# Patient Record
Sex: Male | Born: 1950 | Race: White | Hispanic: No | Smoking: Current every day smoker
Health system: Southern US, Community
[De-identification: ages and names within clinical notes are randomized; demographics above are authoritative.]

## PROBLEM LIST (undated history)

## (undated) DIAGNOSIS — M71122 Other infective bursitis, left elbow: Secondary | ICD-10-CM

## (undated) DIAGNOSIS — I4819 Other persistent atrial fibrillation: Secondary | ICD-10-CM

## (undated) DIAGNOSIS — R0600 Dyspnea, unspecified: Secondary | ICD-10-CM

## (undated) DIAGNOSIS — G4700 Insomnia, unspecified: Secondary | ICD-10-CM

## (undated) DIAGNOSIS — I2510 Atherosclerotic heart disease of native coronary artery without angina pectoris: Secondary | ICD-10-CM

## (undated) DIAGNOSIS — I4891 Unspecified atrial fibrillation: Secondary | ICD-10-CM

## (undated) DIAGNOSIS — I509 Heart failure, unspecified: Secondary | ICD-10-CM

## (undated) DIAGNOSIS — M51369 Other intervertebral disc degeneration, lumbar region without mention of lumbar back pain or lower extremity pain: Secondary | ICD-10-CM

## (undated) DIAGNOSIS — I428 Other cardiomyopathies: Secondary | ICD-10-CM

## (undated) DIAGNOSIS — M5136 Other intervertebral disc degeneration, lumbar region: Secondary | ICD-10-CM

## (undated) DIAGNOSIS — M7030 Other bursitis of elbow, unspecified elbow: Secondary | ICD-10-CM

## (undated) HISTORY — PX: DENTAL EXAMINATION UNDER ANESTHESIA: SHX1447

## (undated) HISTORY — DX: Other cardiomyopathies: I42.8

## (undated) HISTORY — PX: NO PAST SURGERIES: SHX2092

## (undated) HISTORY — DX: Other persistent atrial fibrillation: I48.19

## (undated) HISTORY — DX: Atherosclerotic heart disease of native coronary artery without angina pectoris: I25.10

---
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
## 2015-02-09 ENCOUNTER — Encounter (HOSPITAL_COMMUNITY): Payer: Self-pay | Admitting: *Deleted

## 2015-02-09 ENCOUNTER — Emergency Department (INDEPENDENT_AMBULATORY_CARE_PROVIDER_SITE_OTHER)
Admission: EM | Admit: 2015-02-09 | Discharge: 2015-02-09 | Disposition: A | Discharge status: Home / Self Care | Payer: Self-pay | Source: Home / Self Care | Attending: Family Medicine | Admitting: Family Medicine

## 2015-02-09 DIAGNOSIS — J40 Bronchitis, not specified as acute or chronic: Secondary | ICD-10-CM

## 2015-02-09 DIAGNOSIS — K5900 Constipation, unspecified: Secondary | ICD-10-CM

## 2015-02-09 LAB — OCCULT BLOOD, POC DEVICE: Fecal Occult Bld: NEGATIVE

## 2015-02-09 MED ORDER — PREDNISONE 50 MG PO TABS: 50.0000 mg | ORAL_TABLET | Freq: Every day | ORAL | Status: DC

## 2015-02-09 MED ORDER — POLYETHYLENE GLYCOL 3350 17 GM/SCOOP PO POWD: 17.0000 g | Freq: Every day | ORAL | Status: DC

## 2015-02-09 NOTE — ED Provider Notes (Signed)
Sean Barnett is a 64 y.o. male who presents to Urgent Care today for cough congestion shortness of breath present for 10 days. No vomiting or diarrhea. Patient does note some wheezing. He has not tried any medications. He is stop smoking during the duration of his illness. Recently he's developed nausea and constipation.  Patient has never had a colonoscopy. He has not had a bowel movement in the last week. He has not tried medications   History reviewed. No pertinent past medical history. History reviewed. No pertinent past surgical history. History  Substance Use Topics  . Smoking status: Current Every Day Smoker  . Smokeless tobacco: Not on file  . Alcohol Use: Yes   ROS as above Medications: No current facility-administered medications for this encounter.   Current Outpatient Prescriptions  Medication Sig Dispense Refill  . polyethylene glycol powder (GLYCOLAX/MIRALAX) powder Take 17 g by mouth daily. 850 g 1  . predniSONE (DELTASONE) 50 MG tablet Take 1 tablet (50 mg total) by mouth daily. 5 tablet 0   Allergies  Allergen Reactions  . Sulfa Antibiotics      Exam:  BP 142/87 mmHg  Pulse 58  Temp(Src) 98.2 F (36.8 C) (Oral)  Resp 16  SpO2 97% Gen: Well NAD HEENT: EOMI,  MMM Lungs: Normal work of breathing.Wheezing present bilaterally  Heart: RRR no MRG Abd: NABS, Soft. Nondistended, Nontender Exts: Brisk capillary refill, warm and well perfused.  Rectal exam: Normal appearing anus. No rectal masses or hard stool palpated. Prostate is nontender.  Results for orders placed or performed during the hospital encounter of 02/09/15 (from the past 24 hour(s))  Occult blood, poc device     Status: None   Collection Time: 02/09/15 12:08 PM  Result Value Ref Range   Fecal Occult Bld NEGATIVE NEGATIVE   No results found.  Assessment and Plan: 64 y.o. male with  1) bronchitis: Treat with prednisone. Discontinue smoking 2) constipation:  constipation versus obstruction.  Treat with MiraLAX. Follow-up with GI. Present to ED if worsening.   Discussed warning signs or symptoms. Please see discharge instructions. Patient expresses understanding.     Rodolph Bong, MD 02/09/15 [phone]

## 2015-02-09 NOTE — Discharge Instructions (Signed)
Thank you for coming in today. Call or go to the emergency room if you get worse, have trouble breathing, have chest pains, or palpitations.  If your belly pain worsens, or you have high fever, bad vomiting, blood in your stool or black tarry stool go to the Emergency Room.   Please quit smoking.   Follow up with the stomach doctors and a primary doctor.    Constipation Constipation is when a person has fewer than three bowel movements a week, has difficulty having a bowel movement, or has stools that are dry, hard, or larger than normal. As people grow older, constipation is more common. If you try to fix constipation with medicines that make you have a bowel movement (laxatives), the problem may get worse. Long-term laxative use may cause the muscles of the colon to become weak. A low-fiber diet, not taking in enough fluids, and taking certain medicines may make constipation worse.  CAUSES   Certain medicines, such as antidepressants, pain medicine, iron supplements, antacids, and water pills.   Certain diseases, such as diabetes, irritable bowel syndrome (IBS), thyroid disease, or depression.   Not drinking enough water.   Not eating enough fiber-rich foods.   Stress or travel.   Lack of physical activity or exercise.   Ignoring the urge to have a bowel movement.   Using laxatives too much.  SIGNS AND SYMPTOMS   Having fewer than three bowel movements a week.   Straining to have a bowel movement.   Having stools that are hard, dry, or larger than normal.   Feeling full or bloated.   Pain in the lower abdomen.   Not feeling relief after having a bowel movement.  DIAGNOSIS  Your health care provider will take a medical history and perform a physical exam. Further testing may be done for severe constipation. Some tests may include:  A barium enema X-ray to examine your rectum, colon, and, sometimes, your small intestine.   A sigmoidoscopy to examine your  lower colon.   A colonoscopy to examine your entire colon. TREATMENT  Treatment will depend on the severity of your constipation and what is causing it. Some dietary treatments include drinking more fluids and eating more fiber-rich foods. Lifestyle treatments may include regular exercise. If these diet and lifestyle recommendations do not help, your health care provider may recommend taking over-the-counter laxative medicines to help you have bowel movements. Prescription medicines may be prescribed if over-the-counter medicines do not work.  HOME CARE INSTRUCTIONS   Eat foods that have a lot of fiber, such as fruits, vegetables, whole grains, and beans.  Limit foods high in fat and processed sugars, such as french fries, hamburgers, cookies, candies, and soda.   A fiber supplement may be added to your diet if you cannot get enough fiber from foods.   Drink enough fluids to keep your urine clear or pale yellow.   Exercise regularly or as directed by your health care provider.   Go to the restroom when you have the urge to go. Do not hold it.   Only take over-the-counter or prescription medicines as directed by your health care provider. Do not take other medicines for constipation without talking to your health care provider first.  SEEK IMMEDIATE MEDICAL CARE IF:   You have bright red blood in your stool.   Your constipation lasts for more than 4 days or gets worse.   You have abdominal or rectal pain.   You have thin, pencil-like stools.  You have unexplained weight loss. MAKE SURE YOU:   Understand these instructions.  Will watch your condition.  Will get help right away if you are not doing well or get worse. Document Released: 08/14/2004 Document Revised: 11/21/2013 Document Reviewed: 08/28/2013 [facility] Patient Information 2015 [place], [place]. This information is not intended to replace advice given to you by your health care provider. Make sure you discuss  any questions you have with your health care provider.   Acute Bronchitis Bronchitis is inflammation of the airways that extend from the windpipe into the lungs (bronchi). The inflammation often causes mucus to develop. This leads to a cough, which is the most common symptom of bronchitis.  In acute bronchitis, the condition usually develops suddenly and goes away over time, usually in a couple weeks. Smoking, allergies, and asthma can make bronchitis worse. Repeated episodes of bronchitis may cause further lung problems.  CAUSES Acute bronchitis is most often caused by the same virus that causes a cold. The virus can spread from person to person (contagious) through coughing, sneezing, and touching contaminated objects. SIGNS AND SYMPTOMS   Cough.   Fever.   Coughing up mucus.   Body aches.   Chest congestion.   Chills.   Shortness of breath.   Sore throat.  DIAGNOSIS  Acute bronchitis is usually diagnosed through a physical exam. Your health care provider will also ask you questions about your medical history. Tests, such as chest X-rays, are sometimes done to rule out other conditions.  TREATMENT  Acute bronchitis usually goes away in a couple weeks. Oftentimes, no medical treatment is necessary. Medicines are sometimes given for relief of fever or cough. Antibiotic medicines are usually not needed but may be prescribed in certain situations. In some cases, an inhaler may be recommended to help reduce shortness of breath and control the cough. A cool mist vaporizer may also be used to help thin bronchial secretions and make it easier to clear the chest.  HOME CARE INSTRUCTIONS  Get plenty of rest.   Drink enough fluids to keep your urine clear or pale yellow (unless you have a medical condition that requires fluid restriction). Increasing fluids may help thin your respiratory secretions (sputum) and reduce chest congestion, and it will prevent dehydration.   Take  medicines only as directed by your health care provider.  If you were prescribed an antibiotic medicine, finish it all even if you start to feel better.  Avoid smoking and secondhand smoke. Exposure to cigarette smoke or irritating chemicals will make bronchitis worse. If you are a smoker, consider using nicotine gum or skin patches to help control withdrawal symptoms. Quitting smoking will help your lungs heal faster.   Reduce the chances of another bout of acute bronchitis by washing your hands frequently, avoiding people with cold symptoms, and trying not to touch your hands to your mouth, nose, or eyes.   Keep all follow-up visits as directed by your health care provider.  SEEK MEDICAL CARE IF: Your symptoms do not improve after 1 week of treatment.  SEEK IMMEDIATE MEDICAL CARE IF:  You develop an increased fever or chills.   You have chest pain.   You have severe shortness of breath.  You have bloody sputum.   You develop dehydration.  You faint or repeatedly feel like you are going to pass out.  You develop repeated vomiting.  You develop a severe headache. MAKE SURE YOU:   Understand these instructions.  Will watch your condition.  Will get  help right away if you are not doing well or get worse. Document Released: 12/24/2004 Document Revised: 04/02/2014 Document Reviewed: 05/09/2013 [facility] Patient Information 2015 [place], [place]. This information is not intended to replace advice given to you by your health care provider. Make sure you discuss any questions you have with your health care provider.   PRIMARY CARE Merchant navy officer at [employer] [address]  [place], [place] [phone]  Fax [phone]  Nature conservation officer at [facility] [address]  [place], [place] [phone]  Fax [phone]  Nature conservation officer at [facility] [phone],  [place] [phone]  Fax [phone]  [facility] at [facility] [address]  [place], [place] [phone]  Fax [phone]  [employer] [facility] [address] [place] [address] [place], [place] [phone]  Fax [phone]  [facility] at [facility] [address] [place], [place] [phone]  Fax [phone]   [facility] Medicine @ [place] [address] [place] [address] Phone: [phone]   [facility] Medicine @ [facility] [address] [place] [address] Phone: [phone]   [facility] Medicine @ [place] [address] [place] [address] Phone: [phone]   [facility] Medicine @ [place] [address] [place] [address] Phone: [phone]   [facility] Medicine @ [place] [address] [place] [address] Phone: [phone] Fax: [phone]   [facility] @ [place] [address] [place] [address] Phone: [phone]   Dr. Maryelizabeth Rowan [address] [place] [address] [phone]

## 2015-02-09 NOTE — ED Notes (Signed)
Pt   Reports  About  10 days  Ago  of  Developed   Chills  And diarrhea    -   For  approx  1  wek  He  Has  Nausea   decreased  Appetite            As   Well   As  A  Cough         Pt   States  He  Has  Not  Had  A  bm  In  About  1  Week  As  Well    He  denys  Any  Pain  Except  When he  Coughs     -   He  Is  Sitting  Upright   On  Exam table speaking in  Complete  sentances

## 2016-04-14 ENCOUNTER — Ambulatory Visit (INDEPENDENT_AMBULATORY_CARE_PROVIDER_SITE_OTHER): Payer: Self-pay

## 2016-04-14 ENCOUNTER — Encounter (HOSPITAL_COMMUNITY): Payer: Self-pay | Admitting: *Deleted

## 2016-04-14 ENCOUNTER — Ambulatory Visit (HOSPITAL_COMMUNITY)
Admission: EM | Admit: 2016-04-14 | Discharge: 2016-04-14 | Disposition: A | Discharge status: Home / Self Care | Payer: Self-pay | Attending: Family Medicine | Admitting: Family Medicine

## 2016-04-14 DIAGNOSIS — M5137 Other intervertebral disc degeneration, lumbosacral region: Secondary | ICD-10-CM

## 2016-04-14 MED ORDER — MELOXICAM 7.5 MG PO TABS: 7.5000 mg | ORAL_TABLET | Freq: Two times a day (BID) | ORAL | Status: DC

## 2016-04-14 NOTE — Discharge Instructions (Signed)
Use medicine as prescribed and see dr Ophelia Charter for further back care and eval.

## 2016-04-14 NOTE — ED Notes (Signed)
Pt  Reports  Pain  r  Hip  radaiting  Down  r     Leg     Pt   Reports        He  Slept  On  An  Ice  Pack  sev  Days  Ago  And  That  May he  aggrevated  The  symptms     pt  denys  Falling    Or  Any  specefic  Traumatic  Injury      Pt  Is  Able  To  Ambulate         Pt  Has  No   Primary  Care  Provider

## 2016-04-14 NOTE — ED Provider Notes (Signed)
CSN: [id number]     Arrival date & time 04/14/16  1310 History   First MD Initiated Contact with Patient 04/14/16 1326     Chief Complaint  Patient presents with  . Hip Pain   (Consider location/radiation/quality/duration/timing/severity/associated sxs/prior Treatment) Patient is a 65 y.o. male presenting with back pain. The history is provided by the patient.  Back Pain Location:  Lumbar spine Quality:  Shooting Radiates to:  R thigh Pain severity:  Mild Onset quality:  Gradual Duration:  4 days Progression:  Unchanged Chronicity:  New Context: twisting   Relieved by:  None tried Worsened by:  Nothing tried Ineffective treatments:  None tried Associated symptoms: leg pain and weakness   Associated symptoms: no abdominal pain, no bladder incontinence, no bowel incontinence, no dysuria, no fever, no numbness, no paresthesias, no tingling and no weight loss     History reviewed. No pertinent past medical history. History reviewed. No pertinent past surgical history. History reviewed. No pertinent family history. Social History  Substance Use Topics  . Smoking status: Current Every Day Smoker  . Smokeless tobacco: None  . Alcohol Use: Yes    Review of Systems  Constitutional: Negative for fever and weight loss.  Gastrointestinal: Negative.  Negative for abdominal pain and bowel incontinence.  Genitourinary: Negative.  Negative for bladder incontinence and dysuria.  Musculoskeletal: Positive for back pain. Negative for joint swelling and gait problem.  Neurological: Positive for weakness. Negative for tingling, numbness and paresthesias.  All other systems reviewed and are negative.   Allergies  Sulfa antibiotics  Home Medications   Prior to Admission medications   Medication Sig Start Date End Date Taking? Authorizing Provider  meloxicam (MOBIC) 7.5 MG tablet Take 1 tablet (7.5 mg total) by mouth 2 (two) times daily after a meal. 04/14/16   Linna Hoff, MD   polyethylene glycol powder (GLYCOLAX/MIRALAX) powder Take 17 g by mouth daily. 02/09/15   Rodolph Bong, MD  predniSONE (DELTASONE) 50 MG tablet Take 1 tablet (50 mg total) by mouth daily. 02/09/15   Rodolph Bong, MD   Meds Ordered and Administered this Visit  Medications - No data to display  BP 152/99 mmHg  Pulse 53  Temp(Src) 97.8 F (36.6 C) (Oral)  Resp 20  SpO2 97% No data found.   Physical Exam  Constitutional: He is oriented to person, place, and time. He appears well-developed and well-nourished. No distress.  Neck: Normal range of motion. Neck supple.  Abdominal: Soft. Bowel sounds are normal. There is no tenderness.  Musculoskeletal: He exhibits tenderness.  Pain and weakness right ant thigh v. Left.  Lymphadenopathy:    He has no cervical adenopathy.  Neurological: He is alert and oriented to person, place, and time. No cranial nerve deficit. Coordination normal.  Skin: Skin is warm and dry.  Nursing note and vitals reviewed.   ED Course  Procedures (including critical care time)  Labs Review Labs Reviewed - No data to display  Imaging Review Dg Lumbar Spine Complete  04/14/2016  CLINICAL DATA:  Lumbago with radicular symptoms to the right lower extremity EXAM: LUMBAR SPINE - COMPLETE 4+ VIEW COMPARISON:  None. FINDINGS: Frontal, lateral, spot lumbosacral lateral, and bilateral oblique views were obtained. There are 5 non-rib-bearing lumbar type vertebral bodies. There is no fracture or spondylolisthesis. There is moderately severe disc space narrowing at L5-S1. There is moderate narrowing at L1-2 and L2-3. There is mild disc space narrowing at L3-4 and L4-5. There is facet osteoarthritic change  to varying degrees at all levels, most notably at L4-5 and L5-S1 bilaterally. There is atherosclerotic calcification in the aorta. There appears to be mild widening of the mid aorta, possibly representing mild aneurysmal dilatation in this area. IMPRESSION: Multilevel  arthropathy. Disc space narrowing is most marked at L5-S1. No fracture or spondylolisthesis. Atherosclerotic calcification noted. Question mild widening in the mid aorta. This finding may warrant ultrasound of the aorta to further evaluate for possible degree of aneurysmal dilatation in the aorta. Electronically Signed   By: Bretta Bang III M.D.   On: 04/14/2016 14:19   X-rays reviewed and report per radiologist.   Visual Acuity Review  Right Eye Distance:   Left Eye Distance:   Bilateral Distance:    Right Eye Near:   Left Eye Near:    Bilateral Near:         MDM   1. DDD (degenerative disc disease), lumbosacral        Linna Hoff, MD 04/14/16 1426

## 2019-03-01 DIAGNOSIS — M7030 Other bursitis of elbow, unspecified elbow: Secondary | ICD-10-CM

## 2019-03-01 HISTORY — DX: Other bursitis of elbow, unspecified elbow: M70.30

## 2019-03-24 ENCOUNTER — Inpatient Hospital Stay (HOSPITAL_COMMUNITY): Payer: Medicaid Other

## 2019-03-24 ENCOUNTER — Inpatient Hospital Stay (HOSPITAL_COMMUNITY)
Admission: EM | Admit: 2019-03-24 | Discharge: 2019-03-26 | DRG: 309 | Disposition: A | Discharge status: Home / Self Care | Payer: Medicaid Other | Attending: Internal Medicine | Admitting: Internal Medicine

## 2019-03-24 ENCOUNTER — Encounter (HOSPITAL_COMMUNITY): Payer: Self-pay | Admitting: Emergency Medicine

## 2019-03-24 ENCOUNTER — Ambulatory Visit (HOSPITAL_COMMUNITY)
Admission: EM | Admit: 2019-03-24 | Discharge: 2019-03-24 | Disposition: A | Discharge status: Home / Self Care | Payer: Self-pay

## 2019-03-24 ENCOUNTER — Emergency Department (HOSPITAL_COMMUNITY): Payer: Medicaid Other

## 2019-03-24 ENCOUNTER — Other Ambulatory Visit: Payer: Self-pay

## 2019-03-24 DIAGNOSIS — R000 Tachycardia, unspecified: Secondary | ICD-10-CM

## 2019-03-24 DIAGNOSIS — B9561 Methicillin susceptible Staphylococcus aureus infection as the cause of diseases classified elsewhere: Secondary | ICD-10-CM | POA: Diagnosis not present

## 2019-03-24 DIAGNOSIS — Z7952 Long term (current) use of systemic steroids: Secondary | ICD-10-CM

## 2019-03-24 DIAGNOSIS — M7989 Other specified soft tissue disorders: Secondary | ICD-10-CM

## 2019-03-24 DIAGNOSIS — F17210 Nicotine dependence, cigarettes, uncomplicated: Secondary | ICD-10-CM | POA: Diagnosis present

## 2019-03-24 DIAGNOSIS — I110 Hypertensive heart disease with heart failure: Secondary | ICD-10-CM | POA: Diagnosis present

## 2019-03-24 DIAGNOSIS — Z598 Other problems related to housing and economic circumstances: Secondary | ICD-10-CM

## 2019-03-24 DIAGNOSIS — Z881 Allergy status to other antibiotic agents status: Secondary | ICD-10-CM | POA: Diagnosis not present

## 2019-03-24 DIAGNOSIS — I4891 Unspecified atrial fibrillation: Principal | ICD-10-CM | POA: Diagnosis present

## 2019-03-24 DIAGNOSIS — I5020 Unspecified systolic (congestive) heart failure: Secondary | ICD-10-CM | POA: Diagnosis present

## 2019-03-24 DIAGNOSIS — R0602 Shortness of breath: Secondary | ICD-10-CM | POA: Diagnosis not present

## 2019-03-24 DIAGNOSIS — Z800 Family history of malignant neoplasm of digestive organs: Secondary | ICD-10-CM | POA: Diagnosis not present

## 2019-03-24 DIAGNOSIS — R079 Chest pain, unspecified: Secondary | ICD-10-CM | POA: Diagnosis not present

## 2019-03-24 DIAGNOSIS — M7032 Other bursitis of elbow, left elbow: Secondary | ICD-10-CM

## 2019-03-24 DIAGNOSIS — Z79899 Other long term (current) drug therapy: Secondary | ICD-10-CM

## 2019-03-24 DIAGNOSIS — M71122 Other infective bursitis, left elbow: Secondary | ICD-10-CM

## 2019-03-24 DIAGNOSIS — I340 Nonrheumatic mitral (valve) insufficiency: Secondary | ICD-10-CM | POA: Diagnosis not present

## 2019-03-24 DIAGNOSIS — Z720 Tobacco use: Secondary | ICD-10-CM | POA: Diagnosis not present

## 2019-03-24 DIAGNOSIS — I10 Essential (primary) hypertension: Secondary | ICD-10-CM

## 2019-03-24 DIAGNOSIS — Z7901 Long term (current) use of anticoagulants: Secondary | ICD-10-CM | POA: Diagnosis not present

## 2019-03-24 HISTORY — DX: Other bursitis of elbow, unspecified elbow: M70.30

## 2019-03-24 HISTORY — DX: Unspecified atrial fibrillation: I48.91

## 2019-03-24 HISTORY — DX: Other intervertebral disc degeneration, lumbar region without mention of lumbar back pain or lower extremity pain: M51.369

## 2019-03-24 HISTORY — DX: Other intervertebral disc degeneration, lumbar region: M51.36

## 2019-03-24 LAB — CBC WITH DIFFERENTIAL/PLATELET
Abs Immature Granulocytes: 0.04 10*3/uL (ref 0.00–0.07)
Basophils Absolute: 0.1 10*3/uL (ref 0.0–0.1)
Basophils Relative: 1 %
Eosinophils Absolute: 0.4 10*3/uL (ref 0.0–0.5)
Eosinophils Relative: 4 %
HCT: 41.2 % (ref 39.0–52.0)
Hemoglobin: 13.1 g/dL (ref 13.0–17.0)
Immature Granulocytes: 0 %
Lymphocytes Relative: 20 %
Lymphs Abs: 2.1 10*3/uL (ref 0.7–4.0)
MCH: 29.4 pg (ref 26.0–34.0)
MCHC: 31.8 g/dL (ref 30.0–36.0)
MCV: 92.4 fL (ref 80.0–100.0)
Monocytes Absolute: 0.5 10*3/uL (ref 0.1–1.0)
Monocytes Relative: 5 %
Neutro Abs: 7.2 10*3/uL (ref 1.7–7.7)
Neutrophils Relative %: 70 %
Platelets: 332 10*3/uL (ref 150–400)
RBC: 4.46 MIL/uL (ref 4.22–5.81)
RDW: 15 % (ref 11.5–15.5)
WBC: 10.3 10*3/uL (ref 4.0–10.5)
nRBC: 0 % (ref 0.0–0.2)

## 2019-03-24 LAB — COMPREHENSIVE METABOLIC PANEL
ALT: 30 U/L (ref 0–44)
AST: 28 U/L (ref 15–41)
Albumin: 3.6 g/dL (ref 3.5–5.0)
Alkaline Phosphatase: 134 U/L — ABNORMAL HIGH (ref 38–126)
Anion gap: 14 (ref 5–15)
BUN: 14 mg/dL (ref 8–23)
CO2: 21 mmol/L — ABNORMAL LOW (ref 22–32)
Calcium: 9.2 mg/dL (ref 8.9–10.3)
Chloride: 104 mmol/L (ref 98–111)
Creatinine, Ser: 1.14 mg/dL (ref 0.61–1.24)
GFR calc Af Amer: >60 mL/min (ref >60)
GFR calc non Af Amer: >60 mL/min (ref >60)
Glucose, Bld: 117 mg/dL — ABNORMAL HIGH (ref 70–99)
Potassium: 4.4 mmol/L (ref 3.5–5.1)
Sodium: 139 mmol/L (ref 135–145)
Total Bilirubin: 1.1 mg/dL (ref 0.3–1.2)
Total Protein: 8.1 g/dL (ref 6.5–8.1)

## 2019-03-24 LAB — MRSA PCR SCREENING: MRSA by PCR: NEGATIVE

## 2019-03-24 LAB — BRAIN NATRIURETIC PEPTIDE: B Natriuretic Peptide: 756.6 pg/mL — ABNORMAL HIGH (ref 0.0–100.0)

## 2019-03-24 LAB — TROPONIN I: Troponin I: <0.03 ng/mL (ref <0.03)

## 2019-03-24 LAB — TSH: TSH: 2.45 u[IU]/mL (ref 0.350–4.500)

## 2019-03-24 MED ORDER — ACETAMINOPHEN 650 MG RE SUPP: 650.0000 mg | Freq: Four times a day (QID) | RECTAL | Status: DC | PRN

## 2019-03-24 MED ORDER — HEPARIN BOLUS VIA INFUSION
5500.0000 [IU] | Freq: Once | INTRAVENOUS | Status: DC
  Filled 2019-03-24: qty 5500

## 2019-03-24 MED ORDER — APIXABAN 5 MG PO TABS
5.0000 mg | ORAL_TABLET | Freq: Two times a day (BID) | ORAL | Status: DC
  Administered 2019-03-25 – 2019-03-26 (×3): 5 mg via ORAL
  Filled 2019-03-24 (×4): qty 1

## 2019-03-24 MED ORDER — CEFAZOLIN SODIUM-DEXTROSE 2-4 GM/100ML-% IV SOLN
2.0000 g | Freq: Three times a day (TID) | INTRAVENOUS | Status: DC
  Administered 2019-03-24 – 2019-03-26 (×5): 2 g via INTRAVENOUS
  Filled 2019-03-24 (×9): qty 100

## 2019-03-24 MED ORDER — DILTIAZEM LOAD VIA INFUSION
10.0000 mg | Freq: Once | INTRAVENOUS | Status: AC
  Administered 2019-03-24: 10 mg via INTRAVENOUS
  Filled 2019-03-24: qty 10

## 2019-03-24 MED ORDER — FUROSEMIDE 10 MG/ML IJ SOLN: 20.0000 mg | Freq: Once | INTRAMUSCULAR | Status: DC

## 2019-03-24 MED ORDER — SODIUM CHLORIDE 0.9% FLUSH
3.0000 mL | Freq: Two times a day (BID) | INTRAVENOUS | Status: DC
  Administered 2019-03-25 – 2019-03-26 (×3): 3 mL via INTRAVENOUS

## 2019-03-24 MED ORDER — POLYETHYLENE GLYCOL 3350 17 G PO PACK: 17.0000 g | PACK | Freq: Every day | ORAL | Status: DC | PRN

## 2019-03-24 MED ORDER — DILTIAZEM HCL-DEXTROSE 100-5 MG/100ML-% IV SOLN (PREMIX)
5.0000 mg/h | INTRAVENOUS | Status: DC
  Administered 2019-03-24: 15 mg/h via INTRAVENOUS
  Administered 2019-03-24: 10 mg/h via INTRAVENOUS
  Administered 2019-03-24: 5 mg/h via INTRAVENOUS
  Administered 2019-03-24 – 2019-03-25 (×2): 15 mg/h via INTRAVENOUS
  Filled 2019-03-24 (×3): qty 100

## 2019-03-24 MED ORDER — ACETAMINOPHEN 325 MG PO TABS: 650.0000 mg | ORAL_TABLET | Freq: Four times a day (QID) | ORAL | Status: DC | PRN

## 2019-03-24 MED ORDER — HEPARIN (PORCINE) 25000 UT/250ML-% IV SOLN: 1500.0000 [IU]/h | INTRAVENOUS | Status: DC

## 2019-03-24 NOTE — ED Triage Notes (Addendum)
Pt presents to [facility] for assessment of large area of redness, swelling and pain to the left elbow x 1 week

## 2019-03-24 NOTE — ED Provider Notes (Signed)
[facility] EMERGENCY DEPARTMENT Provider Note   CSN: [id number] Arrival date & time: 03/24/19  2197    History   Chief Complaint Chief Complaint  Patient presents with  . Shortness of Breath  . Wound Infection    left elbow    HPI Sean Barnett is a 68 y.o. male.     HPI Patient sent from urgent care.  Has had left elbow pain and swelling over the last around 11 days.  States he thinks he hit it on something initially.  However he had had swelling of his left forearm 2.  Now the left elbow is been draining for the last 3 or 4 days.  Has had purulent drainage.  States the swelling of the forearm is better however. Has had more shortness of breath.  Normally sleeps on his belly and states he is been having more problems with that.  States her last couple days has felt his heart going fast although he has had issues with his heart going fast in the past who is felt episodes over this over the last year.  He is a smoker.  No chest pain.  Does have swelling in his legs which he has had for years also.  No weight loss.  Does not see a doctor.  High blood pressure at the urgent care along with an irregular tachycardia.  No difficulty moving the left elbow. History reviewed. No pertinent past medical history.  There are no active problems to display for this patient.   No past surgical history on file.      Home Medications    Prior to Admission medications   Medication Sig Start Date End Date Taking? Authorizing Provider  meloxicam (MOBIC) 7.5 MG tablet Take 1 tablet (7.5 mg total) by mouth 2 (two) times daily after a meal. 04/14/16   Kindl, Quita Skye, MD  polyethylene glycol powder (GLYCOLAX/MIRALAX) powder Take 17 g by mouth daily. 02/09/15   Rodolph Bong, MD  predniSONE (DELTASONE) 50 MG tablet Take 1 tablet (50 mg total) by mouth daily. 02/09/15   Rodolph Bong, MD    Family History History reviewed. No pertinent family history.  Social History Social  History   Tobacco Use  . Smoking status: Current Every Day Smoker    Packs/day: 1.00  . Smokeless tobacco: Never Used  Substance Use Topics  . Alcohol use: Yes  . Drug use: Not on file     Allergies   Sulfa antibiotics   Review of Systems Review of Systems  Constitutional: Negative for appetite change.  HENT: Negative for congestion.   Respiratory: Positive for shortness of breath. Negative for cough.   Cardiovascular: Positive for leg swelling.  Gastrointestinal: Negative for abdominal pain.  Genitourinary: Negative for flank pain.  Musculoskeletal: Negative for gait problem.       Left elbow swelling.  Had had left forearm swelling.  Neurological: Negative for weakness.     Physical Exam Updated Vital Signs BP (!) 162/126 (BP Location: Right Arm)   Pulse (!) 137   Temp 97.9 F (36.6 C) (Oral)   Resp 14   Ht 6\' 4"  (1.93 m)   Wt 113.4 kg   SpO2 100%   BMI 30.43 kg/m   Physical Exam Vitals signs and nursing note reviewed.  HENT:     Head: Normocephalic.  Cardiovascular:     Comments: Irregular tachycardia Pulmonary:     Comments: Harsh breath sounds at the bases. Chest:  Chest wall: No tenderness.  Abdominal:     Tenderness: There is no abdominal tenderness.  Musculoskeletal:     Right lower leg: Edema present.     Left lower leg: Edema present.     Comments: Left elbow erythema induration and swelling.  There is an area with purulent drainage.  Some fluctuance.  Pitting edema bilateral lower extremities.  Good range of motion and movement in left elbow.  No edema distally.  Skin:    Capillary Refill: Capillary refill takes less than 2 seconds.  Neurological:     General: No focal deficit present.     Mental Status: He is alert.        ED Treatments / Results  Labs (all labs ordered are listed, but only abnormal results are displayed) Labs Reviewed  COMPREHENSIVE METABOLIC PANEL - Abnormal; Notable for the following components:      Result  Value   CO2 21 (*)    Glucose, Bld 117 (*)    Alkaline Phosphatase 134 (*)    All other components within normal limits  AEROBIC CULTURE (SUPERFICIAL SPECIMEN)  TROPONIN I  CBC WITH DIFFERENTIAL/PLATELET  TSH  BRAIN NATRIURETIC PEPTIDE    EKG EKG Interpretation  Date/Time:  Friday March 24 2019 09:47:01 EDT Ventricular Rate:  142 PR Interval:    QRS Duration: 92 QT Interval:  317 QTC Calculation: 488 R Axis:   -88 Text Interpretation:  Atrial fibrillation Left anterior fascicular block Consider right ventricular hypertrophy Nonspecific T abnormalities, lateral leads Borderline prolonged QT interval Confirmed by Benjiman Core [phone]) on 03/24/2019 10:03:34 AM   Radiology Dg Chest Portable 1 View  Result Date: 03/24/2019 CLINICAL DATA:  Shortness of breath and chest pain EXAM: PORTABLE CHEST 1 VIEW COMPARISON:  None. FINDINGS: Cardiac shadow is at the upper limits of normal in size. Lungs are well aerated bilaterally. Patchy increased density is noted in the medial aspect of the right upper lobe consistent with early infiltrate. Mild vascular congestion is noted without interstitial edema. IMPRESSION: Mild vascular congestion. Increased density in the right upper lobe medially consistent with early infiltrate. Electronically Signed   By: Alcide Clever M.D.   On: 03/24/2019 11:09   Ue Venous Duplex ([facility] Only)  Result Date: 03/24/2019 UPPER VENOUS STUDY  Indications: Swelling Performing Technologist: Blanch Media RVS  Examination Guidelines: A complete evaluation includes B-mode imaging, spectral Doppler, color Doppler, and power Doppler as needed of all accessible portions of each vessel. Bilateral testing is considered an integral part of a complete examination. Limited examinations for reoccurring indications may be performed as noted.  Right Findings: +----------+------------+---------+-----------+----------+-------+ RIGHT      CompressiblePhasicitySpontaneousPropertiesSummary +----------+------------+---------+-----------+----------+-------+ Subclavian    Full       Yes       Yes                      +----------+------------+---------+-----------+----------+-------+  Left Findings: +----------+------------+---------+-----------+----------+-------+ LEFT      CompressiblePhasicitySpontaneousPropertiesSummary +----------+------------+---------+-----------+----------+-------+ IJV           Full       Yes       Yes                      +----------+------------+---------+-----------+----------+-------+ Subclavian    Full       Yes       Yes                      +----------+------------+---------+-----------+----------+-------+ Axillary  Full       Yes       Yes                      +----------+------------+---------+-----------+----------+-------+ Brachial      Full       Yes       Yes                      +----------+------------+---------+-----------+----------+-------+ Radial        Full                                          +----------+------------+---------+-----------+----------+-------+ Ulnar         Full                                          +----------+------------+---------+-----------+----------+-------+ Cephalic      Full                                          +----------+------------+---------+-----------+----------+-------+ Basilic       Full                                          +----------+------------+---------+-----------+----------+-------+  Summary:  Right: No evidence of thrombosis in the subclavian.  Left: No evidence of deep vein thrombosis in the upper extremity. No evidence of superficial vein thrombosis in the upper extremity.  *See table(s) above for measurements and observations.    Preliminary     Procedures Procedures (including critical care time)  Medications Ordered in ED Medications  ceFAZolin (ANCEF) IVPB 2g/100  mL premix (has no administration in time range)     Initial Impression / Assessment and Plan / ED Course  I have reviewed the triage vital signs and the nursing notes.  Pertinent labs & imaging results that were available during my care of the patient were reviewed by me and considered in my medical decision making (see chart for details).       Chadsvasc of 2 for age and hypertension, although patient does not see a doctor and could easily have higher risk.   Patient presented with left elbow infection.  Appears to be septic bursitis.  Seen by Ortho and consultation given.  Antibiotics and a culture has been done.  However patient also was found to be in A. fib with RVR.  New onset.  X-ray showed potential pneumonia but does not have current cough although he did have a cough couple weeks ago.  Also has some mild vascular congestion on the x-ray.  His edema on the legs.  Will start Cardizem drip for rate control.  Admit to unassigned medicine.   CRITICAL CARE Performed by: Benjiman CoreNathan Miquel Stacks Total critical care time: 30 minutes Critical care time was exclusive of separately billable procedures and treating other patients. Critical care was necessary to treat or prevent imminent or life-threatening deterioration. Critical care was time spent personally by me on the following activities: development of treatment plan with patient and/or surrogate as well as nursing, discussions with consultants, evaluation of  patient's response to treatment, examination of patient, obtaining history from patient or surrogate, ordering and performing treatments and interventions, ordering and review of laboratory studies, ordering and review of radiographic studies, pulse oximetry and re-evaluation of patient's condition.   Final Clinical Impressions(s) / ED Diagnoses   Final diagnoses:  Atrial fibrillation with rapid ventricular response (HCC)  Septic bursitis of elbow, left    ED Discharge Orders     None       Benjiman Core, MD 03/24/19 1204

## 2019-03-24 NOTE — Progress Notes (Signed)
Paged provider about patient BP consistently elevated since admission. Awaiting orders. Provider return page and advise to continue to monitor patient BP. Possible would add additional BP medications.  RN will continue to monitor

## 2019-03-24 NOTE — ED Triage Notes (Signed)
Pt arrvies from home complaining of SOB  And elbow infection. Pt states a week ago he felt like he had bruised his elbow but it had blistered up and the skin broke. Pt sent from urgent care.

## 2019-03-24 NOTE — Progress Notes (Signed)
ANTICOAGULATION CONSULT NOTE - Initial Consult  Pharmacy Consult for heparin Indication: atrial fibrillation  Allergies  Allergen Reactions  . Sulfa Antibiotics     Patient Measurements: Height: 6\' 4"  (193 cm) Weight: 250 lb (113.4 kg) IBW/kg (Calculated) : 86.8 Heparin Dosing Weight: 110kg  Vital Signs: Temp: 97.9 F (36.6 C) (04/24 0946) Temp Source: Oral (04/24 0946) BP: 149/127 (04/24 1245) Pulse Rate: 120 (04/24 1245)  Labs: Recent Labs    03/24/19 1012  HGB 13.1  HCT 41.2  PLT 332  CREATININE 1.14  TROPONINI <0.03    Estimated Creatinine Clearance: 85.4 mL/min (by C-G formula based on SCr of 1.14 mg/dL).   Medical History: History reviewed. No pertinent past medical history.  Medications:  Infusions:  .  ceFAZolin (ANCEF) IV Stopped (03/24/19 1242)  . diltiazem (CARDIZEM) infusion 5 mg/hr (03/24/19 1248)  . heparin      Assessment: 81 yom presented to the ED with SOB and elbow infection. Found to be in afib. To start IV heparin for anticoagulation. Baseline CBC is WNL and he is not on anticoagulation PTA.   Goal of Therapy:  Heparin level 0.3-0.7 units/ml Monitor platelets by anticoagulation protocol: Yes   Plan:  Heparin bolus 5500 units IV x 1 Heparin gtt 1500 units/hr Check a 6 hr heparin level Daily heparin level and CBC  Kinslee Dalpe, Drake Leach 03/24/2019,1:04 PM

## 2019-03-24 NOTE — Progress Notes (Signed)
Heart rate elevated.  Will attempt echo later once HR stabilizes.

## 2019-03-24 NOTE — ED Notes (Signed)
After assessment by NP, patient to be sent to ER

## 2019-03-24 NOTE — ED Provider Notes (Signed)
[facility]    CSN: [id number] Arrival date & time: 03/24/19  0848     History   Chief Complaint Chief Complaint  Patient presents with  . Arm Pain    HPI Kaylup Knable is a 68 y.o. male.   Patient is a 68 year old male with no known past medical history.  He presents today with approximately 1 week or more of worsening left elbow pain, swelling and draining.  Symptoms have been persistent.  Patient reports that with his occupation he is on his elbows a lot.  He noticed some pain earlier in the week but did not think anything about it.  He has been taking ibuprofen for the pain.  Unsure if any fevers at home.  Also reporting that he has had some shortness of breath and intermittent chest pains over the last 3 days.  He is a current every day smoker.  Denies any recent long distance traveling.  No history of PE or DVT.  Denies any calf pain.  He also mentions that he has had some increase in heart rate that has been coming and going over the last year.  ROS per HPI      History reviewed. No pertinent past medical history.  There are no active problems to display for this patient.   History reviewed. No pertinent surgical history.     Home Medications    Prior to Admission medications   Medication Sig Start Date End Date Taking? Authorizing Provider  meloxicam (MOBIC) 7.5 MG tablet Take 1 tablet (7.5 mg total) by mouth 2 (two) times daily after a meal. 04/14/16   Kindl, Quita Skye, MD  polyethylene glycol powder (GLYCOLAX/MIRALAX) powder Take 17 g by mouth daily. 02/09/15   Rodolph Bong, MD  predniSONE (DELTASONE) 50 MG tablet Take 1 tablet (50 mg total) by mouth daily. 02/09/15   Rodolph Bong, MD    Family History History reviewed. No pertinent family history.  Social History Social History   Tobacco Use  . Smoking status: Current Every Day Smoker    Packs/day: 1.00  . Smokeless tobacco: Never Used  Substance Use Topics  . Alcohol use: Yes  . Drug use:  Not on file     Allergies   Sulfa antibiotics   Review of Systems Review of Systems   Physical Exam Triage Vital Signs ED Triage Vitals  Enc Vitals Group     BP 03/24/19 0901 (!) 170/137     Pulse Rate 03/24/19 0901 (!) 127     Resp 03/24/19 0901 20     Temp 03/24/19 0901 97.7 F (36.5 C)     Temp Source 03/24/19 0901 Oral     SpO2 03/24/19 0901 95 %     Weight --      Height --      Head Circumference --      Peak Flow --      Pain Score 03/24/19 0907 1     Pain Loc --      Pain Edu? --      Excl. in GC? --    No data found.  Updated Vital Signs BP (!) 170/137 (BP Location: Right Wrist)   Pulse (!) 127   Temp 97.7 F (36.5 C) (Oral)   Resp 20   SpO2 95%   Visual Acuity Right Eye Distance:   Left Eye Distance:   Bilateral Distance:    Right Eye Near:   Left Eye Near:    Bilateral  Near:     Physical Exam Vitals signs and nursing note reviewed.  Constitutional:      General: He is not in acute distress.    Appearance: Normal appearance. He is not ill-appearing, toxic-appearing or diaphoretic.  HENT:     Head: Normocephalic and atraumatic.     Nose: Nose normal.  Eyes:     Conjunctiva/sclera: Conjunctivae normal.  Neck:     Musculoskeletal: Normal range of motion.  Cardiovascular:     Rate and Rhythm: Tachycardia present. Rhythm irregular.  Pulmonary:     Effort: Pulmonary effort is normal. No respiratory distress.     Breath sounds: Normal breath sounds.  Musculoskeletal: Normal range of motion.        General: Swelling and tenderness present.  Skin:    General: Skin is warm and dry.     Comments: See picture for detail.   Neurological:     Mental Status: He is alert.  Psychiatric:        Mood and Affect: Mood normal.        UC Treatments / Results  Labs (all labs ordered are listed, but only abnormal results are displayed) Labs Reviewed - No data to display  EKG None  Radiology No results found.  Procedures Procedures  (including critical care time)  Medications Ordered in UC Medications - No data to display  Initial Impression / Assessment and Plan / UC Course  I have reviewed the triage vital signs and the nursing notes.  Pertinent labs & imaging results that were available during my care of the patient were reviewed by me and considered in my medical decision making (see chart for details).    Elbow bursitis, SOB, tachy  Patient is a 68 year old male with no known medical history.  He presents today with infection to the left elbow. My concern today is for possibility of septic bursitis  Other differentials include septic joint Patient is tachycardic with an irregular heart rate and multiple elevated BP readings.  He is a heavy smoker and has been having shortness of breath and intermittent chest pains over last 3 days. Worried that patient is possibly septic He has been taking ibuprofen which could be covering up a fever.  Pt is not well and has a multitude of things going on that needs to be evaluated in the ED.  Sending down for further management.  Pt understanding and agreed.  CMA wheeling him down.  Final Clinical Impressions(s) / UC Diagnoses   Final diagnoses:  Bursitis of left elbow, unspecified bursa  Tachycardia  SOB (shortness of breath)     Discharge Instructions     Please go to the ER for further evaluation of the infection in your arm, increased heart rate and elevated blood pressure    ED Prescriptions    None     Controlled Substance Prescriptions [place] Controlled Substance Registry consulted? Not Applicable   Janace Aris, NP 03/24/19 1011

## 2019-03-24 NOTE — H&P (Signed)
Date: 03/24/2019               Patient Name:  Sean Barnett MRN: [id number]  DOB: [birth year] Age / Sex: 68 y.o., male   PCP: Patient, No Pcp Per         Medical Service: Internal Medicine Teaching Service         Attending Physician: Dr. Rogelia Boga, Austin Miles, MD    First Contact: Dr. Petra Kuba Pager: [phone]  Second Contact: Dr. Alinda Money Pager: [phone]       After Hours (After 5p/  First Contact Pager: [phone]  weekends / holidays): Second Contact Pager: [phone]   Chief Complaint: left elbow pain, sob  History of Present Illness: Mr. Estridge is a 68yo man with no known PMHx who presents from urgent care for evaluation of infected left elbow joint and a.fib in RVR. 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 10 days ago he noted pain in his left elbow but did not think much of it. Over the next few days, he became swollen, red, hot, and started draining purulent fluid. Ibuprofen as helped with the pain and swelling. He denies preceding trauma or bug bites but states that he was bitten by a brown recluse 45 years ago and was worried this may have happened again. He denies fevers, chills, n/v/d.   He also endorses palpitations and chest pressure for the past couple days. He reports several episodes of similar symptoms over the past several months. He usually sleeps on his stomach but when he tries that position, he describes a feeling of being trapped in a very tight space. He has started sleeping in a recliner. He denies chest pain or chronic sob. He does note slight swelling of lower extremities appreciated by a sock line whenever he wears socks.    Meds:  ibuprofen prn    Allergies: Allergies as of 03/24/2019 - Review Complete 03/24/2019  Allergen Reaction Noted  . Sulfa antibiotics  02/09/2015   History reviewed. No pertinent past medical history.  Family History: Brother with colon cancer.   Social History: Widowed 11 years ago. Works as a Music therapist  doing remodeling projects. Lives in 2 bedroom apartment with his brother. Smokes 1 or more packs per day. Rare alcohol use. No other substance use.   Review of Systems: A complete ROS was negative except as per HPI.   Physical Exam: Blood pressure (!) 152/140, pulse (!) 59, temperature 97.9 F (36.6 C), temperature source Oral, resp. rate 20, height 6\' 4"  (1.93 m), weight 113.4 kg, SpO2 97 %. Vitals:   03/24/19 1230 03/24/19 1245 03/24/19 1300 03/24/19 1315  BP: (!) 162/133 (!) 149/127 (!) 154/122 (!) 152/140  Pulse: (!) 117 (!) 120 (!) 59   Resp: (!) 23 20 (!) 25 20  Temp:      TempSrc:      SpO2: 99% 97% 97%   Weight:      Height:       General: Vital signs reviewed.  Patient is well-developed and well-nourished, in no acute distress and cooperative with exam.  Head: Normocephalic and atraumatic. Eyes: EOMI, conjunctivae normal, no scleral icterus.  Neck: Supple, trachea midline, normal ROM, no JVD, masses, thyromegaly, or carotid bruit present.  Cardiovascular: irregularly irregular, tachycardic, S1 normal, S2 normal, no murmurs, gallops, or rubs. Pulmonary/Chest: diminished breath sounds but Clear to auscultation bilaterally, no wheezes, rales, or rhonchi. Normal wob on RA Abdominal: Soft, non-tender, non-distended, BS +, no  masses, organomegaly, or guarding present.  Musculoskeletal: Left elbow pictured below - TTP, swollen, red, hot with purulent drainage Extremities: Trace lower extremity edema bilaterally around the ankles, feet are cool, strong DP pulses bilaterally Neurological: A&O x3, Strength is normal and symmetric bilaterally, cranial nerve II-XII are grossly intact, no focal motor deficit, sensory intact to light touch bilaterally.  Psychiatric: Normal mood and affect. speech and behavior is normal. Cognition and memory are normal.    EKG: personally reviewed my interpretation is a. Fib RVR 142bpm    CXR: personally reviewed my interpretation is mild vascular  congestion, increased density in RUL concerning for early infiltrate   Assessment & Plan by Problem: Active Problems:   Atrial fibrillation with RVR (HCC)  68yoM with no known PMHx presenting with an infected left elbow joint and palpitations found to be in a. Fib with RVR.   A. Fib with RVR: - reports intermittent palpitations over the last year - Possible etiologies include obesity, HTN, CHF, OSA, diabetes.  - TSH normal  - No excessive caffeine or alcohol use  - CHADsVASC 2, will need further risk factor work up with echo and a1c - concern for CHF: BNP elevated, trace LEE, mild vascular congestion - cardizem drip - eliquis  - tele   Septic bursitis, left elbow: - ortho consulted - already draining, f/u wound culture - ultrasound without evidence of DVT - continue ancef   Tobacco abuse:  - declined nicotine patch - counseled on cessation - will need low dose chest CT as an outpatient   FEN/GI: HH diet DVT ppx: eliquis Code: Full   Dispo: Admit patient to Inpatient with expected length of stay greater than 2 midnights.  Signed: [name], MD 03/24/2019, 2:00 PM  Pager: [phone]

## 2019-03-24 NOTE — Progress Notes (Addendum)
Upper extremity venous has been completed.   Preliminary results in CV Proc.   Blanch Media 03/24/2019 11:23 AM

## 2019-03-24 NOTE — Consult Note (Signed)
Reason for Consult:Left elbow septic bursitis Referring Physician: Ruel Barnett  Sean Barnett is an 68 y.o. male.  HPI: Sean Barnett noticed some swelling in his forearm earlier in the week. On Tuesday he woke up to find that his elbow was draining. He denies prior hx/o similar. He denies fevers, chills, sweats, N/V. He tells me that it doesn't hurt though I see he told the EDNP it did. He's on his elbows quite a bit for work. He was found to be in afib with RVR in the ED and so is getting admitted for that.  History reviewed. No pertinent past medical history.  No past surgical history on file.  History reviewed. No pertinent family history.  Social History:  reports that he has been smoking. He has been smoking about 1.00 pack per day. He has never used smokeless tobacco. He reports current alcohol use. No history on file for drug.  Allergies:  Allergies  Allergen Reactions  . Sulfa Antibiotics     Medications: I have reviewed the patient's current medications.  Results for orders placed or performed during the hospital encounter of 03/24/19 (from the past 48 hour(s))  CBC with Differential     Status: None   Collection Time: 03/24/19 10:12 AM  Result Value Ref Range   WBC 10.3 4.0 - 10.5 K/uL   RBC 4.46 4.22 - 5.81 MIL/uL   Hemoglobin 13.1 13.0 - 17.0 g/dL   HCT 16.1 09.6 - 04.5 %   MCV 92.4 80.0 - 100.0 fL   MCH 29.4 26.0 - 34.0 pg   MCHC 31.8 30.0 - 36.0 g/dL   RDW 40.9 81.1 - 91.4 %   Platelets 332 150 - 400 K/uL   nRBC 0.0 0.0 - 0.2 %   Neutrophils Relative % 70 %   Neutro Abs 7.2 1.7 - 7.7 K/uL   Lymphocytes Relative 20 %   Lymphs Abs 2.1 0.7 - 4.0 K/uL   Monocytes Relative 5 %   Monocytes Absolute 0.5 0.1 - 1.0 K/uL   Eosinophils Relative 4 %   Eosinophils Absolute 0.4 0.0 - 0.5 K/uL   Basophils Relative 1 %   Basophils Absolute 0.1 0.0 - 0.1 K/uL   Immature Granulocytes 0 %   Abs Immature Granulocytes 0.04 0.00 - 0.07 K/uL    Comment: Performed at [facility], [address], [place], [place] [address]    Dg Chest Portable 1 View  Result Date: 03/24/2019 CLINICAL DATA:  Shortness of breath and chest pain EXAM: PORTABLE CHEST 1 VIEW COMPARISON:  None. FINDINGS: Cardiac shadow is at the upper limits of normal in size. Lungs are well aerated bilaterally. Patchy increased density is noted in the medial aspect of the right upper lobe consistent with early infiltrate. Mild vascular congestion is noted without interstitial edema. IMPRESSION: Mild vascular congestion. Increased density in the right upper lobe medially consistent with early infiltrate. Electronically Signed   By: Alcide Clever M.D.   On: 03/24/2019 11:09   Ue Venous Duplex (mc And Wl Only)  Result Date: 03/24/2019 UPPER VENOUS STUDY  Indications: Swelling Performing Technologist: Blanch Media RVS  Examination Guidelines: A complete evaluation includes B-mode imaging, spectral Doppler, color Doppler, and power Doppler as needed of all accessible portions of each vessel. Bilateral testing is considered an integral part of a complete examination. Limited examinations for reoccurring indications may be performed as noted.  Right Findings: +----------+------------+---------+-----------+----------+-------+ RIGHT     CompressiblePhasicitySpontaneousPropertiesSummary +----------+------------+---------+-----------+----------+-------+ Subclavian    Full  Yes       Yes                      +----------+------------+---------+-----------+----------+-------+  Left Findings: +----------+------------+---------+-----------+----------+-------+ LEFT      CompressiblePhasicitySpontaneousPropertiesSummary +----------+------------+---------+-----------+----------+-------+ IJV           Full       Yes       Yes                      +----------+------------+---------+-----------+----------+-------+ Subclavian    Full       Yes       Yes                       +----------+------------+---------+-----------+----------+-------+ Axillary      Full       Yes       Yes                      +----------+------------+---------+-----------+----------+-------+ Brachial      Full       Yes       Yes                      +----------+------------+---------+-----------+----------+-------+ Radial        Full                                          +----------+------------+---------+-----------+----------+-------+ Ulnar         Full                                          +----------+------------+---------+-----------+----------+-------+ Cephalic      Full                                          +----------+------------+---------+-----------+----------+-------+ Basilic       Full                                          +----------+------------+---------+-----------+----------+-------+  Summary:  Right: No evidence of thrombosis in the subclavian.  Left: No evidence of deep vein thrombosis in the upper extremity. No evidence of superficial vein thrombosis in the upper extremity.  *See table(s) above for measurements and observations.    Preliminary     Review of Systems  Constitutional: Negative for chills, fever and weight loss.  HENT: Negative for ear discharge, ear pain, hearing loss and tinnitus.   Eyes: Negative for blurred vision, double vision, photophobia and pain.  Respiratory: Negative for cough, sputum production and shortness of breath.   Cardiovascular: Negative for chest pain.  Gastrointestinal: Negative for abdominal pain, nausea and vomiting.  Genitourinary: Negative for dysuria, flank pain, frequency and urgency.  Musculoskeletal: Negative for back pain, falls, joint pain, myalgias and neck pain.  Neurological: Negative for dizziness, tingling, sensory change, focal weakness, loss of consciousness and headaches.  Endo/Heme/Allergies: Does not bruise/bleed easily.  Psychiatric/Behavioral: Negative for depression,  memory loss and substance abuse. The patient is not nervous/anxious.    Blood pressure (!) 162/126, pulse Marland Kitchen)  137, temperature 97.9 F (36.6 C), temperature source Oral, resp. rate 14, height 6\' 4"  (1.93 m), weight 113.4 kg, SpO2 100 %. Physical Exam  Constitutional: He appears well-developed and well-nourished. No distress.  HENT:  Head: Normocephalic and atraumatic.  Eyes: Conjunctivae are normal. Right eye exhibits no discharge. Left eye exhibits no discharge. No scleral icterus.  Neck: Normal range of motion.  Cardiovascular: Normal rate and regular rhythm.  Respiratory: Effort normal. No respiratory distress.  Musculoskeletal:     Comments: Left shoulder, elbow, wrist, digits- Punctate wound elbow with purulent discharge, mild surrounding erythema, nontender, no instability, no blocks to motion, normal AROM  Sens  Ax/R/M/U intact  Mot   Ax/ R/ PIN/ M/ AIN/ U intact  Rad 2+  Neurological: He is alert.  Skin: Skin is warm and dry. He is not diaphoretic.  Psychiatric: He has a normal mood and affect. His behavior is normal.    Assessment/Plan: Left elbow septic bursitis -- Will obtain culture and treat medically as this is draining already. Will start on Ancef. No activity restrictions.    Freeman Caldron, PA-C Orthopedic Surgery [phone] 03/24/2019, 11:31 AM

## 2019-03-24 NOTE — ED Notes (Signed)
Nurse Navigator: The patient declines the request for the writer to contact family.

## 2019-03-24 NOTE — Discharge Instructions (Signed)
Please go to the ER for further evaluation of the infection in your arm, increased heart rate and elevated blood pressure

## 2019-03-24 NOTE — ED Notes (Signed)
Patient able to ambulate independently  

## 2019-03-24 NOTE — ED Notes (Signed)
ED TO INPATIENT HANDOFF REPORT  ED Nurse Name and Phone #: Parilee Hally, [phone]  S Name/Age/Gender Sean Barnett 68 y.o. male Room/Bed: 031C/031C  Code Status   Code Status: Full Code  Home/SNF/Other Home Patient oriented to: self, place, time and situation Is this baseline? Yes   Triage Complete: Triage complete  Chief Complaint SOB; Elbow Infection  Triage Note Pt arrvies from home complaining of SOB  And elbow infection. Pt states a week ago he felt like he had bruised his elbow but it had blistered up and the skin broke. Pt sent from urgent care.   Allergies Allergies  Allergen Reactions  . Sulfa Antibiotics     Level of Care/Admitting Diagnosis ED Disposition    ED Disposition Condition Comment   Admit  [facility] [[id number]]  Level of Care: Telemetry Cardiac [103]  Covid Evaluation: N/A  Diagnosis: Atrial fibrillation with RVR [facility](HCC) [[id number]]) [[id number]]  Admitting Physician: Erenest BlankBUTCHER, ELIZABETH A [[id number]]  Attending Physician: Burns SpainBUTCHER, ELIZABETH A [phone][[id number]]  Estimated length of stay: past midnight tomorrow  Certification:: I certify this patient will need inpatient services for at least 2 midnights  PT Class (Do Not Modify): Inpatient [101]  PT Acc Code (Do Not Modify): Private [1]       B Medical/Surgery History History reviewed. No pertinent past medical history. No past surgical history on file.   A IV Location/Drains/Wounds Patient Lines/Drains/Airways Status   Active Line/Drains/Airways    Name:   Placement date:   Placement time:   Site:   Days:   Peripheral IV 03/24/19 Left Antecubital   03/24/19    0950    Antecubital   less than 1          Intake/Output Last 24 hours  Intake/Output Summary (Last 24 hours) at 03/24/2019 1306 Last data filed at 03/24/2019 1242 Gross per 24 hour  Intake 100.51 ml  Output -  Net 100.51 ml    Labs/Imaging Results for orders placed or performed during the hospital encounter of 03/24/19 (from the  past 48 hour(s))  Comprehensive metabolic panel     Status: Abnormal   Collection Time: 03/24/19 10:12 AM  Result Value Ref Range   Sodium 139 135 - 145 mmol/L   Potassium 4.4 3.5 - 5.1 mmol/L   Chloride 104 98 - 111 mmol/L   CO2 21 (L) 22 - 32 mmol/L   Glucose, Bld 117 (H) 70 - 99 mg/dL   BUN 14 8 - 23 mg/dL   Creatinine, Ser 8.291.14 0.61 - 1.24 mg/dL   Calcium 9.2 8.9 - 56.210.3 mg/dL   Total Protein 8.1 6.5 - 8.1 g/dL   Albumin 3.6 3.5 - 5.0 g/dL   AST 28 15 - 41 U/L   ALT 30 0 - 44 U/L   Alkaline Phosphatase 134 (H) 38 - 126 U/L   Total Bilirubin 1.1 0.3 - 1.2 mg/dL   GFR calc non Af Amer >60 >60 mL/min   GFR calc Af Amer >60 >60 mL/min   Anion gap 14 5 - 15    Comment: Performed at [facility], [address] [place], [place] [address]  Troponin I - Once     Status: None   Collection Time: 03/24/19 10:12 AM  Result Value Ref Range   Troponin I <0.03 <0.03 ng/mL    Comment: Performed at [facility], [address] [place], [place] [address]  Brain natriuretic peptide     Status: Abnormal   Collection Time:  03/24/19 10:12 AM  Result Value Ref Range   B Natriuretic Peptide 756.6 (H) 0.0 - 100.0 pg/mL    Comment: Performed at [facility], [address] [place], [place] [address]  CBC with Differential     Status: None   Collection Time: 03/24/19 10:12 AM  Result Value Ref Range   WBC 10.3 4.0 - 10.5 K/uL   RBC 4.46 4.22 - 5.81 MIL/uL   Hemoglobin 13.1 13.0 - 17.0 g/dL   HCT 58.3 09.4 - 07.6 %   MCV 92.4 80.0 - 100.0 fL   MCH 29.4 26.0 - 34.0 pg   MCHC 31.8 30.0 - 36.0 g/dL   RDW 80.8 81.1 - 03.1 %   Platelets 332 150 - 400 K/uL   nRBC 0.0 0.0 - 0.2 %   Neutrophils Relative % 70 %   Neutro Abs 7.2 1.7 - 7.7 K/uL   Lymphocytes Relative 20 %   Lymphs Abs 2.1 0.7 - 4.0 K/uL   Monocytes Relative 5 %   Monocytes Absolute 0.5 0.1 - 1.0 K/uL   Eosinophils Relative 4 %   Eosinophils Absolute 0.4 0.0 - 0.5 K/uL   Basophils Relative 1 %   Basophils  Absolute 0.1 0.0 - 0.1 K/uL   Immature Granulocytes 0 %   Abs Immature Granulocytes 0.04 0.00 - 0.07 K/uL    Comment: Performed at [facility], [address] [place], [place] [address]  TSH     Status: None   Collection Time: 03/24/19 10:12 AM  Result Value Ref Range   TSH 2.450 0.350 - 4.500 uIU/mL    Comment: Performed by a 3rd Generation assay with a functional sensitivity of <=0.01 uIU/mL. Performed at [facility], [address] [place], [place] [address]    Dg Chest Portable 1 View  Result Date: 03/24/2019 CLINICAL DATA:  Shortness of breath and chest pain EXAM: PORTABLE CHEST 1 VIEW COMPARISON:  None. FINDINGS: Cardiac shadow is at the upper limits of normal in size. Lungs are well aerated bilaterally. Patchy increased density is noted in the medial aspect of the right upper lobe consistent with early infiltrate. Mild vascular congestion is noted without interstitial edema. IMPRESSION: Mild vascular congestion. Increased density in the right upper lobe medially consistent with early infiltrate. Electronically Signed   By: Alcide Clever M.D.   On: 03/24/2019 11:09   Ue Venous Duplex (mc And Wl Only)  Result Date: 03/24/2019 UPPER VENOUS STUDY  Indications: Swelling Performing Technologist: Blanch Media RVS  Examination Guidelines: A complete evaluation includes B-mode imaging, spectral Doppler, color Doppler, and power Doppler as needed of all accessible portions of each vessel. Bilateral testing is considered an integral part of a complete examination. Limited examinations for reoccurring indications may be performed as noted.  Right Findings: +----------+------------+---------+-----------+----------+-------+ RIGHT     CompressiblePhasicitySpontaneousPropertiesSummary +----------+------------+---------+-----------+----------+-------+ Subclavian    Full       Yes       Yes                      +----------+------------+---------+-----------+----------+-------+   Left Findings: +----------+------------+---------+-----------+----------+-------+ LEFT      CompressiblePhasicitySpontaneousPropertiesSummary +----------+------------+---------+-----------+----------+-------+ IJV           Full       Yes       Yes                      +----------+------------+---------+-----------+----------+-------+ Subclavian    Full  Yes       Yes                      +----------+------------+---------+-----------+----------+-------+ Axillary      Full       Yes       Yes                      +----------+------------+---------+-----------+----------+-------+ Brachial      Full       Yes       Yes                      +----------+------------+---------+-----------+----------+-------+ Radial        Full                                          +----------+------------+---------+-----------+----------+-------+ Ulnar         Full                                          +----------+------------+---------+-----------+----------+-------+ Cephalic      Full                                          +----------+------------+---------+-----------+----------+-------+ Basilic       Full                                          +----------+------------+---------+-----------+----------+-------+  Summary:  Right: No evidence of thrombosis in the subclavian.  Left: No evidence of deep vein thrombosis in the upper extremity. No evidence of superficial vein thrombosis in the upper extremity.  *See table(s) above for measurements and observations.    Preliminary     Pending Labs Unresulted Labs (From admission, onward)    Start     Ordered   03/25/19 0500  Basic metabolic panel  Tomorrow morning,   R     03/24/19 1300   03/25/19 0500  Heparin level (unfractionated)  Daily,   R     03/24/19 1304   03/25/19 0500  CBC  Daily,   R     03/24/19 1304   03/24/19 2000  Heparin level (unfractionated)  Once-Timed,   R     03/24/19 1304    03/24/19 1301  HIV antibody (Routine Testing)  Once,   R     03/24/19 1300   03/24/19 1140  Aerobic Culture (superficial specimen)  Once,   R     03/24/19 1139          Vitals/Pain Today's Vitals   03/24/19 1215 03/24/19 1230 03/24/19 1245 03/24/19 1300  BP: (!) 163/124 (!) 162/133 (!) 149/127 (!) 154/122  Pulse:  (!) 117 (!) 120 (!) 59  Resp: 17 (!) 23 20 (!) 25  Temp:      TempSrc:      SpO2:  99% 97% 97%  Weight:      Height:      PainSc:        Isolation Precautions No active isolations  Medications Medications  ceFAZolin (ANCEF) IVPB 2g/100  mL premix (0 g Intravenous Stopped 03/24/19 1242)  diltiazem (CARDIZEM) 1 mg/mL load via infusion 10 mg (10 mg Intravenous Bolus from Bag 03/24/19 1248)    And  diltiazem (CARDIZEM) 100 mg in dextrose 5% (1 mg/mL) infusion (5 mg/hr Intravenous New Bag/Given 03/24/19 1248)  sodium chloride flush (NS) 0.9 % injection 3 mL (has no administration in time range)  acetaminophen (TYLENOL) tablet 650 mg (has no administration in time range)    Or  acetaminophen (TYLENOL) suppository 650 mg (has no administration in time range)  polyethylene glycol (MIRALAX / GLYCOLAX) packet 17 g (has no administration in time range)  heparin bolus via infusion 5,500 Units (has no administration in time range)  heparin ADULT infusion 100 units/mL (25000 units/220mL sodium chloride 0.45%) (has no administration in time range)    Mobility walks Low fall risk   Focused Assessments Cardiac Assessment Handoff:    Lab Results  Component Value Date   TROPONINI <0.03 03/24/2019   No results found for: DDIMER Does the Patient currently have chest pain? No     R Recommendations: See Admitting Provider Note  Report given to:   Additional Notes:

## 2019-03-25 ENCOUNTER — Inpatient Hospital Stay (HOSPITAL_COMMUNITY): Payer: Medicaid Other

## 2019-03-25 DIAGNOSIS — Z720 Tobacco use: Secondary | ICD-10-CM

## 2019-03-25 DIAGNOSIS — I340 Nonrheumatic mitral (valve) insufficiency: Secondary | ICD-10-CM

## 2019-03-25 LAB — CBC
HCT: 36.5 % — ABNORMAL LOW (ref 39.0–52.0)
Hemoglobin: 12.2 g/dL — ABNORMAL LOW (ref 13.0–17.0)
MCH: 29.8 pg (ref 26.0–34.0)
MCHC: 33.4 g/dL (ref 30.0–36.0)
MCV: 89 fL (ref 80.0–100.0)
Platelets: 303 10*3/uL (ref 150–400)
RBC: 4.1 MIL/uL — ABNORMAL LOW (ref 4.22–5.81)
RDW: 14.8 % (ref 11.5–15.5)
WBC: 12 10*3/uL — ABNORMAL HIGH (ref 4.0–10.5)
nRBC: 0 % (ref 0.0–0.2)

## 2019-03-25 LAB — BASIC METABOLIC PANEL
Anion gap: 11 (ref 5–15)
BUN: 18 mg/dL (ref 8–23)
CO2: 22 mmol/L (ref 22–32)
Calcium: 8.9 mg/dL (ref 8.9–10.3)
Chloride: 105 mmol/L (ref 98–111)
Creatinine, Ser: 1.16 mg/dL (ref 0.61–1.24)
GFR calc Af Amer: >60 mL/min (ref >60)
GFR calc non Af Amer: >60 mL/min (ref >60)
Glucose, Bld: 115 mg/dL — ABNORMAL HIGH (ref 70–99)
Potassium: 4 mmol/L (ref 3.5–5.1)
Sodium: 138 mmol/L (ref 135–145)

## 2019-03-25 LAB — ECHOCARDIOGRAM LIMITED
Height: 76 in
Weight: 4000 oz

## 2019-03-25 LAB — HEMOGLOBIN A1C
Hgb A1c MFr Bld: 5.9 % — ABNORMAL HIGH (ref 4.8–5.6)
Mean Plasma Glucose: 122.63 mg/dL

## 2019-03-25 LAB — HIV ANTIBODY (ROUTINE TESTING W REFLEX): HIV Screen 4th Generation wRfx: NONREACTIVE

## 2019-03-25 MED ORDER — NICOTINE POLACRILEX 2 MG MT GUM
2.0000 mg | CHEWING_GUM | OROMUCOSAL | Status: DC | PRN
  Filled 2019-03-25 (×2): qty 1

## 2019-03-25 MED ORDER — RAMELTEON 8 MG PO TABS
8.0000 mg | ORAL_TABLET | Freq: Every day | ORAL | Status: DC
  Administered 2019-03-25: 22:00:00 8 mg via ORAL
  Filled 2019-03-25 (×2): qty 1

## 2019-03-25 MED ORDER — DILTIAZEM HCL 60 MG PO TABS
120.0000 mg | ORAL_TABLET | Freq: Four times a day (QID) | ORAL | Status: AC
  Administered 2019-03-25 (×2): 120 mg via ORAL
  Filled 2019-03-25 (×2): qty 2

## 2019-03-25 MED ORDER — CARVEDILOL 6.25 MG PO TABS
6.2500 mg | ORAL_TABLET | Freq: Two times a day (BID) | ORAL | Status: DC
  Administered 2019-03-25 – 2019-03-26 (×2): 6.25 mg via ORAL
  Filled 2019-03-25 (×2): qty 1

## 2019-03-25 MED ORDER — LISINOPRIL 2.5 MG PO TABS
2.5000 mg | ORAL_TABLET | Freq: Every day | ORAL | Status: DC
  Administered 2019-03-26: 2.5 mg via ORAL
  Filled 2019-03-25: qty 1

## 2019-03-25 MED ORDER — FUROSEMIDE 10 MG/ML IJ SOLN
20.0000 mg | Freq: Once | INTRAMUSCULAR | Status: AC
  Administered 2019-03-25: 20 mg via INTRAVENOUS
  Filled 2019-03-25: qty 2

## 2019-03-25 NOTE — Progress Notes (Signed)
  Echocardiogram 2D Echocardiogram has been performed.  Celene Skeen 03/25/2019, 9:38 AM

## 2019-03-25 NOTE — Progress Notes (Signed)
   Subjective:    Mr. Soldano reports that he is feeling well this morning and reports resolution of  Palpitation. He said prior to admission it felts as if "he was wearing a band on his chest" and also experienced shortness of breath but those symptoms have resolved. He hasn't tried to lay flat to sleep yet. He was able to sleep. He states that he has not signed up for medicare yet. He is also willing to follow up with the [facility] clinic for follow up and establish care    Denies chest pain, palpitations, sob   Objective:  Vital signs in last 24 hours: Vitals:   03/24/19 1630 03/24/19 2000 03/24/19 2319 03/25/19 0356  BP: (!) 143/110 (!) 156/98 (!) 156/113 140/77  Pulse: 92 (!) 109 88 82  Resp: 19 15 20 16   Temp:  98.1 F (36.7 C) (!) 97.4 F (36.3 C) (!) 97.5 F (36.4 C)  TempSrc:  Axillary Oral Oral  SpO2: 96% 94% 92% 90%  Weight:      Height:       Gen: sitting on edge of bed, NAD Card: irregularly irregular, HR 89-100, no JVD Pulm: CTAB, normal wob on RA Ext: no LEE MSK: left elbow slightly improved, not draining much, remains TTP/red/warm/swollen  Assessment/Plan:  Active Problems:   Atrial fibrillation with RVR (HCC)  68yoM with no known PMHx presenting with an infected left elbow joint and palpitations found to be in a. Fib with RVR.   A. Fib with RVR:  - rate controlled overnight on 15mg /hr of IV diltiazem, rhythm is still a.fib  - convert to po diltiazem 120mg  QID, can stop IV dilt 1 hr after first po dose is given. Will transition to po dilt q12h on discharge - a1c 5.9 - f/u echo  - continue eliquis, consult case management for Medicare assistance and 30 day eliquis card  Septic bursitis, left elbow: - appreciate ortho's assistance - afebrile, slight leukocytosis, wound culture NGTD - continue ancef  - f/u ortho recs  Tobacco abuse:  - will need low dose chest CT as an outpatient    Dispo: Anticipated discharge in approximately 1 day(s).   [name], MD 03/25/2019, 7:00 AM Pager: [phone]

## 2019-03-25 NOTE — Progress Notes (Signed)
Date: 03/25/2019  Patient name: Sean Barnett  Medical record number: [id number]  Date of birth: Oct 19, 1951   I have seen and evaluated Sean Barnett and discussed their care with the Residency Team. Sean Barnett is a 68 year old man with no known medical illness but no PCP.  He presented from urgent care for a left septic olecranon bursitis.  He works as a Music therapist and if he is going to be on hard surfaces, he does wear elbow pads.  But he frequently leans on his elbows although this is mostly in bed.  In the ED, he was found to be in A. fib with RVR.  Upon questioning, he has noted difficulty lying to sleep on his stomach.  He also describes feeling like a tight band around his chest, palpitations, and inability to lie flat.  This morning, he reports resolution of all of his cardiac complaints.  His elbow continues to have slight drainage.  He declines a tetanus vaccine because he states he is very careful at work.  PMHx, Fam Hx, and/or Soc Hx : He never signed up for Medicare so he does not have any payer source.  Vitals:   03/25/19 0907 03/25/19 0933  BP: (!) 158/112 (!) 141/102  Pulse:    Resp:    Temp: 97.6 F (36.4 C)   SpO2:    Heart rate 82, respiratory rate 16, O2 sat in the 90s on room air General no acute distress sitting in bed, able to speak in full sentences Left elbow olecranon swelling, no active drainage, tiny opening visible Lungs clear to auscultation bilaterally with good airflow  BNP 757 Troponin less than 0.03 Hemoglobin 13.1 A1c 5.9 TSH 2.5 Cultures pending  I personally viewed the CXR images and confirmed my reading with the official read.  AP one view, good quality, with changes consistent with vascular congestion  I personally viewed the EKG and confirmed my reading with the official read.  A. fib with RVR, no vascular changes  Assessment and Plan: I have seen and evaluated the patient as outlined above. I agree with the formulated Assessment and Plan  as detailed in the residents' note, with the following changes: Sean Barnett is a 68 year old man with no known medical illness but no PCP.   His chief complaint were symptoms due to a left septic olecranon bursa that was found to be in new onset A. fib with RVR.  His RF for A. fib include age, sex, and possibly hypertension although this is not clear as he has not had outpatient ongoing care.  He denies alcohol use or family history of cardiac disease.  His TSH was normal and he has no EKG or troponin changes consistent with ACS.  His echo is currently pending.  He was able to the rate controlled on a Cardizem drip and that is being converted to oral Cardizem.  He is also on a DOAC for anticoagulation.  Orthopedics has evaluated the elbow and recommended medical treatment with Ancef without surgical intervention since it was spontaneously draining.  1.  New onset A. fib with RVR - convert IV Cardizem drip to oral Cardizem with goal to get him on daily dosing.  Continue DOAC.  Follow-up echo results.  2.  Left septic olecranon bursitis - cont Ancef and follow-up Ortho recommendations  3.  Elevated blood pressure - due to the severity of elevation, elevation of blood pressure to 152/99 during a 2017 ER visit, and elevation to 142/87 during a 2016 ER visit he  likely has hypertension.  He will need to be on a nodal blocking agent for his A. fib for rate control.  This will be Cardizem and unless he is found to have a low ejection fraction at which point we will need to be changed to a beta-blocker which might not be adequate for his blood pressure control but can be monitored as an outpatient.   4.  Lack of payer source - he would qualify for Medicare based on age.  Unfortunately, I believe he will incur a penalty for Medicare part D that we will get a Energy manager to help with this process.  We will also need assistance with medications at discharge.  He will be followed up at [facility] once he is  stable for discharge.  Burns Spain, MD 4/25/202010:05 AM

## 2019-03-25 NOTE — Discharge Instructions (Addendum)
Information on my medicine - ELIQUIS (apixaban)  Why was Eliquis prescribed for you? Eliquis was prescribed for you to reduce the risk of a blood clot forming that can cause a stroke if you have a medical condition called atrial fibrillation (a type of irregular heartbeat).  What do You need to know about Eliquis ? Take your Eliquis TWICE DAILY - one tablet in the morning and one tablet in the evening with or without food. If you have difficulty swallowing the tablet whole please discuss with your pharmacist how to take the medication safely.  Take Eliquis exactly as prescribed by your doctor and DO NOT stop taking Eliquis without talking to the doctor who prescribed the medication.  Stopping may increase your risk of developing a stroke.  Refill your prescription before you run out.  After discharge, you should have regular check-up appointments with your healthcare provider that is prescribing your Eliquis.  In the future your dose may need to be changed if your kidney function or weight changes by a significant amount or as you get older.  What do you do if you miss a dose? If you miss a dose, take it as soon as you remember on the same day and resume taking twice daily.  Do not take more than one dose of ELIQUIS at the same time to make up a missed dose.  Important Safety Information A possible side effect of Eliquis is bleeding. You should call your healthcare provider right away if you experience any of the following: ? Bleeding from an injury or your nose that does not stop. ? Unusual colored urine (red or dark brown) or unusual colored stools (red or black). ? Unusual bruising for unknown reasons. ? A serious fall or if you hit your head (even if there is no bleeding).  Some medicines may interact with Eliquis and might increase your risk of bleeding or clotting while on Eliquis. To help avoid this, consult your healthcare provider or pharmacist prior to using any new  prescription or non-prescription medications, including herbals, vitamins, non-steroidal anti-inflammatory drugs (NSAIDs) and supplements.  This website has more information on Eliquis (apixaban): http://www.eliquis.com/eliquis/home   Atrial Fibrillation  Atrial fibrillation is a type of heartbeat that is irregular or fast (rapid). If you have this condition, your heart beats without any order. This makes it hard for your heart to pump blood in a normal way. Having this condition gives you more risk for stroke, heart failure, and other heart problems. Atrial fibrillation may start all of a sudden and then stop on its own, or it may become a long-lasting problem. What are the causes? This condition may be caused by heart conditions, such as:  High blood pressure.  Heart failure.  Heart valve disease.  Heart surgery. Other causes include:  Pneumonia.  Obstructive sleep apnea.  Lung cancer.  Thyroid disease.  Drinking too much alcohol. Sometimes the cause is not known. What increases the risk? You are more likely to develop this condition if:  You smoke.  You are older.  You have diabetes.  You are overweight.  You have a family history of this condition.  You exercise often and hard. What are the signs or symptoms? Common symptoms of this condition include:  A feeling like your heart is beating very fast.  Chest pain.  Feeling short of breath.  Feeling light-headed or weak.  Getting tired easily. Follow these instructions at home: Medicines  Take over-the-counter and prescription medicines only as told by  your doctor.  If your doctor gives you a blood-thinning medicine, take it exactly as told. Taking too much of it can cause bleeding. Taking too little of it does not protect you against clots. Clots can cause a stroke. Lifestyle      Do not use any tobacco products. These include cigarettes, chewing tobacco, and e-cigarettes. If you need help  quitting, ask your doctor.  Do not drink alcohol.  Do not drink beverages that have caffeine. These include coffee, soda, and tea.  Follow diet instructions as told by your doctor.  Exercise regularly as told by your doctor. General instructions  If you have a condition that causes breathing to stop for a short period of time (apnea), treat it as told by your doctor.  Keep a healthy weight. Do not use diet pills unless your doctor says they are safe for you. Diet pills may make heart problems worse.  Keep all follow-up visits as told by your doctor. This is important. Contact a doctor if:  You notice a change in the speed, rhythm, or strength of your heartbeat.  You are taking a blood-thinning medicine and you see more bruising.  You get tired more easily when you move or exercise.  You have a sudden change in weight. Get help right away if:   You have pain in your chest or your belly (abdomen).  You have trouble breathing.  You have blood in your vomit, poop, or pee (urine).  You have any signs of a stroke. "BE FAST" is an easy way to remember the main warning signs: ? B - Balance. Signs are dizziness, sudden trouble walking, or loss of balance. ? E - Eyes. Signs are trouble seeing or a change in how you see. ? F - Face. Signs are sudden weakness or loss of feeling in the face, or the face or eyelid drooping on one side. ? A - Arms. Signs are weakness or loss of feeling in an arm. This happens suddenly and usually on one side of the body. ? S - Speech. Signs are sudden trouble speaking, slurred speech, or trouble understanding what people say. ? T - Time. Time to call emergency services. Write down what time symptoms started.  You have other signs of a stroke, such as: ? A sudden, very bad headache with no known cause. ? Feeling sick to your stomach (nausea). ? Throwing up (vomiting). ? Jerky movements you cannot control (seizure). These symptoms may be an emergency.  Do not wait to see if the symptoms will go away. Get medical help right away. Call your local emergency services (911 in the [place] Do not drive yourself to the hospital. Summary  Atrial fibrillation is a type of heartbeat that is irregular or fast (rapid).  You are at higher risk of this condition if you smoke, are older, have diabetes, or are overweight.  Follow your doctor's instructions about medicines, diet, exercise, and follow-up visits.  Get help right away if you think that you have signs of a stroke. This information is not intended to replace advice given to you by your health care provider. Make sure you discuss any questions you have with your health care provider. Document Released: 08/25/2008 Document Revised: 01/07/2018 Document Reviewed: 01/07/2018 Elsevier Interactive Patient Education  2019 Elsevier Inc.]   Heart Failure  Heart failure means your heart has trouble pumping blood. This makes it hard for your body to work well. Heart failure is usually a long-term (chronic) condition. You must  take good care of yourself and follow your treatment plan from your doctor. Follow these instructions at home: Medicines  Take over-the-counter and prescription medicines only as told by your doctor. ? Do not stop taking your medicine unless your doctor told you to do that. ? Do not skip any doses. ? Refill your prescriptions before you run out of medicine. You need your medicines every day. Eating and drinking   Eat heart-healthy foods. Talk with a diet and nutrition specialist (dietitian) to make an eating plan.  Choose foods that: ? Have no trans fat. ? Are low in saturated fat and cholesterol.  Choose healthy foods, like: ? Fresh or frozen fruits and vegetables. ? Fish. ? Low-fat (lean) meats. ? Legumes (like beans, peas, and lentils). ? Fat-free or low-fat dairy products. ? Whole-grain foods. ? High-fiber foods.  Limit salt (sodium) if told by your doctor. Ask  your nutrition specialist to recommend heart-healthy seasonings.  Cook in healthy ways instead of frying. Healthy ways of cooking include: ? Roasting. ? Grilling. ? Broiling. ? Baking. ? Poaching. ? Steaming. ? Stir-frying.  Limit how much fluid you drink, if told by your doctor. Lifestyle  Do not smoke or use chewing tobacco. Do not use nicotine gum or patches before talking to your doctor.  Limit alcohol intake to no more than 1 drink a day for non-pregnant women and 2 drinks a day for men. One drink equals 12 oz of beer, 5 oz of wine, or 1 oz of hard liquor. ? Tell your doctor if you drink alcohol many times a week. ? Talk with your doctor about whether any alcohol is safe for you. ? You should stop drinking alcohol: ? If your heart has been damaged by alcohol. ? You have very bad heart failure.  Do not use illegal drugs.  Lose weight if told by your doctor.  Do moderate physical activity if told by your doctor. Ask your doctor what activities are safe for you if: ? You are of older age (elderly). ? You have very bad heart failure. Keep track of important information  Weigh yourself every day. ? Weigh yourself every morning after you pee (urinate) and before breakfast. ? Wear the same amount of clothing each time. ? Write down your daily weight. Give your record to your doctor.  Check and write down your blood pressure as told by your doctor.  Check your pulse as told by your doctor. Dealing with heat and cold  If the weather is very hot: ? Avoid activity that takes a lot of energy. ? Use air conditioning or fans, or find a cooler place. ? Avoid caffeine. ? Avoid alcohol. ? Wear clothing that is loose-fitting, lightweight, and light-colored.  If the weather is very cold: ? Avoid activity that takes a lot of energy. ? Layer your clothes. ? Wear mittens or gloves, a hat, and a scarf when you go outside. ? Avoid alcohol. General instructions  Manage other  conditions that you have as told by your doctor.  Learn to manage stress. If you need help, ask your doctor.  Plan rest periods for when you get tired.  Get education and support as needed.  Get rehab (rehabilitation) to help you stay independent and to help with everyday tasks.  Stay up to date with shots (immunizations), especially pneumococcal and flu (influenza) shots.  Keep all follow-up visits as told by your doctor. This is important. Contact a doctor if:  You gain weight quickly.  You  are more short of breath than normal.  You cannot do your normal activities.  You tire easily.  You cough more than normal, especially with activity.  You have any or more puffiness (swelling) in areas such as your hands, feet, ankles, or belly (abdomen).  You cannot sleep because it is hard to breathe.  You feel like your heart is beating fast (palpitations).  You get dizzy or light-headed when you stand up. Get help right away if:  You have trouble breathing.  You or someone else notices a change in your awareness. This could be trouble staying awake or trouble concentrating.  You have chest pain or discomfort.  You pass out (faint). Summary  Heart failure means your heart has trouble pumping blood.  Make sure you refill your prescriptions before you run out of medicine. You need your medicines every day.  Keep records of your weight and blood pressure to give to your doctor.  Contact a doctor if you gain weight quickly. This information is not intended to replace advice given to you by your health care provider. Make sure you discuss any questions you have with your health care provider. Document Released: 08/25/2008 Document Revised: 08/10/2018 Document Reviewed: 12/08/2016 Elsevier Interactive Patient Education  2019 [employer].

## 2019-03-25 NOTE — TOC Initial Note (Signed)
Transition of Care [facility]) - Initial/Assessment Note    Patient Details  Name: Sean Barnett MRN: [id number] Date of Birth: [birth year]  Transition of Care [facility]) CM/SW Contact:    Deveron Furlong, RN Phone Number: 03/25/2019, 3:38 PM  Clinical Narrative:      Pt sought care for bursitis and found to be in afib.  Pt works as an Chief Executive Officer and states he has not filed for [employer] "because I wasn't ready to stop working".  Medicare vs. social security briefly explained to patient.   Pt given information about how to apply for Medicare.  Explained to patient that he would also need a Medicare Part D plan to cover prescriptions. Pt advised that he could go to social security office or apply online.  Patient will likely have to wait until open enrollment in October to apply, with insurance starting in January.  Pt states "I haven't decided what I'm going to do yet."  [facility] and will need an orange card for the rest of this year.  Pt advised to f/u with them or cardiologist about patient assistance program for Eliquis during the first 30 days.  Pt could f/u with [facility] if needed and receive prescription support.  Pt states he lives alone and drives.  His brother stays with him a few days/month as he is a Naval architect.  His brother will be home tonight and tomorrow.   Expected Discharge Plan: Home/Self Care Barriers to Discharge: Inadequate or no insurance, Barriers Resolved   Patient Goals and CMS Choice Patient states their goals for this hospitalization and ongoing recovery are:: to get back to work      Expected Discharge Plan and Services Expected Discharge Plan: Home/Self Care   Discharge Planning Services: CM Consult, Medication Assistance, Indigent [facility]   Prior Living Arrangements/Services    Pt was independent with all ADLs with limited support.  Pt received limited medical care.   Activities of Daily Living Home  Assistive Devices/Equipment: None ADL Screening (condition at time of admission) Patient's cognitive ability adequate to safely complete daily activities?: Yes Is the patient deaf or have difficulty hearing?: No Does the patient have difficulty seeing, even when wearing glasses/contacts?: No Does the patient have difficulty concentrating, remembering, or making decisions?: No Patient able to express need for assistance with ADLs?: Yes Does the patient have difficulty dressing or bathing?: No Independently performs ADLs?: Yes (appropriate for developmental age) Does the patient have difficulty walking or climbing stairs?: No Weakness of Legs: None Weakness of Arms/Hands: None    Admission diagnosis:  Atrial fibrillation with rapid ventricular response (HCC) [I48.91] Septic bursitis of elbow, left [M71.122] Atrial fibrillation with RVR (HCC) [I48.91] Patient Active Problem List   Diagnosis Date Noted  . Atrial fibrillation with RVR (HCC) 03/24/2019   PCP:  Patient, No Pcp Per Pharmacy:   [id number] - [place] [place] - [address] AT [facility] [address]  [place] [address] Phone: [phone] Fax: [phone]     Social Determinants of Health (SDOH) Interventions    Readmission Risk Interventions Readmission Risk Prevention Plan 03/25/2019  Post Dischage Appt Complete  Medication Screening Complete  Transportation Screening Complete  Some recent data might be hidden

## 2019-03-26 DIAGNOSIS — I5020 Unspecified systolic (congestive) heart failure: Secondary | ICD-10-CM

## 2019-03-26 DIAGNOSIS — I110 Hypertensive heart disease with heart failure: Secondary | ICD-10-CM

## 2019-03-26 DIAGNOSIS — Z79899 Other long term (current) drug therapy: Secondary | ICD-10-CM

## 2019-03-26 DIAGNOSIS — Z7901 Long term (current) use of anticoagulants: Secondary | ICD-10-CM

## 2019-03-26 DIAGNOSIS — B9561 Methicillin susceptible Staphylococcus aureus infection as the cause of diseases classified elsewhere: Secondary | ICD-10-CM

## 2019-03-26 LAB — AEROBIC CULTURE? (SUPERFICIAL SPECIMEN)

## 2019-03-26 LAB — AEROBIC CULTURE W GRAM STAIN (SUPERFICIAL SPECIMEN): Gram Stain: NONE SEEN

## 2019-03-26 MED ORDER — LISINOPRIL 2.5 MG PO TABS: 2.5000 mg | ORAL_TABLET | Freq: Every day | ORAL | 0 refills | Status: DC

## 2019-03-26 MED ORDER — CEPHALEXIN 500 MG PO CAPS: 500.0000 mg | ORAL_CAPSULE | Freq: Four times a day (QID) | ORAL | 0 refills | Status: DC

## 2019-03-26 MED ORDER — CARVEDILOL 12.5 MG PO TABS: 12.5000 mg | ORAL_TABLET | Freq: Two times a day (BID) | ORAL | 0 refills | Status: DC

## 2019-03-26 MED ORDER — CARVEDILOL 12.5 MG PO TABS: 12.5000 mg | ORAL_TABLET | Freq: Two times a day (BID) | ORAL | Status: DC

## 2019-03-26 MED ORDER — CARVEDILOL 6.25 MG PO TABS
6.2500 mg | ORAL_TABLET | Freq: Once | ORAL | Status: AC
  Administered 2019-03-26: 08:00:00 6.25 mg via ORAL
  Filled 2019-03-26: qty 1

## 2019-03-26 MED ORDER — APIXABAN 5 MG PO TABS: 5.0000 mg | ORAL_TABLET | Freq: Two times a day (BID) | ORAL | 0 refills | Status: DC

## 2019-03-26 NOTE — Progress Notes (Signed)
Pt discharge home. Discharge instructions given to pt, answered all questions. Wheeled pt out to his truck, which he parked near urgent care.

## 2019-03-26 NOTE — Progress Notes (Signed)
   Subjective: HD#2   Overnight: His heart rate was better controled with coreg.  Today, Mr. Sean Barnett reports that he is feeling better this morning. He slept much better, may be in part to taking melatonin. He denies any further episodes of chest pressure, chest pain, congestion. He still endorses cough. We explained the discharge plan with him and he expressed understanding. He understands he will be discharged with Coreg, Eliquis, Lisinopril, Keflex   Objective:  Vital signs in last 24 hours: Vitals:   03/26/19 0259 03/26/19 0300 03/26/19 0738 03/26/19 0826  BP: (!) 137/100  (!) 115/112 125/84  Pulse: 99  (!) 113 95  Resp: 18  20   Temp: 98 F (36.7 C)  97.7 F (36.5 C)   TempSrc: Oral  Oral   SpO2: 94%   94%  Weight:  110.9 kg    Height:       Gen: sitting up in chair, NAD Cardiac: irregularly irregular, rate controlled Pulm: scattered expiratory wheezing, normal wob on RA Ext: no LEE MSK: left elbow swelling and drainage has improved. Minimally TTP, no drainage   Assessment/Plan:  Active Problems:   Atrial fibrillation with RVR (HCC)  68yoM with no known PMHx presenting withan infected left elbow joint and palpitations found to be in a. Fib with RVR.  A. Fib with RVR:  - denies chest pain, sob, palpitations - HR 70s-90s - increase coreg to 12.5mg  bid - continue eliquis   Septic bursitis, left elbow: - improving on IV ancef - afebrile, wound culture with few staph aureus, susceptibilities pending.  - d/c on po keflex to complete a 10 day course. If culture grows MRSA, will contact patient to switch antibiotics   Tobacco abuse:  - will need low dose chest CT as an outpatient  - concern for COPD given wheezing on exam and oxygen sats in the low 90s. Will f/u outpt    Dispo: Anticipated discharge in approximately today   [name], MD 03/26/2019, 10:52 AM Pager: [phone] [facility] PGY-1

## 2019-03-26 NOTE — Care Management (Signed)
Discussed prescriptions with patient to assess need for [facility] letter.  Cost [facility] will be approximately $50.  All medicines can be bought at other pharmacies for cheaper.  Patient states he is aware of [employer] and will have scripts transferred after today.  Pt advised on [facility] prescription program, which would make one of his medications free.  Pt will follow up.  He states he can pay the $50 today and does not need financial assistance.    Reinforced to patient that he will use the 30 day free card for Eliquis today and will f/u with the [facility] about patient assistance program.  He will inquire about signing up for Medicare, but understands he will likely not be able to do this until open enrollment in October, with benefit starting in January.

## 2019-03-26 NOTE — Discharge Summary (Signed)
Name: Sean Barnett MRN: [id number] DOB: Jun 16, 1951 68 y.o. PCP: Patient, No Pcp Per  Date of Admission: 03/24/2019  9:38 AM Date of Discharge: 03/26/19 Attending Physician: Inez Catalina, MD  Discharge Diagnosis: 1. A. Fib with RVR 2. HFrEF 3. Septic bursitis, left elbow 4. Tobacco abuse 5. HTN  Discharge Medications: Allergies as of 03/26/2019      Reactions   Sulfa Antibiotics Swelling   "like a balloon"      Medication List    TAKE these medications   apixaban 5 MG Tabs tablet Commonly known as:  ELIQUIS Take 1 tablet (5 mg total) by mouth 2 (two) times daily.   carvedilol 12.5 MG tablet Commonly known as:  COREG Take 1 tablet (12.5 mg total) by mouth 2 (two) times daily with a meal.   cephALEXin 500 MG capsule Commonly known as:  KEFLEX Take 1 capsule (500 mg total) by mouth 4 (four) times daily for 9 days.   hydrocortisone cream 1 % Apply 1 application topically 2 (two) times daily as needed for itching.   ibuprofen 200 MG tablet Commonly known as:  ADVIL Take 400 mg by mouth every 6 (six) hours as needed for headache (pain).   lisinopril 2.5 MG tablet Commonly known as:  ZESTRIL Take 1 tablet (2.5 mg total) by mouth daily. Start taking on:  March 27, 2019       Disposition and follow-up:   [name] was discharged from [facility] in Good condition.  At the hospital follow up visit please address:   Uninsured:   - needs to apply for orange card   - trying to get Medicare but inpatient case manager says it will be Jan 2021 before that happens  - please ensure he has adequate supply of all his medications, esp Eliquis  A. Fib with RVR  - remained in a.fib at discharge but he was rate controlled ~70bpm  - coreg 12.5mg  bid  - eliquis 5mg  bid   HFrEF, uncontrolled HTN  - EF 30-35%  - evaluate volume status, euvolemic at discharge  - increase lisinopril if tolerated   - consider starting statin therapy   Septic  bursitis, left elbow  - d/c'd with po keflex to end may 3rd  - extend antibiotic course if needed, can also consider I&D   - make sure pending susceptibilities were followed up on (wound culture with few staph species)  Tobacco abuse  - smokes 1+ ppd, counsel on cessation  - needs low dose CT scan for lung cancer screening but will need insurance first   - could also benefit from PFTs down the road  2.  Labs / imaging needed at time of follow-up: BMP (renal function and K after starting lisinopril), CBC (residula leukocytosis)  3.  Pending labs/ test needing follow-up: wound culture susceptibilities   Follow-up Appointments: Follow-up Information    [facility] Follow up.   Why:  Someone will call you to schedule an appointment Contact information: [address] [place] [address] [phone]          Hospital Course by problem list: [phone] without routine medical care who presented with left elbow septic arthritis, shortness of breath and found to be in a.fib with RVR.   A. Fib RVR: Likely due to uncontrolled HTN. TSH normal. A1c 5.9. No significant alcohol use. He does smoke 1+ ppd and is overweight. Suspicious for COPD and/or OSA. He was hemodynamically stable on admission.  Rate controlled with IV diltiazem. Anticoagulated with eliquis. Echo showed reduced EF 30-35% and dilated IVC. Given one dose IV lasix. Transitioned to po coreg 12.5mg  bid and lisinopril 2.5mg  qd. He remained in a. Fib at discharge.   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 Afebrile. Slight leukocytosis. Self draining on admission. Treated with IV ancef, transitioned to po keflex at discharge to complete a 10 day course. Wound culture grew few staph species, susceptibilities were pending at time of discharge.   Discharge Vitals:   BP (!) 155/120 (BP Location: Left Arm) Comment: RN notified  Pulse 95   Temp 97.7 F  (36.5 C) (Oral)   Resp 15   Ht 6\' 4"  (1.93 m)   Wt 110.9 kg Comment: scale b  SpO2 94%   BMI 29.76 kg/m   Pertinent Labs, Studies, and Procedures:   1. The left ventricle has moderate-severely reduced systolic function, with an ejection fraction of 30-35%. The cavity size was normal. Left ventricular diastolic function could not be evaluated secondary to atrial fibrillation. Indeterminate filling  pressures Left ventricular diffuse hypokinesis. Beat to beat variability in LVEF due to atrial fibrillation.  2. The right ventricle has mildly reduced systolic function. The cavity was normal. There is no increase in right ventricular wall thickness. Right ventricular systolic pressure could not be assessed.  3. The inferior vena cava was dilated in size with <50% respiratory variability.  TSH 2.45 A1c 5.9  Results for orders placed or performed during the hospital encounter of 03/24/19  Aerobic Culture (superficial specimen)     Status: None (Preliminary result)   Collection Time: 03/24/19 11:40 AM  Result Value Ref Range Status   Specimen Description CYSTS  Final   Special Requests BURSA  Final   Gram Stain NO WBC SEEN NO ORGANISMS SEEN   Final   Culture   Final    FEW STAPHYLOCOCCUS AUREUS SUSCEPTIBILITIES TO FOLLOW Performed at [facility], [address] [place], [place] [address]    Report Status PENDING  Incomplete  MRSA PCR Screening     Status: None   Collection Time: 03/24/19  2:14 PM  Result Value Ref Range Status   MRSA by PCR NEGATIVE NEGATIVE Final    Comment:        The GeneXpert MRSA Assay (FDA approved for NASAL specimens only), is one component of a comprehensive MRSA colonization surveillance program. It is not intended to diagnose MRSA infection nor to guide or monitor treatment for MRSA infections. Performed at [facility], [address] [place], [place] [address]      Discharge Instructions: Discharge Instructions    Diet - low  sodium heart healthy   Complete by:  As directed    Discharge instructions   Complete by:  As directed    Mr. Sean, Sean Barnett were admitted to the hospital because your heart was beating very fast and in an abnormal rhythm called atrial fibrillation (a. Fib). We were able to slow down your heart rate with some medicines which you will continue taking at home. We also did an ultrasound of your heart, which showed that your heart is not pumping as strongly as it should. We call this congestive heart failure (CHF). It is common for a. Fib and CHF to present together and I am hopeful that we can effectively treat both of these conditions, keeping you active and healthy and out of the hospital!  For you a. Fib: - take coreg (carvedilol) 12.5mg  twice a day, this helps to slow your heart rate down, allowing it to fill with blood and pump more volume out - Take eliquis (apixaban)  twice a day, this is a blood thinner which helps to decrease the chance of your body forming blood clots which could cause a stroke  For your CHF (heart failure): - take lisinopril 2.5mg  once a day, this dose will likely be increased at your follow up appointment - lisinopril helps your heart rebuild muscle and also helps control your blood pressure so that your heart isn't working against such high pressures to pump blood out - after you are on your medications for a couple months, we will get another heart ultrasound to see how much better your heart is pumping - If you notice worsening shortness of breath, swelling in your legs, or productive cough please call your doctor   For your elbow: - pick up the antibiotic from the pharmacy (Keflex or cephalexin). Take as prescribed until you complete the entire bottle.  - we will look at your elbow when you follow up with [place] in clinic and determine whether or not you need more antibiotics - if you notice fevers, chills, nausea, vomiting, diarrhea, worsening pain/swelling/discharge  from the elbow call your doctor   Insurance: - while you are awaiting Medicare, we will set you up with the financial counselor in our clinic and help you apply for our clinic financial assistance (we call it the "orange card")  Someone from our clinic will call you in the next few days to schedule an appointment. If anything comes up before then, please call our clinic! [facility]. During business hours, call [phone]. If after hours, call [phone] and ask for the internal medicine resident on call.   It's been a pleasure taking care of you!  Dr. Micael [facility]   Increase activity slowly   Complete by:  As directed       Signed: [name], MD 03/26/2019, 11:12 AM   Pager: [phone]

## 2019-03-30 ENCOUNTER — Other Ambulatory Visit: Payer: Self-pay

## 2019-03-30 ENCOUNTER — Telehealth: Payer: Self-pay | Admitting: General Practice

## 2019-03-30 ENCOUNTER — Telehealth: Payer: Self-pay | Admitting: Internal Medicine

## 2019-03-30 ENCOUNTER — Ambulatory Visit (INDEPENDENT_AMBULATORY_CARE_PROVIDER_SITE_OTHER): Payer: Medicaid Other | Admitting: Internal Medicine

## 2019-03-30 DIAGNOSIS — Z79899 Other long term (current) drug therapy: Secondary | ICD-10-CM | POA: Diagnosis not present

## 2019-03-30 DIAGNOSIS — R0600 Dyspnea, unspecified: Secondary | ICD-10-CM | POA: Diagnosis not present

## 2019-03-30 DIAGNOSIS — G4700 Insomnia, unspecified: Secondary | ICD-10-CM

## 2019-03-30 DIAGNOSIS — I5020 Unspecified systolic (congestive) heart failure: Secondary | ICD-10-CM

## 2019-03-30 DIAGNOSIS — I4891 Unspecified atrial fibrillation: Secondary | ICD-10-CM | POA: Diagnosis not present

## 2019-03-30 HISTORY — DX: Insomnia, unspecified: G47.00

## 2019-03-30 HISTORY — DX: Dyspnea, unspecified: R06.00

## 2019-03-30 NOTE — Telephone Encounter (Signed)
Pt was seen by Dr Petra Kuba in hospital, he is having problems with the medicine pls [phone]

## 2019-03-30 NOTE — Telephone Encounter (Signed)
appt made for 5/1

## 2019-03-30 NOTE — Telephone Encounter (Signed)
-----   Message from Levora Dredge, MD sent at 03/30/2019 11:30 AM EDT ----- Please call this patient to schedule an in person visit tomorrow for shortness of breath. Thank you.

## 2019-03-30 NOTE — Telephone Encounter (Signed)
Pt calls and states he is having some problems, states since discharge he only sleeps appr 2 hrs in 24 hrs, he feels tired lays down and he is wide awake. Has never experienced this before. Also he is having decreased taste sensation so he is eating less. He states he hates to complain but he needs some rest.  Please advise. Sending to dr's mullen, vogel and attending

## 2019-03-30 NOTE — Assessment & Plan Note (Signed)
HPI:  Patient describes significant dyspnea he states that he cannot walk across this house without becoming extremely short of breath and needing to rest. He states that this is worse since he left the hospital. He does not weigh himself daily. He has not noticed any lower extremity edema but has noticed abdominal distention. He was recently diagnosed with atrial fibrillation and heart failure reduced ejection fraction. He has been taking all his new medications as prescribed.  A/P: - He will come to clinic for an in person evaluation

## 2019-03-30 NOTE — Progress Notes (Signed)
   CC: Insomnia and dyspnea   This is a telephone encounter between Sean Barnett and Sean Barnett on 03/30/2019 for insomnia and dyspnea. The visit was conducted with the patient located at home and [facility] at [facility]. The patient's identity was confirmed using their DOB and current address. The patient has consented to being evaluated through a telephone encounter and understands the associated risks (an examination cannot be done and the patient may need to come in for an appointment) / benefits (allows the patient to remain at home, decreasing exposure to coronavirus). I personally spent 14 minutes on medical discussion.   HPI:  [name] is a 68 y.o. with PMH as below.   Please see A&P for assessment of the patient's acute and chronic medical conditions.   Past Medical History:  Diagnosis Date  . Atrial fibrillation (HCC)   . Bursitis of elbow 03/2019   left elbow  . DDD (degenerative disc disease), lumbar    Review of Systems:  Performed and all others negative.  Assessment & Plan:   See Encounters Tab for problem based charting.  Patient discussed with Dr. Heide Spark

## 2019-03-30 NOTE — Telephone Encounter (Signed)
Attempted to call patient this afternoon to schedule an appt for tomorrow, but no answer.  Left detailed message asking to please call us back.

## 2019-03-30 NOTE — Progress Notes (Signed)
Internal Medicine Clinic Attending  Case discussed with Dr. Helberg at the time of the visit.  We reviewed the resident's history and exam and pertinent patient test results.  I agree with the assessment, diagnosis, and plan of care documented in the resident's note.    

## 2019-03-30 NOTE — Telephone Encounter (Signed)
APPT 1115 TODAY

## 2019-03-30 NOTE — Assessment & Plan Note (Signed)
HPI:  Patient states that for the past several years he has had difficulty with insomnia. He describes difficulty with initiation and maintenance. States that over the past 24 hours he is gone approximately two hours of sleep. He does not nap during the day. He previously drank anywhere from 2 to 4 L of Pepsi per day but since leaving the hospital has significantly decreased his caffeine intake. He no longer drinks caffeine afternoon. He does not watch TV two hours before bedtime. He states that while he was in the hospital he received a sleeping pill that helped him to get additional sleep. He is wondering if he can take over-the-counter sleep aids.  A/P: - Difficulty with initiation and maintenance. May benefit from trazodone. He was like to try over-the-counter sleep aids start. Discussed using these with caution as many of them have Benadryl in them. - He will continue to work on sleep hygiene.

## 2019-03-30 NOTE — Telephone Encounter (Signed)
Agree 

## 2019-03-30 NOTE — Telephone Encounter (Signed)
Would set him up for [facility] tele visit. Thank you

## 2019-03-30 NOTE — Telephone Encounter (Signed)
Called pt - no answer; left message to call the office . 

## 2019-03-31 ENCOUNTER — Ambulatory Visit (INDEPENDENT_AMBULATORY_CARE_PROVIDER_SITE_OTHER): Payer: Medicaid Other | Admitting: Internal Medicine

## 2019-03-31 ENCOUNTER — Telehealth: Payer: Self-pay | Admitting: *Deleted

## 2019-03-31 ENCOUNTER — Other Ambulatory Visit: Payer: Self-pay

## 2019-03-31 VITALS — BP 127/108 | HR 85 | Temp 97.7°F | Wt 247.3 lb

## 2019-03-31 DIAGNOSIS — I5020 Unspecified systolic (congestive) heart failure: Secondary | ICD-10-CM | POA: Insufficient documentation

## 2019-03-31 DIAGNOSIS — Z79899 Other long term (current) drug therapy: Secondary | ICD-10-CM | POA: Diagnosis not present

## 2019-03-31 DIAGNOSIS — M71122 Other infective bursitis, left elbow: Secondary | ICD-10-CM

## 2019-03-31 DIAGNOSIS — B9561 Methicillin susceptible Staphylococcus aureus infection as the cause of diseases classified elsewhere: Secondary | ICD-10-CM

## 2019-03-31 DIAGNOSIS — R0600 Dyspnea, unspecified: Secondary | ICD-10-CM | POA: Diagnosis not present

## 2019-03-31 DIAGNOSIS — Z7901 Long term (current) use of anticoagulants: Secondary | ICD-10-CM | POA: Diagnosis not present

## 2019-03-31 DIAGNOSIS — I5021 Acute systolic (congestive) heart failure: Secondary | ICD-10-CM | POA: Diagnosis not present

## 2019-03-31 DIAGNOSIS — G4700 Insomnia, unspecified: Secondary | ICD-10-CM

## 2019-03-31 DIAGNOSIS — I4891 Unspecified atrial fibrillation: Secondary | ICD-10-CM

## 2019-03-31 DIAGNOSIS — I428 Other cardiomyopathies: Secondary | ICD-10-CM | POA: Insufficient documentation

## 2019-03-31 MED ORDER — CARVEDILOL 12.5 MG PO TABS: 12.5000 mg | ORAL_TABLET | Freq: Two times a day (BID) | ORAL | 3 refills | Status: DC

## 2019-03-31 MED ORDER — LISINOPRIL 5 MG PO TABS: 2.5000 mg | ORAL_TABLET | Freq: Every day | ORAL | 2 refills | Status: DC

## 2019-03-31 MED ORDER — CEPHALEXIN 500 MG PO CAPS: 500.0000 mg | ORAL_CAPSULE | Freq: Four times a day (QID) | ORAL | 0 refills | Status: AC

## 2019-03-31 MED ORDER — FUROSEMIDE 20 MG PO TABS: 20.0000 mg | ORAL_TABLET | Freq: Every day | ORAL | 11 refills | Status: DC

## 2019-03-31 MED ORDER — LISINOPRIL 5 MG PO TABS: 5.0000 mg | ORAL_TABLET | Freq: Every day | ORAL | 2 refills | Status: DC

## 2019-03-31 NOTE — Assessment & Plan Note (Signed)
Melatonin is helping with his insomnia.  Advised to continue taking melatonin as needed.

## 2019-03-31 NOTE — Telephone Encounter (Signed)
Pt called to confirm increase of lisinopril to 1 whole tablet- tot 5mg - of lisinopril daily. Script today stated 1/2 tablet daily, please change script, pt understands to take a whole tablet, medlist needs update

## 2019-03-31 NOTE — Progress Notes (Signed)
Internal Medicine Clinic Attending  Case discussed with Dr. Amin at the time of the visit.  We reviewed the resident's history and exam and pertinent patient test results.  I agree with the assessment, diagnosis, and plan of care documented in the resident's note.    

## 2019-03-31 NOTE — Patient Instructions (Signed)
Thank you for visiting clinic today. As we discussed I am increasing the dose of lisinopril to 5 mg daily, you can finish your existing prescription by taking 2 pills at a time and then start taking the new prescription. I am also adding a water pill called Lasix 20 mg daily to control your volume. Please weigh yourself daily and make a diary.  If you see an increase in your weight of 3 pound in 1 day or 5 pound in 1 week take an extra pill of Lasix and call our clinic. We are checking some labs today and will call you for any abnormal results. We will ask over pharmacist to call you regarding getting a patient assistance for Eliquis-we are giving you samples worth of 2 weeks today. I am also giving you 4 more days of antibiotics for your elbow, continue taking them until you will finish all the pills. Please follow-up in 2 weeks.

## 2019-03-31 NOTE — Addendum Note (Signed)
Addended by: Arnetha Courser on: 03/31/2019 04:40 PM   Modules accepted: Orders

## 2019-03-31 NOTE — Progress Notes (Signed)
   CC: Hospital follow-up-admitted due to A. fib with RVR.  HPI:  [name] is a 68 y.o. with past medical history as listed below came to the clinic to establish care and hospital follow-up.  Patient was recently admitted at [facility] from April 24 till 26, when he came there with shortness of breath and found to have A. fib with RVR.  Echo done during that visit shows reduced ejection fraction at 30 to 35%, suspicion for COPD and/or OSA.  Patient remained in A. fib but rate was controlled initially with IV diltiazem and he was discharged on Coreg 12.5 mg twice daily along with Eliquis for anticoagulation.  Since discharge he is having mild to moderate exertional dyspnea.  Denies any orthopnea or PND. Denies any palpitations or chest pain.  During current hospitalization he was also found to have septic bursitis of left elbow, incision and drainage was performed and cultures grew pansensitive staph aureus. He was sent home on Keflex total of 10-day course.  Since discharge his elbow swelling is improving, continue to get mild drainage.  See assessment and plan for his conditions.  Past Medical History:  Diagnosis Date  . Atrial fibrillation (HCC)   . Bursitis of elbow 03/2019   left elbow  . DDD (degenerative disc disease), lumbar    Review of Systems: Negative except mentioned in HPI.  Physical Exam:  Vitals:   03/31/19 1044  BP: (!) 127/108  Pulse: 85  Temp: 97.7 F (36.5 C)  TempSrc: Oral  SpO2: 96%  Weight: 247 lb 4.8 oz (112.2 kg)   Vitals:   03/31/19 1044  BP: (!) 127/108  Pulse: 85  Temp: 97.7 F (36.5 C)  TempSrc: Oral  SpO2: 96%  Weight: 247 lb 4.8 oz (112.2 kg)   General: Vital signs reviewed.  Patient is well-developed and well-nourished, in no acute distress and cooperative with exam.  Head: Normocephalic and atraumatic. Eyes: EOMI, conjunctivae normal, no scleral icterus.  Cardiovascular: Irregularly irregular rhythm, no murmurs, gallops,  or rubs. Pulmonary/Chest: Clear to auscultation bilaterally, no wheezes, rales, or rhonchi. Abdominal: Soft, non-tender, non-distended, BS +,  Musculoskeletal: Localized edema and erythema of left elbow with scant drainage. Extremities: 2+ lower extremity edema bilaterally,  pulses symmetric and intact bilaterally. No cyanosis or clubbing. Skin: Warm, dry and intact. No rashes or erythema. Psychiatric: Normal mood and affect. speech and behavior is normal. Cognition and memory are normal.  Assessment & Plan:   See Encounters Tab for problem based charting.  Patient discussed with Dr. Heide Spark.

## 2019-03-31 NOTE — Assessment & Plan Note (Signed)
New diagnosis of A. fib with RVR.  TSH within normal limit. Echo with reduced ejection fraction. A1c normal. There was some concern for obstructive sleep apnea.  He remained in A. fib with irregularly irregular heart rate but rate around 80s. Patient does not has insurance and unable to afford Eliquis unless enrolled in a patient assistance program.  -Continue carvedilol 12.5 mg twice daily. -Continue Eliquis 5 mg twice daily-sample worth of 2-week was provided from clinic. -We will contact Dr. Selena Batten to help him enroll in a patient assistant program for Eliquis.

## 2019-03-31 NOTE — Assessment & Plan Note (Addendum)
Patient with new diagnosis of heart failure with reduced ejection fraction.  BNP elevated. Patient continued to have mild to moderate exertional dyspnea but states that it is improving. Bilateral lower extremity edema but lungs were clear. Was discharged home on a low-dose of lisinopril 2.5 mg daily and carvedilol 12.5 mg twice daily. Patient does not has insurance and would like to get an orange card.  -Increase lisinopril to 5 mg daily. -Add Lasix 20 mg daily-can be uptitrated if needed. Instructed patient to check his weight daily and keep a diary, he should take an extra pill of Lasix if he sees a weight gain of 3 pounds in 1 day or 5 pound in 1 week. -Checking lipid panel today-most likely will need statin. -Continue carvedilol. -He did not had any ischemic work-up-we will need a cardiology referral once get orange card. -Also need sleep study to rule out sleep apnea as a cause of his heart failure. -We will need a BMP on follow-up in 2 weeks for electrolytes.

## 2019-03-31 NOTE — Telephone Encounter (Signed)
Did correct his prescription.

## 2019-04-01 LAB — BMP8+ANION GAP
Anion Gap: 15 mmol/L (ref 10.0–18.0)
BUN/Creatinine Ratio: 14 (ref 10–24)
BUN: 15 mg/dL (ref 8–27)
CO2: 25 mmol/L (ref 20–29)
Calcium: 9.4 mg/dL (ref 8.6–10.2)
Chloride: 98 mmol/L (ref 96–106)
Creatinine, Ser: 1.07 mg/dL (ref 0.76–1.27)
GFR calc Af Amer: 82 mL/min/{1.73_m2} (ref >59)
GFR calc non Af Amer: 71 mL/min/{1.73_m2} (ref >59)
Glucose: 96 mg/dL (ref 65–99)
Potassium: 5.1 mmol/L (ref 3.5–5.2)
Sodium: 138 mmol/L (ref 134–144)

## 2019-04-01 LAB — LIPID PANEL
Chol/HDL Ratio: 4.2 ratio (ref 0.0–5.0)
Cholesterol, Total: 101 mg/dL (ref 100–199)
HDL: 24 mg/dL — ABNORMAL LOW (ref >39)
LDL Calculated: 65 mg/dL (ref 0–99)
Triglycerides: 61 mg/dL (ref 0–149)
VLDL Cholesterol Cal: 12 mg/dL (ref 5–40)

## 2019-04-01 LAB — CBC
Hematocrit: 35.7 % — ABNORMAL LOW (ref 37.5–51.0)
Hemoglobin: 12 g/dL — ABNORMAL LOW (ref 13.0–17.7)
MCH: 29.6 pg (ref 26.6–33.0)
MCHC: 33.6 g/dL (ref 31.5–35.7)
MCV: 88 fL (ref 79–97)
Platelets: 312 10*3/uL (ref 150–450)
RBC: 4.05 x10E6/uL — ABNORMAL LOW (ref 4.14–5.80)
RDW: 14.4 % (ref 11.6–15.4)
WBC: 8.8 10*3/uL (ref 3.4–10.8)

## 2019-04-05 ENCOUNTER — Ambulatory Visit: Payer: Self-pay

## 2019-04-11 ENCOUNTER — Other Ambulatory Visit: Payer: Self-pay

## 2019-04-11 ENCOUNTER — Ambulatory Visit (INDEPENDENT_AMBULATORY_CARE_PROVIDER_SITE_OTHER): Payer: Medicaid Other | Admitting: Internal Medicine

## 2019-04-11 VITALS — BP 135/93 | HR 97 | Wt 245.9 lb

## 2019-04-11 DIAGNOSIS — I4891 Unspecified atrial fibrillation: Secondary | ICD-10-CM | POA: Diagnosis not present

## 2019-04-11 DIAGNOSIS — I5020 Unspecified systolic (congestive) heart failure: Secondary | ICD-10-CM | POA: Diagnosis not present

## 2019-04-11 DIAGNOSIS — M009 Pyogenic arthritis, unspecified: Secondary | ICD-10-CM

## 2019-04-11 DIAGNOSIS — I110 Hypertensive heart disease with heart failure: Secondary | ICD-10-CM

## 2019-04-11 DIAGNOSIS — Z7901 Long term (current) use of anticoagulants: Secondary | ICD-10-CM | POA: Diagnosis not present

## 2019-04-11 DIAGNOSIS — M71122 Other infective bursitis, left elbow: Secondary | ICD-10-CM

## 2019-04-11 DIAGNOSIS — Z792 Long term (current) use of antibiotics: Secondary | ICD-10-CM

## 2019-04-11 DIAGNOSIS — Z79899 Other long term (current) drug therapy: Secondary | ICD-10-CM | POA: Diagnosis not present

## 2019-04-11 DIAGNOSIS — I10 Essential (primary) hypertension: Secondary | ICD-10-CM

## 2019-04-11 NOTE — Patient Instructions (Addendum)
Thank you for coming to the clinic today. It was a pleasure to see you.    Please schedule a follow up visit with your primary care doctor within the next 3-6 months.  Continue to check your weights daily, call us if you have any changes in symptoms before your follow up appointment.  When you get the orange card call and let our office know so that we can place the referral to cardiology.  Please call and let us let us know if you need anything!   Please call the internal medicine center clinic if you have any questions or concerns, we may be able to help and keep you from a long and expensive emergency room wait. Our clinic and after hours phone number is [phone], the best time to call is Monday through Friday 9 am to 4 pm but there is always someone available 24/7 if you have an emergency. If you need medication refills please notify your pharmacy one week in advance and they will send [place] a request.

## 2019-04-11 NOTE — Progress Notes (Signed)
   CC: follow up of afib   HPI:  Sean Barnett is a 68 y.o. with PMH as listed below who presents for follow up of afib. Please see the assessment and plans for the status of the patient chronic medical problems.    Past Medical History:  Diagnosis Date  . Atrial fibrillation (HCC)   . Bursitis of elbow 03/2019   left elbow  . DDD (degenerative disc disease), lumbar    Review of Systems:  Refer to history of present illness and assessment and plans for pertinent review of systems, all others reviewed and negative  Physical Exam:  Vitals:   04/11/19 0857  BP: (!) 135/93  Pulse: 97  SpO2: 97%  Weight: 245 lb 14.4 oz (111.5 kg)   General: well appearing, not in acute distress  Cardiac: irregular rate and rhythm, no murmurs, rubs or gallops, trace right lower extremity edema, no left lower extremity edema Pulmonary: lungs clear to auscultation, normal work of breathing  MSK: left elbow with small   Assessment & Plan:   Uninsured status  - patient submit orange card application paperwork over the weekend   HFrEF  Hypertension  Patient who was hospitalized for atrial fibrillation with RVR and HFrEF in April presented for hospital follow up about two weeks ago and was describing persistent dyspnea since the time of hospitalization. He was found to be in afib and had 2+ lower extremity edema. He was started on Lasix 20 mg daily. Since the time of the prior visit dyspnea has resolved. Has been checking daily weight consistently, at first he noticed he was loosing about 1/2 pound per day then this weekend it began to stabilize. Exam today shows improvement in lower extremity edema. Crt and K checked today and remain within the normal range  A: controlled CHF, blood pressure not yet at goal  - continue lasix, carvedilol - increase lisinopril to 10 mg qd ( previously this was 5 mg daily)  - plan for cardiology referral for ischemic evaluation when he has the orange card  Atrial  fibrillation  Rate controlled atrial fibrillation today. Denies symptoms of chest pain, sob, palpitations. Reports he has plenty of eliquis   - continue eliquis, carvedilol    Septic Bursitis  Elbow is feeling a great deal better, now only tenderness when he hits it against something. Completed full course of antibiotics. Still some mild swelling on exam but  A: healing septic bursitis  - continue to monitor   See Encounters Tab for problem based charting.  Patient discussed with Dr. Cyndie Chime

## 2019-04-12 LAB — BMP8+ANION GAP
Anion Gap: 15 mmol/L (ref 10.0–18.0)
BUN/Creatinine Ratio: 16 (ref 10–24)
BUN: 16 mg/dL (ref 8–27)
CO2: 22 mmol/L (ref 20–29)
Calcium: 8.7 mg/dL (ref 8.6–10.2)
Chloride: 101 mmol/L (ref 96–106)
Creatinine, Ser: 1.02 mg/dL (ref 0.76–1.27)
GFR calc Af Amer: 87 mL/min/{1.73_m2} (ref >59)
GFR calc non Af Amer: 75 mL/min/{1.73_m2} (ref >59)
Glucose: 105 mg/dL — ABNORMAL HIGH (ref 65–99)
Potassium: 4.4 mmol/L (ref 3.5–5.2)
Sodium: 138 mmol/L (ref 134–144)

## 2019-04-13 ENCOUNTER — Encounter: Payer: Self-pay | Admitting: Internal Medicine

## 2019-04-13 DIAGNOSIS — M71122 Other infective bursitis, left elbow: Secondary | ICD-10-CM

## 2019-04-13 DIAGNOSIS — I10 Essential (primary) hypertension: Secondary | ICD-10-CM | POA: Insufficient documentation

## 2019-04-13 HISTORY — DX: Other infective bursitis, left elbow: M71.122

## 2019-04-13 MED ORDER — LISINOPRIL 10 MG PO TABS: 5.0000 mg | ORAL_TABLET | Freq: Every day | ORAL | 1 refills | Status: DC

## 2019-04-13 NOTE — Assessment & Plan Note (Signed)
BP Readings from Last 3 Encounters:  04/11/19 (!) 135/93  03/31/19 (!) 127/108  03/26/19 (!) 155/120   A: Blood pressure not yet at goal   P: Increase lisinopril to 10 mg daily ( previously this was 5 mg daily)

## 2019-04-13 NOTE — Assessment & Plan Note (Signed)
Sean Barnett is feeling a great deal better, now only tenderness when he hits it against something. Completed full course of antibiotics. Still some mild swelling on exam but  A: healing septic bursitis  - continue to monitor

## 2019-04-13 NOTE — Assessment & Plan Note (Signed)
Rate controlled atrial fibrillation today. Denies symptoms of chest pain, sob, palpitations. Reports he has plenty of eliquis   - continue eliquis, carvedilol

## 2019-04-13 NOTE — Assessment & Plan Note (Addendum)
Patient who was hospitalized for atrial fibrillation with RVR and HFrEF in April presented for hospital follow up about two weeks ago and was describing persistent dyspnea since the time of hospitalization. He was found to be in afib and had 2+ lower extremity edema. He was started on Lasix 20 mg daily. Since the time of the prior visit dyspnea has resolved. Has been checking daily weight consistently, at first he noticed he was loosing about 1/2 pound per day then this weekend it began to stabilize. Exam today shows improvement in lower extremity edema. Crt and K checked today and remain within the normal range  A: controlled CHF, blood pressure not yet at goal  - continue lasix, carvedilol - increase lisinopril to 10 mg qd ( previously this was 5 mg daily)  - plan for cardiology referral for ischemic evaluation when he has the orange card

## 2019-04-13 NOTE — Progress Notes (Signed)
Medicine attending: Medical history, presenting problems, physical findings, and medications, reviewed with resident physician Dr Eulah Pont on the day of the patient visit and I concur with her evaluation and management plan. Short interim F/U 68 y/o man w recent 03/24/19 admit for septic L olecranon bursitis; found to be in new A fib, RVR, systolic CHF, EF 47-09%. Infections Rxd & resolved. Rate conrol of A fib; diuretic being titrated for CHF w resolution of sxs. Will need formal Cardiology eval when insurance issues resolved.

## 2019-04-16 ENCOUNTER — Other Ambulatory Visit: Payer: Self-pay | Admitting: Internal Medicine

## 2019-04-18 ENCOUNTER — Telehealth: Payer: Self-pay | Admitting: Pharmacist

## 2019-04-18 DIAGNOSIS — I4891 Unspecified atrial fibrillation: Secondary | ICD-10-CM

## 2019-04-18 NOTE — Progress Notes (Addendum)
04/18/2019: spoke to patient to try to help with Eliquis affordability. Patient reports no insurance, household size 1, income $20,000. Would qualify for manufacturer patient assistance. Advised patient to fax me income statement in order to apply for program. Patient verbalized understanding. Will follow up with patient.   04/20/2019: patient called back and notified me he received a Medicaid approval letter. Sent Eliquis prescription to [facility] with Medicaid ID information and advised patient to contact me if further assistance is needed. Patient verbalized understanding.

## 2019-04-20 MED ORDER — APIXABAN 5 MG PO TABS: 5.0000 mg | ORAL_TABLET | Freq: Two times a day (BID) | ORAL | 3 refills | Status: DC

## 2019-04-20 NOTE — Addendum Note (Signed)
Addended by: Mliss Fritz on: 04/20/2019 03:34 PM   Modules accepted: Orders

## 2019-04-21 NOTE — Telephone Encounter (Signed)
Thank you for your help.

## 2019-04-26 ENCOUNTER — Other Ambulatory Visit: Payer: Self-pay | Admitting: *Deleted

## 2019-04-26 NOTE — Telephone Encounter (Signed)
Received return call-pt states he is using [facility] at this time.  No further action needed as pt already has refills there.  Will [facility] from [facility]'s [facility].  Phone call complete.Sean Spittle Cassady5/27/202011:56 AM

## 2019-04-26 NOTE — Telephone Encounter (Signed)
Received faxed refill request from pt's pharmacy for carvedilol 12.5mg  tabs from [facility] [address]  Per pt's EMR, last rx was sent to [facility] on 03/31/19 with 3 additional refills.  Call made to patient to confirm pharmacy-no answer, message left on recorder.  Of note, pt can contact pharmacy to have rx transferred.Sean Spittle Cassady5/27/202011:39 AM

## 2019-05-11 ENCOUNTER — Ambulatory Visit: Payer: Medicaid Other | Admitting: Pharmacist

## 2019-05-11 ENCOUNTER — Other Ambulatory Visit: Payer: Self-pay

## 2019-05-11 DIAGNOSIS — I4891 Unspecified atrial fibrillation: Secondary | ICD-10-CM

## 2019-05-11 NOTE — Progress Notes (Addendum)
Medication Samples have been provided to the patient.       Apixaban 5mg  June 2022 [id number]     The patient has been instructed regarding the correct time, dose, and frequency of taking this medication, including desired effects and most common side effects. He states he has Medicaid but is awaiting the card, advised patient to contact clinic if further concerns and he verbalized understanding.   Sean Barnett 9:19 AM 05/11/2019

## 2019-05-24 ENCOUNTER — Other Ambulatory Visit: Payer: Self-pay

## 2019-05-24 NOTE — Telephone Encounter (Signed)
Requesting to speak with a nurse about lisinopril. Please call pt back.

## 2019-05-24 NOTE — Telephone Encounter (Signed)
RTC, no answer, LM to call back. SChaplin, RN,BSN

## 2019-05-31 NOTE — Telephone Encounter (Signed)
Attempted callback lm for rtc

## 2019-07-11 ENCOUNTER — Ambulatory Visit: Payer: Medicaid Other | Admitting: Internal Medicine

## 2019-07-11 ENCOUNTER — Encounter: Payer: Self-pay | Admitting: Internal Medicine

## 2019-07-11 ENCOUNTER — Other Ambulatory Visit: Payer: Self-pay

## 2019-07-11 VITALS — BP 132/84 | HR 91 | Temp 98.2°F | Wt 236.4 lb

## 2019-07-11 DIAGNOSIS — Z79899 Other long term (current) drug therapy: Secondary | ICD-10-CM

## 2019-07-11 DIAGNOSIS — R350 Frequency of micturition: Secondary | ICD-10-CM

## 2019-07-11 DIAGNOSIS — I110 Hypertensive heart disease with heart failure: Secondary | ICD-10-CM

## 2019-07-11 DIAGNOSIS — I5020 Unspecified systolic (congestive) heart failure: Secondary | ICD-10-CM

## 2019-07-11 DIAGNOSIS — I10 Essential (primary) hypertension: Secondary | ICD-10-CM

## 2019-07-11 DIAGNOSIS — F17210 Nicotine dependence, cigarettes, uncomplicated: Secondary | ICD-10-CM | POA: Diagnosis not present

## 2019-07-11 DIAGNOSIS — Z0000 Encounter for general adult medical examination without abnormal findings: Secondary | ICD-10-CM | POA: Diagnosis not present

## 2019-07-11 DIAGNOSIS — Z7901 Long term (current) use of anticoagulants: Secondary | ICD-10-CM | POA: Diagnosis not present

## 2019-07-11 DIAGNOSIS — I4891 Unspecified atrial fibrillation: Secondary | ICD-10-CM | POA: Diagnosis not present

## 2019-07-11 DIAGNOSIS — Z23 Encounter for immunization: Secondary | ICD-10-CM

## 2019-07-11 DIAGNOSIS — R002 Palpitations: Secondary | ICD-10-CM | POA: Diagnosis not present

## 2019-07-11 DIAGNOSIS — Z720 Tobacco use: Secondary | ICD-10-CM | POA: Insufficient documentation

## 2019-07-11 MED ORDER — FUROSEMIDE 20 MG PO TABS: 20.0000 mg | ORAL_TABLET | Freq: Every day | ORAL | 11 refills | Status: DC

## 2019-07-11 MED ORDER — LISINOPRIL 10 MG PO TABS: 20.0000 mg | ORAL_TABLET | Freq: Every day | ORAL | 2 refills | Status: DC

## 2019-07-11 MED ORDER — APIXABAN 5 MG PO TABS: 5.0000 mg | ORAL_TABLET | Freq: Two times a day (BID) | ORAL | 3 refills | Status: DC

## 2019-07-11 MED ORDER — CARVEDILOL 25 MG PO TABS: 25.0000 mg | ORAL_TABLET | Freq: Two times a day (BID) | ORAL | 3 refills | Status: DC

## 2019-07-11 NOTE — Assessment & Plan Note (Signed)
History of tobacco use. Currently pack daily. >30 pack year smoking history. Discussed risks and benefits of low-dose CT for lung cancer screening. States would like to defer at this time. Continued to encourage smoking cessation. Sean Barnett states he is trying and denies additional smoking cessation aid at this time.  - C/w smoking cessation counseling - Consider low-dose CT for lung cancer screening in the future

## 2019-07-11 NOTE — Assessment & Plan Note (Signed)
Agrees to Hep C screening and Tdap vaccine today but unfortunately clinic out of Tdap. Declined colon cancer screening and pneumovar.

## 2019-07-11 NOTE — Progress Notes (Signed)
CC: Heart Failure  HPI: Sean Barnett is a 68 y.o. F w/ new diagnosis of half rough, A. fib with RVR, recent septic bursitis now resolved, hypertension presenting to [facility] for management of his chronic conditions.  He states that since his last visit 3 months ago he has had no acute complaints.  He mentions continued occasional nighttime awakenings.  He mentions he believes this may be due to his Pepsi comes consumption.  He also mentions that he takes his Lasix in the morning instead of nighttime but still needs to go to the bathroom.  He mentions that when he wakes he please alternate his computer and has difficulty try to go back to bed afterwards.  He mentions that he has been approved for Medicaid and understand that he needs to establish care with heart failure clinic.  He denies any chest pain, dyspnea, orthopnea.  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  He does mention some occasional episodes of palpitations without chest pain.  She states that he has been taking his Eliquis without difficulty.  He denies any melena, hematuria, hemoptysis.  He mentions that he has been losing weight steadily since he has been on furosemide.  Past Medical History:  Diagnosis Date  . Atrial fibrillation ([place])   . Bursitis of elbow 03/2019   left elbow  . DDD (degenerative disc disease), lumbar   . Dyspnea 03/30/2019  . Insomnia 03/30/2019  . Septic bursitis of elbow, left 04/13/2019   Review of Systems: Review of Systems  Constitutional: Positive for weight loss. Negative for chills, fever and malaise/fatigue.  Respiratory: Negative for cough and shortness of breath.   Cardiovascular: Positive for palpitations. Negative for chest pain, orthopnea and leg swelling.  Gastrointestinal: Negative for constipation, diarrhea, nausea and vomiting.  Genitourinary: Positive for frequency. Negative for hematuria and urgency.  Neurological: Negative for dizziness and  weakness.     Physical Exam: Vitals:   07/11/19 1314  BP: 132/84  Pulse: 91  Temp: 98.2 F (36.8 C)  TempSrc: Oral  SpO2: 97%  Weight: 236 lb 6.4 oz (107.2 kg)   Physical Exam  Constitutional: He is oriented to person, place, and time. He appears well-developed and well-nourished. No distress.  Eyes: Conjunctivae are normal. No scleral icterus.  Neck: Normal range of motion. Neck supple. No JVD present.  Cardiovascular: Normal heart sounds and intact distal pulses.  No murmur heard. Irregularly irregular  Respiratory: Effort normal and breath sounds normal. He has no wheezes. He has no rales.  GI: Soft. Bowel sounds are normal. There is no abdominal tenderness.  Musculoskeletal: Normal range of motion.        General: Edema (trace pitting edema) present.  Neurological: He is alert and oriented to person, place, and time.  Skin: Skin is warm and dry.    Assessment & Plan:   Atrial fibrillation with RVR (HCC) Currently rate controlled at HR of 98. Has been in upper 90s since first diagnosis in April. Denies any chest pain, dyspnea. Does experience occasional palpitations. Taking Eliquis without difficulty. Denies any obvious bleeding event. Unclear etiology. Does have Stop-Bang score of 3 w/ high risk for OSA but denies any snoring or fatigue.  - C/w eliquis - increase carvedilol to 25mg  BID - Declined sleep study at this time  Systolic heart failure (HCC) Presents w/ recent diagnosis of HFrEF. Was awaiting insurance coverage prior to ischemic work-up. Currently euvolemic on exam. Minimal dyspnea on exertion. NYHA  Class 2. Current weight 236lbs from 245lbs at last visit.  - BMP today - C/w furosemide 20mg  daily, lisinopril 20mg  daily - CHF clinic referral for ischemic work-up  Healthcare maintenance Agrees to Hep C screening and Tdap vaccine today but unfortunately clinic out of Tdap. Declined colon cancer screening and pneumovar.  Hypertension BP Readings from Last 3  Encounters:  07/11/19 132/84  04/11/19 (!) 135/93  03/31/19 (!) 127/108    - Above goal - Increase lisinopril to 20mg  daily  Tobacco use History of tobacco use. Currently pack daily. >30 pack year smoking history. Discussed risks and benefits of low-dose CT for lung cancer screening. States would like to defer at this time. Continued to encourage smoking cessation. Mr.Hemphill states he is trying and denies additional smoking cessation aid at this time.  - C/w smoking cessation counseling - Consider low-dose CT for lung cancer screening in the future    Patient discussed with Dr. Oswaldo Done   -Judeth Cornfield, PGY2 [facility] Internal Medicine Pager: [phone]

## 2019-07-11 NOTE — Assessment & Plan Note (Addendum)
Currently rate controlled at HR of 98. Has been in upper 90s since first diagnosis in April. Denies any chest pain, dyspnea. Does experience occasional palpitations. Taking Eliquis without difficulty. Denies any obvious bleeding event. Unclear etiology. Does have Stop-Bang score of 3 w/ high risk for OSA but denies any snoring or fatigue.  - C/w eliquis - increase carvedilol to 25mg  BID - Declined sleep study at this time

## 2019-07-11 NOTE — Assessment & Plan Note (Signed)
BP Readings from Last 3 Encounters:  07/11/19 132/84  04/11/19 (!) 135/93  03/31/19 (!) 127/108    - Above goal - Increase lisinopril to 20mg  daily

## 2019-07-11 NOTE — Assessment & Plan Note (Signed)
Presents w/ recent diagnosis of HFrEF. Was awaiting insurance coverage prior to ischemic work-up. Currently euvolemic on exam. Minimal dyspnea on exertion. NYHA Class 2. Current weight 236lbs from 245lbs at last visit.  - BMP today - C/w furosemide 20mg  daily, lisinopril 20mg  daily - CHF clinic referral for ischemic work-up

## 2019-07-11 NOTE — Addendum Note (Signed)
Addended by: Marcelino Duster on: 07/11/2019 03:05 PM   Modules accepted: Orders

## 2019-07-11 NOTE — Patient Instructions (Addendum)
Thank you for allowing [place] to provide your care today. Today we discussed your heart and sleep    I have ordered bmp, cbc, hep C labs for you. I will call if any are abnormal.    Today we made the following changes to your medications:  - Please take carvedilol 25mg  twice daily - Please take lisinopril at 20mg  as previously prescribed - Take all of your other medications as prescribed    Please follow-up in 3 months.    Should you have any questions or concerns please call the internal medicine clinic at [phone].      Smoking Tobacco Information, Adult Smoking tobacco can be harmful to your health. Tobacco contains a poisonous (toxic), colorless chemical called nicotine. Nicotine is addictive. It changes the brain and can make it hard to stop smoking. Tobacco also has other toxic chemicals that can hurt your body and raise your risk of many cancers. How can smoking tobacco affect me? Smoking tobacco puts you at risk for:  Cancer. Smoking is most commonly associated with lung cancer, but can also lead to cancer in other parts of the body.  Chronic obstructive pulmonary disease (COPD). This is a long-term lung condition that makes it hard to breathe. It also gets worse over time.  High blood pressure (hypertension), heart disease, stroke, or heart attack.  Lung infections, such as pneumonia.  Cataracts. This is when the lenses in the eyes become clouded.  Digestive problems. This may include peptic ulcers, heartburn, and gastroesophageal reflux disease (GERD).  Oral health problems, such as gum disease and tooth loss.  Loss of taste and smell. Smoking can affect your appearance by causing:  Wrinkles.  Yellow or stained teeth, fingers, and fingernails. Smoking tobacco can also affect your social life, because:  It may be challenging to find places to smoke when away from home. Many workplaces, [employer], hotels, and public places are tobacco-free.  Smoking is  expensive. This is due to the cost of tobacco and the long-term costs of treating health problems from smoking.  Secondhand smoke may affect those around you. Secondhand smoke can cause lung cancer, breathing problems, and heart disease. Children of smokers have a higher risk for: ? Sudden infant death syndrome (SIDS). ? Ear infections. ? Lung infections. If you currently smoke tobacco, quitting now can help you:  Lead a longer and healthier life.  Look, smell, breathe, and feel better over time.  Save money.  Protect others from the harms of secondhand smoke. What actions can I take to prevent health problems? Quit smoking   Do not start smoking. Quit if you already do.  Make a plan to quit smoking and commit to it. Look for programs to help you and ask your health care provider for recommendations and ideas.  Set a date and write down all the reasons you want to quit.  Let your friends and family know you are quitting so they can help and support you. Consider finding friends who also want to quit. It can be easier to quit with someone else, so that you can support each other.  Talk with your health care provider about using nicotine replacement medicines to help you quit, such as gum, lozenges, patches, sprays, or pills.  Do not replace cigarette smoking with electronic cigarettes, which are commonly called e-cigarettes. The safety of e-cigarettes is not known, and some may contain harmful chemicals.  If you try to quit but return to smoking, stay positive. It is common to slip  up when you first quit, so take it one day at a time.  Be prepared for cravings. When you feel the urge to smoke, chew gum or suck on hard candy. Lifestyle  Stay busy and take care of your body.  Drink enough fluid to keep your urine pale yellow.  Get plenty of exercise and eat a healthy diet. This can help prevent weight gain after quitting.  Monitor your eating habits. Quitting smoking can cause  you to have a larger appetite than when you smoke.  Find ways to relax. Go out with friends or family to a movie or a restaurant where people do not smoke.  Ask your health care provider about having regular tests (screenings) to check for cancer. This may include blood tests, imaging tests, and other tests.  Find ways to manage your stress, such as meditation, yoga, or exercise. Where to find support To get support to quit smoking, consider:  Asking your health care provider for more information and resources.  Taking classes to learn more about quitting smoking.  Looking for local organizations that offer resources about quitting smoking.  Joining a support group for people who want to quit smoking in your local community.  Calling the smokefree.gov counselor helpline: 1-800-Quit-Now (352)798-2935) Where to find more information You may find more information about quitting smoking from:  HelpGuide.org: www.helpguide.org  BankRights.uy: smokefree.gov  American Lung Association: www.lung.org Contact a health care provider if you:  Have problems breathing.  Notice that your lips, nose, or fingers turn blue.  Have chest pain.  Are coughing up blood.  Feel faint or you pass out.  Have other health changes that cause you to worry. Summary  Smoking tobacco can negatively affect your health, the health of those around you, your finances, and your social life.  Do not start smoking. Quit if you already do. If you need help quitting, ask your health care provider.  Think about joining a support group for people who want to quit smoking in your local community. There are many effective programs that will help you to quit this behavior. This information is not intended to replace advice given to you by your health care provider. Make sure you discuss any questions you have with your health care provider. Document Released: 12/01/2016 Document Revised: 01/05/2018 Document  Reviewed: 12/01/2016 Elsevier Patient Education  2020 [employer].

## 2019-07-12 LAB — BMP8+ANION GAP
Anion Gap: 18 mmol/L (ref 10.0–18.0)
BUN/Creatinine Ratio: 15 (ref 10–24)
BUN: 16 mg/dL (ref 8–27)
CO2: 21 mmol/L (ref 20–29)
Calcium: 9.7 mg/dL (ref 8.6–10.2)
Chloride: 101 mmol/L (ref 96–106)
Creatinine, Ser: 1.1 mg/dL (ref 0.76–1.27)
GFR calc Af Amer: 79 mL/min/{1.73_m2} (ref >59)
GFR calc non Af Amer: 69 mL/min/{1.73_m2} (ref >59)
Glucose: 87 mg/dL (ref 65–99)
Potassium: 4.5 mmol/L (ref 3.5–5.2)
Sodium: 140 mmol/L (ref 134–144)

## 2019-07-12 LAB — CBC
Hematocrit: 40 % (ref 37.5–51.0)
Hemoglobin: 13.1 g/dL (ref 13.0–17.7)
MCH: 28.5 pg (ref 26.6–33.0)
MCHC: 32.8 g/dL (ref 31.5–35.7)
MCV: 87 fL (ref 79–97)
Platelets: 274 10*3/uL (ref 150–450)
RBC: 4.59 x10E6/uL (ref 4.14–5.80)
RDW: 16.9 % — ABNORMAL HIGH (ref 11.6–15.4)
WBC: 8.5 10*3/uL (ref 3.4–10.8)

## 2019-07-12 LAB — HEPATITIS C ANTIBODY: Hep C Virus Ab: 0.1 s/co ratio (ref 0.0–0.9)

## 2019-07-12 NOTE — Progress Notes (Signed)
Internal Medicine Clinic Attending ° °Case discussed with Dr. Lee at the time of the visit.  We reviewed the resident’s history and exam and pertinent patient test results.  I agree with the assessment, diagnosis, and plan of care documented in the resident’s note.  °

## 2019-07-12 NOTE — Addendum Note (Signed)
Addended by: Lalla Brothers T on: 07/12/2019 11:29 AM   Modules accepted: Level of Service

## 2019-07-14 ENCOUNTER — Telehealth: Payer: Self-pay | Admitting: Internal Medicine

## 2019-07-14 NOTE — Telephone Encounter (Signed)
Spoke with Mr.Hallmark regarding his lab results. Discussed again also importance of smoking cessation. Mr.Galan expressed understanding.

## 2019-08-14 ENCOUNTER — Encounter: Payer: Self-pay | Admitting: *Deleted

## 2019-08-18 ENCOUNTER — Telehealth (HOSPITAL_COMMUNITY): Payer: Self-pay | Admitting: *Deleted

## 2019-08-18 NOTE — Telephone Encounter (Signed)
Pt left VM stating he had a referral to be seen in our clinic. Message sent to Kevan Rosebush, RN as she handles all new patient referrals.

## 2019-08-23 NOTE — Telephone Encounter (Signed)
Pt is sch to see Dr Aundra Dubin on 9/29

## 2019-08-29 ENCOUNTER — Other Ambulatory Visit: Payer: Self-pay

## 2019-08-29 ENCOUNTER — Ambulatory Visit (HOSPITAL_COMMUNITY)
Admission: RE | Admit: 2019-08-29 | Discharge: 2019-08-29 | Disposition: A | Discharge status: Home / Self Care | Payer: Medicaid Other | Source: Ambulatory Visit | Attending: Cardiology | Admitting: Cardiology

## 2019-08-29 ENCOUNTER — Encounter (HOSPITAL_COMMUNITY): Payer: Self-pay | Admitting: Cardiology

## 2019-08-29 VITALS — BP 148/70 | HR 84 | Wt 232.8 lb

## 2019-08-29 DIAGNOSIS — I110 Hypertensive heart disease with heart failure: Secondary | ICD-10-CM | POA: Insufficient documentation

## 2019-08-29 DIAGNOSIS — I482 Chronic atrial fibrillation, unspecified: Secondary | ICD-10-CM | POA: Diagnosis not present

## 2019-08-29 DIAGNOSIS — I4819 Other persistent atrial fibrillation: Secondary | ICD-10-CM | POA: Insufficient documentation

## 2019-08-29 DIAGNOSIS — I4510 Unspecified right bundle-branch block: Secondary | ICD-10-CM | POA: Diagnosis not present

## 2019-08-29 DIAGNOSIS — F17210 Nicotine dependence, cigarettes, uncomplicated: Secondary | ICD-10-CM | POA: Diagnosis not present

## 2019-08-29 DIAGNOSIS — Z801 Family history of malignant neoplasm of trachea, bronchus and lung: Secondary | ICD-10-CM | POA: Diagnosis not present

## 2019-08-29 DIAGNOSIS — I5022 Chronic systolic (congestive) heart failure: Secondary | ICD-10-CM

## 2019-08-29 DIAGNOSIS — R9431 Abnormal electrocardiogram [ECG] [EKG]: Secondary | ICD-10-CM | POA: Insufficient documentation

## 2019-08-29 DIAGNOSIS — Z8249 Family history of ischemic heart disease and other diseases of the circulatory system: Secondary | ICD-10-CM | POA: Insufficient documentation

## 2019-08-29 DIAGNOSIS — Z79899 Other long term (current) drug therapy: Secondary | ICD-10-CM | POA: Insufficient documentation

## 2019-08-29 DIAGNOSIS — Z7901 Long term (current) use of anticoagulants: Secondary | ICD-10-CM | POA: Diagnosis not present

## 2019-08-29 LAB — BASIC METABOLIC PANEL
Anion gap: 8 (ref 5–15)
BUN: 15 mg/dL (ref 8–23)
CO2: 26 mmol/L (ref 22–32)
Calcium: 8.8 mg/dL — ABNORMAL LOW (ref 8.9–10.3)
Chloride: 104 mmol/L (ref 98–111)
Creatinine, Ser: 1.03 mg/dL (ref 0.61–1.24)
GFR calc Af Amer: >60 mL/min (ref >60)
GFR calc non Af Amer: >60 mL/min (ref >60)
Glucose, Bld: 98 mg/dL (ref 70–99)
Potassium: 4.1 mmol/L (ref 3.5–5.1)
Sodium: 138 mmol/L (ref 135–145)

## 2019-08-29 LAB — CBC
HCT: 41.5 % (ref 39.0–52.0)
Hemoglobin: 13.9 g/dL (ref 13.0–17.0)
MCH: 30.1 pg (ref 26.0–34.0)
MCHC: 33.5 g/dL (ref 30.0–36.0)
MCV: 89.8 fL (ref 80.0–100.0)
Platelets: 265 10*3/uL (ref 150–400)
RBC: 4.62 MIL/uL (ref 4.22–5.81)
RDW: 16 % — ABNORMAL HIGH (ref 11.5–15.5)
WBC: 8.4 10*3/uL (ref 4.0–10.5)
nRBC: 0 % (ref 0.0–0.2)

## 2019-08-29 LAB — BRAIN NATRIURETIC PEPTIDE: B Natriuretic Peptide: 737.2 pg/mL — ABNORMAL HIGH (ref 0.0–100.0)

## 2019-08-29 MED ORDER — ENTRESTO 24-26 MG PO TABS: 1.0000 | ORAL_TABLET | Freq: Two times a day (BID) | ORAL | 5 refills | Status: DC

## 2019-08-29 NOTE — Patient Instructions (Addendum)
Labs done today. We will contact you only if your labs are abnormal.  STOP Lisinopril  START Entresto 24-26mg (1 tab) by mouth two times daily. DO NOT START UNTIL Thursday 08/31/2019  Your physician recommends that you schedule a follow-up appointment in: in one month to see Dr. Aundra Dubin.  Your physician has requested that you have a cardiac catheterization. Cardiac catheterization is used to diagnose and/or treat various heart conditions. Doctors may recommend this procedure for a number of different reasons. The most common reason is to evaluate chest pain. Chest pain can be a symptom of coronary artery disease (CAD), and cardiac catheterization can show whether plaque is narrowing or blocking your heart's arteries. This procedure is also used to evaluate the valves, as well as measure the blood flow and oxygen levels in different parts of your heart. For further information please visit HugeFiesta.tn. Please follow instruction sheet, as given.   [facility] [place] [id number] [place] [address] Dept: [phone] Loc: [phone]  Sean Barnett  08/29/2019  You are scheduled for a Cardiac Catheterization on Wednesday, October 7 with Dr. [name].  1. Please arrive at the [facility] (Main Entrance A) at [facility]: [address] [place], [place] [address] at 8:00 AM (This time is two hours before your procedure to ensure your preparation). Free valet parking service is available.   Special note: Every effort is made to have your procedure done on time. Please understand that emergencies sometimes delay scheduled procedures.  2. Diet: Do not eat solid foods after midnight.  The patient may have clear liquids until 5am upon the day of the procedure.  3. YOU ARE REQUIRED TO HAVE A PRE PROCEDURE COVID-19 TEST ON Friday October 2ND AT 2:10PM. AFTER TESTING YOU MUST SELF QUARANTINE  UNTIL YOUR CATH PROCEDURE.  4. Medication instructions in preparation for your procedure:   Contrast Allergy: No  Stop taking Eliquis (Apixiban) on Monday, October 5.  HOLD Lasix on the morning of the test.   On the morning of your procedure, take your morning medicines NOT listed above.  You may use sips of water.  5. Plan for one night stay--bring personal belongings. 6. Bring a current list of your medications and current insurance cards. 7. You MUST have a responsible person to drive you home. 8. Someone MUST be with you the first 24 hours after you arrive home or your discharge will be delayed. 9. Please wear clothes that are easy to get on and off and wear slip-on shoes.  Thank you for allowing [place] to care for you!   -- [facility]   At the [facility], you and your health needs are our priority. As part of our continuing mission to provide you with exceptional heart care, we have created designated Provider Care Teams. These Care Teams include your primary Cardiologist (physician) and Advanced Practice Providers (APPs- Physician Assistants and Nurse Practitioners) who all work together to provide you with the care you need, when you need it.   You may see any of the following providers on your designated Care Team at your next follow up: Marland Kitchen Dr Glori Bickers . Dr [name] . Darrick Grinder, NP   Please be sure to bring in all your medications bottles to every appointment.

## 2019-08-29 NOTE — Progress Notes (Signed)
PCP: Mosetta Anis, MD Cardiology: Dr. Aundra Dubin  68 y.o. with history of HTN and smoking was recently diagnosed with atrial fibrillation and chronic systolic CHF.  Patient was admitted in 4/20 with left elbow septic bursitis.  Several weeks prior to the admission, he had been feeling palpitations and had been tiring more easily.  At the time of admission, he reported chest pressure.  He was found to be in atrial fibrillation with RVR.  Echo was done, showing EF depressed to 30-35%.  He was started on Coreg and it was titrated up to control his HR.  He was started on Eliquis.  He was not cardioverted.   He has felt weak since 4/20. 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  He is short of breath walking up hills and stairs.  Occasional atypical chest pain.  No lightheadedness.  Still feels occasional palpitations.  He is still in atrial fibrillation though HR is now controlled.  He is still smoking but is trying to cut back.    ECG (personally reviewed): atrial fibrillation, LAFB, iRBBB  Labs (5/20): LDL 65 Labs (8/20): K 4.5, creatinine 1.1, hgb 13.1  PMH: 1. HTN 2. Atrial fibrillation: Persistent.  3. Chronic systolic CHF: echo (7/82) with EF 30-35%, mildly decreased RV systolic function.  4. H/o septic bursitis  SH: Works as Games developer, widowed, smokes 1 ppd. Lives in [place].   FH: Father with lung cancer.  Brother with CABG in his 35s.   ROS: All systems reviewed and negative except as per HPI.   Current Outpatient Medications  Medication Sig Dispense Refill  . apixaban (ELIQUIS) 5 MG TABS tablet Take 1 tablet (5 mg total) by mouth 2 (two) times daily. 60 tablet 3  . carvedilol (COREG) 25 MG tablet Take 1 tablet (25 mg total) by mouth 2 (two) times daily with a meal. 90 tablet 3  . furosemide (LASIX) 20 MG tablet Take 1 tablet (20 mg total) by mouth daily. 30 tablet 11  . sacubitril-valsartan (ENTRESTO) 24-26 MG Take 1 tablet by mouth 2 (two) times daily. 60 tablet 5    No current facility-administered medications for this encounter.    BP (!) 148/70   Pulse 84   Wt 105.6 kg (232 lb 12.8 oz)   SpO2 97%   BMI 28.34 kg/m  General: NAD Neck: No JVD, no thyromegaly or thyroid nodule.  Lungs: Clear to auscultation bilaterally with normal respiratory effort. CV: Nondisplaced PMI.  Heart irregular S1/S2, no S3/S4, no murmur.  No peripheral edema.  No carotid bruit.  Normal pedal pulses.  Abdomen: Soft, nontender, no hepatosplenomegaly, no distention.  Skin: Intact without lesions or rashes.  Neurologic: Alert and oriented x 3.  Psych: Normal affect. Extremities: No clubbing or cyanosis.  HEENT: Normal.   Assessment/Plan: 1. Chronic systolic CHF: Echo in 9/56 with EF 30-35%, mildly decreased RV systolic function.  Cause of cardiomyopathy is uncertain.  It is possible that this is a tachycardia-mediated cardiomyopathy with EF down because of atrial fibrillation.  It is possible that the cardiomyopathy is from some other cause and that the cardiomyopathy itself predisposed him to atrial fibrillation.  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  He remains in atrial fibrillation today.  He is not volume overloaded on exam.  Probably NYHA class II symptoms.  - Continue Coreg 25 mg bid, he is at goal dose.  - Stop lisinopril, after 36 hrs  start Entresto 24/26 bid. BMET/BNP today and BMET again in 10 days.  - Next step will be to add spironolactone.  - He will need left/right heart cath to rule out CAD as cause of cardiomyopathy => will hold Eliquis x 2 days then do procedure.  I discussed risks/benefits with patient and he agrees.  - After cath, he will restart Eliquis and after 3 weeks, I will arrange for DCCV.  We need to get him back into NSR.  2. Atrial fibrillation: Persistent.  Rate is now controlled.  Given concern for tachycardia-mediated CMP, we will plan to get him back into NSR as  above.  - Continue Eliquis 5 mg bid.  - Continue Coreg for rate control.  - DCCV after cath.  - Eventually would consider atrial fibrillation ablation ([facility]-HF data).  3. Smoking: I strongly recommended that he quit.  He does not want to use Chantix.  I recommended that he try nicotine patches.  4. HTN: BP still elevated, plan to switch from lisinopril to [facility].   Followup after cath.   Marca Ancona 08/29/2019

## 2019-08-29 NOTE — H&P (View-Only) (Signed)
PCP: Mosetta Anis, MD Cardiology: Dr. Aundra Dubin  68 y.o. with history of HTN and smoking was recently diagnosed with atrial fibrillation and chronic systolic CHF.  Patient was admitted in 4/20 with left elbow septic bursitis.  Several weeks prior to the admission, he had been feeling palpitations and had been tiring more easily.  At the time of admission, he reported chest pressure.  He was found to be in atrial fibrillation with RVR.  Echo was done, showing EF depressed to 30-35%.  He was started on Coreg and it was titrated up to control his HR.  He was started on Eliquis.  He was not cardioverted.   He has felt weak since 4/20. 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  He is short of breath walking up hills and stairs.  Occasional atypical chest pain.  No lightheadedness.  Still feels occasional palpitations.  He is still in atrial fibrillation though HR is now controlled.  He is still smoking but is trying to cut back.    ECG (personally reviewed): atrial fibrillation, LAFB, iRBBB  Labs (5/20): LDL 65 Labs (8/20): K 4.5, creatinine 1.1, hgb 13.1  PMH: 1. HTN 2. Atrial fibrillation: Persistent.  3. Chronic systolic CHF: echo (7/82) with EF 30-35%, mildly decreased RV systolic function.  4. H/o septic bursitis  SH: Works as Games developer, widowed, smokes 1 ppd. Lives in [place].   FH: Father with lung cancer.  Brother with CABG in his 35s.   ROS: All systems reviewed and negative except as per HPI.   Current Outpatient Medications  Medication Sig Dispense Refill  . apixaban (ELIQUIS) 5 MG TABS tablet Take 1 tablet (5 mg total) by mouth 2 (two) times daily. 60 tablet 3  . carvedilol (COREG) 25 MG tablet Take 1 tablet (25 mg total) by mouth 2 (two) times daily with a meal. 90 tablet 3  . furosemide (LASIX) 20 MG tablet Take 1 tablet (20 mg total) by mouth daily. 30 tablet 11  . sacubitril-valsartan (ENTRESTO) 24-26 MG Take 1 tablet by mouth 2 (two) times daily. 60 tablet 5    No current facility-administered medications for this encounter.    BP (!) 148/70   Pulse 84   Wt 105.6 kg (232 lb 12.8 oz)   SpO2 97%   BMI 28.34 kg/m  General: NAD Neck: No JVD, no thyromegaly or thyroid nodule.  Lungs: Clear to auscultation bilaterally with normal respiratory effort. CV: Nondisplaced PMI.  Heart irregular S1/S2, no S3/S4, no murmur.  No peripheral edema.  No carotid bruit.  Normal pedal pulses.  Abdomen: Soft, nontender, no hepatosplenomegaly, no distention.  Skin: Intact without lesions or rashes.  Neurologic: Alert and oriented x 3.  Psych: Normal affect. Extremities: No clubbing or cyanosis.  HEENT: Normal.   Assessment/Plan: 1. Chronic systolic CHF: Echo in 9/56 with EF 30-35%, mildly decreased RV systolic function.  Cause of cardiomyopathy is uncertain.  It is possible that this is a tachycardia-mediated cardiomyopathy with EF down because of atrial fibrillation.  It is possible that the cardiomyopathy is from some other cause and that the cardiomyopathy itself predisposed him to atrial fibrillation.  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  He remains in atrial fibrillation today.  He is not volume overloaded on exam.  Probably NYHA class II symptoms.  - Continue Coreg 25 mg bid, he is at goal dose.  - Stop lisinopril, after 36 hrs  start Entresto 24/26 bid. BMET/BNP today and BMET again in 10 days.  - Next step will be to add spironolactone.  - He will need left/right heart cath to rule out CAD as cause of cardiomyopathy => will hold Eliquis x 2 days then do procedure.  I discussed risks/benefits with patient and he agrees.  - After cath, he will restart Eliquis and after 3 weeks, I will arrange for DCCV.  We need to get him back into NSR.  2. Atrial fibrillation: Persistent.  Rate is now controlled.  Given concern for tachycardia-mediated CMP, we will plan to get him back into NSR as  above.  - Continue Eliquis 5 mg bid.  - Continue Coreg for rate control.  - DCCV after cath.  - Eventually would consider atrial fibrillation ablation ([facility]-HF data).  3. Smoking: I strongly recommended that he quit.  He does not want to use Chantix.  I recommended that he try nicotine patches.  4. HTN: BP still elevated, plan to switch from lisinopril to Entresto.   Followup after cath.   Dalton McLean 08/29/2019   

## 2019-08-30 ENCOUNTER — Other Ambulatory Visit (HOSPITAL_COMMUNITY): Payer: Self-pay | Admitting: *Deleted

## 2019-08-30 DIAGNOSIS — I5022 Chronic systolic (congestive) heart failure: Secondary | ICD-10-CM

## 2019-09-01 ENCOUNTER — Other Ambulatory Visit (HOSPITAL_COMMUNITY)
Admission: RE | Admit: 2019-09-01 | Discharge: 2019-09-01 | Disposition: A | Discharge status: Home / Self Care | Payer: Medicaid Other | Source: Ambulatory Visit | Attending: Cardiology | Admitting: Cardiology

## 2019-09-01 DIAGNOSIS — Z01812 Encounter for preprocedural laboratory examination: Secondary | ICD-10-CM | POA: Diagnosis not present

## 2019-09-01 DIAGNOSIS — Z20828 Contact with and (suspected) exposure to other viral communicable diseases: Secondary | ICD-10-CM | POA: Insufficient documentation

## 2019-09-03 LAB — NOVEL CORONAVIRUS, NAA (HOSP ORDER, SEND-OUT TO REF LAB; TAT 18-24 HRS): SARS-CoV-2, NAA: NOT DETECTED

## 2019-09-06 ENCOUNTER — Other Ambulatory Visit (HOSPITAL_COMMUNITY): Payer: Self-pay

## 2019-09-06 ENCOUNTER — Other Ambulatory Visit: Payer: Self-pay

## 2019-09-06 ENCOUNTER — Observation Stay (HOSPITAL_COMMUNITY)
Admission: RE | Admit: 2019-09-06 | Discharge: 2019-09-06 | Disposition: A | Discharge status: Home / Self Care | Payer: Medicaid Other | Attending: Cardiology | Admitting: Cardiology

## 2019-09-06 ENCOUNTER — Encounter (HOSPITAL_COMMUNITY)
Admission: RE | Disposition: A | Discharge status: Home / Self Care | Payer: Self-pay | Source: Home / Self Care | Attending: Cardiology

## 2019-09-06 DIAGNOSIS — I428 Other cardiomyopathies: Secondary | ICD-10-CM | POA: Diagnosis not present

## 2019-09-06 DIAGNOSIS — I110 Hypertensive heart disease with heart failure: Principal | ICD-10-CM | POA: Insufficient documentation

## 2019-09-06 DIAGNOSIS — Z7901 Long term (current) use of anticoagulants: Secondary | ICD-10-CM | POA: Diagnosis not present

## 2019-09-06 DIAGNOSIS — I509 Heart failure, unspecified: Secondary | ICD-10-CM

## 2019-09-06 DIAGNOSIS — I5022 Chronic systolic (congestive) heart failure: Secondary | ICD-10-CM | POA: Diagnosis present

## 2019-09-06 DIAGNOSIS — Z79899 Other long term (current) drug therapy: Secondary | ICD-10-CM | POA: Insufficient documentation

## 2019-09-06 DIAGNOSIS — I4819 Other persistent atrial fibrillation: Secondary | ICD-10-CM | POA: Insufficient documentation

## 2019-09-06 DIAGNOSIS — F17210 Nicotine dependence, cigarettes, uncomplicated: Secondary | ICD-10-CM | POA: Insufficient documentation

## 2019-09-06 DIAGNOSIS — I5023 Acute on chronic systolic (congestive) heart failure: Secondary | ICD-10-CM | POA: Diagnosis present

## 2019-09-06 DIAGNOSIS — I2510 Atherosclerotic heart disease of native coronary artery without angina pectoris: Secondary | ICD-10-CM | POA: Insufficient documentation

## 2019-09-06 HISTORY — PX: RIGHT/LEFT HEART CATH AND CORONARY ANGIOGRAPHY: CATH118266

## 2019-09-06 LAB — POCT I-STAT EG7
Bicarbonate: 26.2 mmol/L (ref 20.0–28.0)
Calcium, Ion: 1.19 mmol/L (ref 1.15–1.40)
HCT: 40 % (ref 39.0–52.0)
Hemoglobin: 13.6 g/dL (ref 13.0–17.0)
O2 Saturation: 62 %
Potassium: 3.9 mmol/L (ref 3.5–5.1)
Sodium: 142 mmol/L (ref 135–145)
TCO2: 28 mmol/L (ref 22–32)
pCO2, Ven: 48.2 mmHg (ref 44.0–60.0)
pH, Ven: 7.344 (ref 7.250–7.430)
pO2, Ven: 34 mmHg (ref 32.0–45.0)

## 2019-09-06 SURGERY — RIGHT/LEFT HEART CATH AND CORONARY ANGIOGRAPHY
Anesthesia: LOCAL

## 2019-09-06 MED ORDER — SODIUM CHLORIDE 0.9% FLUSH: 3.0000 mL | Freq: Two times a day (BID) | INTRAVENOUS | Status: DC

## 2019-09-06 MED ORDER — DIGOXIN 125 MCG PO TABS: 0.1250 mg | ORAL_TABLET | Freq: Every day | ORAL | Status: DC

## 2019-09-06 MED ORDER — MIDAZOLAM HCL 2 MG/2ML IJ SOLN
INTRAMUSCULAR | Status: DC | PRN
  Administered 2019-09-06: 1 mg via INTRAVENOUS

## 2019-09-06 MED ORDER — APIXABAN 5 MG PO TABS: 5.0000 mg | ORAL_TABLET | Freq: Two times a day (BID) | ORAL | Status: DC

## 2019-09-06 MED ORDER — VERAPAMIL HCL 2.5 MG/ML IV SOLN
INTRAVENOUS | Status: AC
  Filled 2019-09-06: qty 2

## 2019-09-06 MED ORDER — HEPARIN SODIUM (PORCINE) 1000 UNIT/ML IJ SOLN
INTRAMUSCULAR | Status: AC
  Filled 2019-09-06: qty 1

## 2019-09-06 MED ORDER — HEPARIN (PORCINE) IN NACL 1000-0.9 UT/500ML-% IV SOLN
INTRAVENOUS | Status: DC | PRN
  Administered 2019-09-06 (×2): 500 mL

## 2019-09-06 MED ORDER — DIGOXIN 125 MCG PO TABS: 125.0000 ug | ORAL_TABLET | Freq: Every day | ORAL | 11 refills | Status: DC

## 2019-09-06 MED ORDER — MIDAZOLAM HCL 2 MG/2ML IJ SOLN
INTRAMUSCULAR | Status: AC
  Filled 2019-09-06: qty 2

## 2019-09-06 MED ORDER — LABETALOL HCL 5 MG/ML IV SOLN
10.0000 mg | INTRAVENOUS | Status: DC | PRN
  Administered 2019-09-06: 10 mg via INTRAVENOUS

## 2019-09-06 MED ORDER — FENTANYL CITRATE (PF) 100 MCG/2ML IJ SOLN
INTRAMUSCULAR | Status: AC
  Filled 2019-09-06: qty 2

## 2019-09-06 MED ORDER — LIDOCAINE HCL (PF) 1 % IJ SOLN
INTRAMUSCULAR | Status: DC | PRN
  Administered 2019-09-06 (×2): 2 mL

## 2019-09-06 MED ORDER — FENTANYL CITRATE (PF) 100 MCG/2ML IJ SOLN
INTRAMUSCULAR | Status: DC | PRN
  Administered 2019-09-06: 25 ug via INTRAVENOUS

## 2019-09-06 MED ORDER — SODIUM CHLORIDE 0.9 % IV SOLN: INTRAVENOUS | Status: DC

## 2019-09-06 MED ORDER — ASPIRIN 81 MG PO CHEW
81.0000 mg | CHEWABLE_TABLET | ORAL | Status: AC
  Administered 2019-09-06: 09:00:00 81 mg via ORAL

## 2019-09-06 MED ORDER — ASPIRIN 81 MG PO CHEW
CHEWABLE_TABLET | ORAL | Status: AC
  Administered 2019-09-06: 81 mg via ORAL
  Filled 2019-09-06: qty 1

## 2019-09-06 MED ORDER — SODIUM CHLORIDE 0.9 % IV SOLN: 250.0000 mL | INTRAVENOUS | Status: DC | PRN

## 2019-09-06 MED ORDER — LABETALOL HCL 5 MG/ML IV SOLN
INTRAVENOUS | Status: AC
  Filled 2019-09-06: qty 4

## 2019-09-06 MED ORDER — ONDANSETRON HCL 4 MG/2ML IJ SOLN: 4.0000 mg | Freq: Four times a day (QID) | INTRAMUSCULAR | Status: DC | PRN

## 2019-09-06 MED ORDER — HEPARIN SODIUM (PORCINE) 1000 UNIT/ML IJ SOLN
INTRAMUSCULAR | Status: DC | PRN
  Administered 2019-09-06: 5000 [IU] via INTRAVENOUS

## 2019-09-06 MED ORDER — SODIUM CHLORIDE 0.9% FLUSH: 3.0000 mL | INTRAVENOUS | Status: DC | PRN

## 2019-09-06 MED ORDER — HYDRALAZINE HCL 20 MG/ML IJ SOLN: 10.0000 mg | INTRAMUSCULAR | Status: DC | PRN

## 2019-09-06 MED ORDER — VERAPAMIL HCL 2.5 MG/ML IV SOLN
INTRAVENOUS | Status: DC | PRN
  Administered 2019-09-06: 13:00:00 10 mL via INTRA_ARTERIAL

## 2019-09-06 MED ORDER — ACETAMINOPHEN 325 MG PO TABS: 650.0000 mg | ORAL_TABLET | ORAL | Status: DC | PRN

## 2019-09-06 MED ORDER — LIDOCAINE HCL (PF) 1 % IJ SOLN
INTRAMUSCULAR | Status: AC
  Filled 2019-09-06: qty 30

## 2019-09-06 MED ORDER — SODIUM CHLORIDE 0.9 % IV SOLN
INTRAVENOUS | Status: DC
  Administered 2019-09-06: 09:00:00 via INTRAVENOUS

## 2019-09-06 MED ORDER — HEPARIN (PORCINE) IN NACL 1000-0.9 UT/500ML-% IV SOLN
INTRAVENOUS | Status: AC
  Filled 2019-09-06: qty 1000

## 2019-09-06 MED ORDER — IOHEXOL 350 MG/ML SOLN
INTRAVENOUS | Status: DC | PRN
  Administered 2019-09-06: 13:00:00 80 mL

## 2019-09-06 SURGICAL SUPPLY — 10 items
CATH 5FR JL3.5 JR4 ANG PIG MP (CATHETERS) ×2 IMPLANT
CATH BALLN WEDGE 5F 110CM (CATHETERS) ×2 IMPLANT
DEVICE RAD COMP TR BAND LRG (VASCULAR PRODUCTS) ×2 IMPLANT
GLIDESHEATH SLEND SS 6F .021 (SHEATH) ×2 IMPLANT
GUIDEWIRE .025 260CM (WIRE) ×2 IMPLANT
GUIDEWIRE INQWIRE 1.5J.035X260 (WIRE) ×1 IMPLANT
INQWIRE 1.5J .035X260CM (WIRE) ×2
PACK CARDIAC CATHETERIZATION (CUSTOM PROCEDURE TRAY) ×2 IMPLANT
SHEATH GLIDE SLENDER 4/5FR (SHEATH) ×2 IMPLANT
TRANSDUCER W/STOPCOCK (MISCELLANEOUS) ×2 IMPLANT

## 2019-09-06 NOTE — Discharge Instructions (Signed)
Digoxin tablets or capsules What is this medicine? DIGOXIN (di JOX in) is used to treat congestive heart failure and heart rhythm problems. This medicine may be used for other purposes; ask your health care provider or pharmacist if you have questions. COMMON BRAND NAME(S): Digitek, Lanoxicaps, Lanoxin What should I tell my health care provider before I take this medicine? They need to know if you have any of these conditions:  certain heart rhythm disorders  heart disease or recent heart attack  kidney or liver disease  an unusual or allergic reaction to digoxin, other medicines, foods, dyes, or preservatives  pregnant or trying to get pregnant  breast-feeding How should I use this medicine? Take this medicine by mouth with a glass of water. Follow the directions on the prescription label. Take your doses at regular intervals. Do not take your medicine more often than directed. Talk to your pediatrician regarding the use of this medicine in children. Special care may be needed. Overdosage: If you think you have taken too much of this medicine contact a poison control center or emergency room at once. NOTE: This medicine is only for you. Do not share this medicine with others. What if I miss a dose? If you miss a dose, take it as soon as you can. If it is almost time for your next dose, take only that dose. Do not take double or extra doses. What may interact with this medicine?  activated charcoal  albuterol  alprazolam  antacids  antiviral medicines for HIV or AIDS like ritonavir and saquinavir  calcium  certain antibiotics like azithromycin, clarithromycin, erythromycin, gentamicin, neomycin, trimethoprim, and tetracycline  certain medicines for blood pressure, heart disease, irregular heart beat  certain medicines for cancer  certain medicines for cholesterol like atorvastatin, cholestyramine, and colestipol  certain medicines for diabetes, like acarbose,  exenatide, miglitol, and metformin  certain medicines for fungal infections like ketoconazole and itraconazole  certain medicines for stomach problems like omeprazole, esomeprazole, lansoprazole, rabeprazole, metoclopramide, and sucralfate  conivaptan  cyclosporine  diphenoxylate  epinephrine  kaolin; pectin  nefazodone  NSAIDS, medicines for pain and inflammation, like celecoxib, ibuprofen, or naproxen  penicillamine  phenytoin  propantheline  quinine  phenytoin  rifampin  succinylcholine  St. John's Wort  sulfasalazine  teriparatide  thyroid hormones  tolvaptan This list may not describe all possible interactions. Give your health care provider a list of all the medicines, herbs, non-prescription drugs, or dietary supplements you use. Also tell them if you smoke, drink alcohol, or use illegal drugs. Some items may interact with your medicine. What should I watch for while using this medicine? Visit your doctor or health care professional for regular checks on your progress. Do not stop taking this medicine without the advice of your doctor or health care professional, even if you feel better. Do not change the brand you are taking, other brands may affect you differently. Check your heart rate and blood pressure regularly while you are taking this medicine. Ask your doctor or health care professional what your heart rate and blood pressure should be, and when you should contact him or her. Your doctor or health care professional also may schedule regular blood tests and electrocardiograms to check your progress. Watch your diet. Less digoxin may be absorbed from the stomach if you have a diet high in bran fiber. Do not treat yourself for coughs, colds or allergies without asking your doctor or health care professional for advice. Some ingredients can increase possible side effects.  What side effects may I notice from receiving this medicine? Side effects that you  should report to your doctor or health care professional as soon as possible:  allergic reactions like skin rash, itching or hives, swelling of the face, lips, or tongue  changes in behavior, mood, or mental ability  changes in vision  confusion  fast, irregular heartbeat  feeling faint or lightheaded, falls  headache  nausea, vomiting  unusual bleeding, bruising  unusually weak or tired Side effects that usually do not require medical attention (report to your doctor or health care professional if they continue or are bothersome):  breast enlargement in men and women  diarrhea This list may not describe all possible side effects. Call your doctor for medical advice about side effects. You may report side effects to FDA at 1-800-FDA-1088. Where should I keep my medicine? Keep out of the reach of children. Store at room temperature between 15 and 30 degrees C (59 and 86 degrees F). Protect from light and moisture. Throw away any unused medicine after the expiration date. NOTE: This sheet is a summary. It may not cover all possible information. If you have questions about this medicine, talk to your doctor, pharmacist, or health care provider.  2020 Elsevier/Gold Standard (2016-11-04 15:40:26) Radial Site Care  This sheet gives you information about how to care for yourself after your procedure. Your health care provider may also give you more specific instructions. If you have problems or questions, contact your health care provider. What can I expect after the procedure? After the procedure, it is common to have:  Bruising and tenderness at the catheter insertion area. Follow these instructions at home: Medicines  Take over-the-counter and prescription medicines only as told by your health care provider. Insertion site care  Follow instructions from your health care provider about how to take care of your insertion site. Make sure you: ? Wash your hands with soap and  water before you change your bandage (dressing). If soap and water are not available, use hand sanitizer. ? Change your dressing as told by your health care provider. ? Leave stitches (sutures), skin glue, or adhesive strips in place. These skin closures may need to stay in place for 2 weeks or longer. If adhesive strip edges start to loosen and curl up, you may trim the loose edges. Do not remove adhesive strips completely unless your health care provider tells you to do that.  Check your insertion site every day for signs of infection. Check for: ? Redness, swelling, or pain. ? Fluid or blood. ? Pus or a bad smell. ? Warmth.  Do not take baths, swim, or use a hot tub until your health care provider approves.  You may shower 24-48 hours after the procedure, or as directed by your health care provider. ? Remove the dressing and gently wash the site with plain soap and water. ? Pat the area dry with a clean towel. ? Do not rub the site. That could cause bleeding.  Do not apply powder or lotion to the site. Activity   For 24 hours after the procedure, or as directed by your health care provider: ? Do not flex or bend the affected arm. ? Do not push or pull heavy objects with the affected arm. ? Do not drive yourself home from the hospital or clinic. You may drive 24 hours after the procedure unless your health care provider tells you not to. ? Do not operate machinery or power tools.  Do not lift anything that is heavier than 10 lb (4.5 kg), or the limit that you are told, until your health care provider says that it is safe.  Ask your health care provider when it is okay to: ? Return to work or school. ? Resume usual physical activities or sports. ? Resume sexual activity. General instructions  If the catheter site starts to bleed, raise your arm and put firm pressure on the site. If the bleeding does not stop, get help right away. This is a medical emergency.  If you went home on  the same day as your procedure, a responsible adult should be with you for the first 24 hours after you arrive home.  Keep all follow-up visits as told by your health care provider. This is important. Contact a health care provider if:  You have a fever.  You have redness, swelling, or yellow drainage around your insertion site. Get help right away if:  You have unusual pain at the radial site.  The catheter insertion area swells very fast.  The insertion area is bleeding, and the bleeding does not stop when you hold steady pressure on the area.  Your arm or hand becomes pale, cool, tingly, or numb. These symptoms may represent a serious problem that is an emergency. Do not wait to see if the symptoms will go away. Get medical help right away. Call your local emergency services (911 in the [place] Do not drive yourself to the hospital. Summary  After the procedure, it is common to have bruising and tenderness at the site.  Follow instructions from your health care provider about how to take care of your radial site wound. Check the wound every day for signs of infection.  Do not lift anything that is heavier than 10 lb (4.5 kg), or the limit that you are told, until your health care provider says that it is safe. This information is not intended to replace advice given to you by your health care provider. Make sure you discuss any questions you have with your health care provider. Document Released: 12/19/2010 Document Revised: 12/22/2017 Document Reviewed: 12/22/2017 Elsevier Patient Education  2020 [employer].

## 2019-09-06 NOTE — Progress Notes (Addendum)
Pt friend Sean Barnett called [place] and states that she can not take him home with her. Dr Aundra Dubin called and informed. Pt informed that Sean Barnett will not be able to stay with him. Sean Barnett states he will try to call his daughter. No answer when he called her. Sean Barnett states he is not going to stay in the hospital. Instructed that he will need to sign out AMA. He then pushed up with his right arm. Instructed Sean Barnett not to use that arm. Dr Aundra Dubin called and informed that he is refusing to spend the night.

## 2019-09-06 NOTE — Interval H&P Note (Signed)
History and Physical Interval Note:  09/06/2019 12:44 PM  Sean Barnett  has presented today for surgery, with the diagnosis of chf.  The various methods of treatment have been discussed with the patient and family. After consideration of risks, benefits and other options for treatment, the patient has consented to  Procedure(s): RIGHT/LEFT HEART CATH AND CORONARY ANGIOGRAPHY (N/A) as a surgical intervention.  The patient's history has been reviewed, patient examined, no change in status, stable for surgery.  I have reviewed the patient's chart and labs.  Questions were answered to the patient's satisfaction.     Normand Damron [employer]

## 2019-09-06 NOTE — Progress Notes (Signed)
Medication Samples have been provided to the patient.  Drug name: Delene Loll       Strength: 24/26        Qty: 56  LOT: [id number]  Exp.Date: 3/22  Dosing instructions: 1 tab twice a day  The patient has been instructed regarding the correct time, dose, and frequency of taking this medication, including desired effects and most common side effects.   Linus Orn Ileane Sando 3:08 PM 09/06/2019

## 2019-09-06 NOTE — Progress Notes (Signed)
Discharge instructions reviewed with Pt and his friend Diane. Both voice understanding.

## 2019-09-07 ENCOUNTER — Telehealth (HOSPITAL_COMMUNITY): Payer: Self-pay | Admitting: Pharmacy Technician

## 2019-09-07 ENCOUNTER — Encounter (HOSPITAL_COMMUNITY): Payer: Self-pay | Admitting: Cardiology

## 2019-09-07 LAB — POCT I-STAT EG7
Bicarbonate: 26 mmol/L (ref 20.0–28.0)
Calcium, Ion: 1.25 mmol/L (ref 1.15–1.40)
HCT: 41 % (ref 39.0–52.0)
Hemoglobin: 13.9 g/dL (ref 13.0–17.0)
O2 Saturation: 62 %
Potassium: 4.1 mmol/L (ref 3.5–5.1)
Sodium: 141 mmol/L (ref 135–145)
TCO2: 27 mmol/L (ref 22–32)
pCO2, Ven: 47.9 mmHg (ref 44.0–60.0)
pH, Ven: 7.342 (ref 7.250–7.430)
pO2, Ven: 35 mmHg (ref 32.0–45.0)

## 2019-09-07 NOTE — Discharge Summary (Signed)
Advanced Heart Failure Team  Discharge Summary   Patient ID: Sean Barnett MRN: [id number], DOB/AGE: 1951/03/11 68 y.o. Admit date: 09/06/2019 D/C date:     09/06/19    Primary Discharge Diagnoses:  1. Chronic Systolic Heart Failure  2. Chronic A fib  3. Smoker 4. HTN   Hospital Course:  68 y.o. with history of HTN and smoking was recently diagnosed with atrial fibrillation and chronic systolic CHF.  Patient was admitted in 4/20 with left elbow septic bursitis.  Several weeks prior to the admission, he had been feeling palpitations and had been tiring more easily.  At the time of admission, he reported chest pressure.  He was found to be in atrial fibrillation with RVR.  Echo was done, showing EF depressed to 30-35%.  He was started on Coreg and it was titrated up to control his HR.  He was started on Eliquis.  He was not cardioverted.   He presented on 09/06/19 with diagnostic catherization. Cath showed mildly elevated PCWP and normal RA, low cardiac output, and extensive CAD. Started on digoxin. Plan to set up for cardioversion. He was discharged the same day with follow up to HF clinic.   RHC/LHC 09/06/19  Left Main  No significant disease.  Left Anterior Descending  Calcified 40% proximal LAD stenosis. Large D1, 30% proximal stenosis.  Left Circumflex  60% stenosis mid LCx after PLOM. 40% stenosis proximal PLOM.  Right Coronary Artery  30% stenosis proximal RCA, 50% stenosis distal RCA at bifurcation of PLV and PDA.  Intervention  No interventions have been documented. Right Heart  Right Heart Pressures RHC Procedural Findings: Hemodynamics (mmHg) RA mean 2 RV 41/5 PA 45/20, mean 32 PCWP mean 22 LV 154/16 AO 142/90  Oxygen saturations: PA 62% AO 100%  Cardiac Output (Fick) 4.35  Cardiac Index (Fick) 1.85 PVR 2.3 WU     Discharge Vitals: Blood pressure (!) 162/89, pulse 80, temperature (!) 97.2 F (36.2 C), temperature source Skin, resp. rate (!) 0, height 6\' 4"   (1.93 m), weight 103.9 kg, SpO2 100 %.  Labs: Lab Results  Component Value Date   WBC 8.4 08/29/2019   HGB 13.6 09/06/2019   HCT 40.0 09/06/2019   MCV 89.8 08/29/2019   PLT 265 08/29/2019    Recent Labs  Lab 09/06/19 1258  NA 142  K 3.9   Lab Results  Component Value Date   CHOL 101 03/31/2019   HDL 24 (L) 03/31/2019   LDLCALC 65 03/31/2019   TRIG 61 03/31/2019   BNP (last 3 results) Recent Labs    03/24/19 1012 08/29/19 1009  BNP 756.6* 737.2*    ProBNP (last 3 results) No results for input(s): PROBNP in the last 8760 hours.   Diagnostic Studies/Procedures   No results found.  Discharge Medications   Allergies as of 09/06/2019      Reactions   Sulfa Antibiotics Swelling   "like a balloon"      Medication List    STOP taking these medications   ibuprofen 200 MG tablet Commonly known as: ADVIL     TAKE these medications   apixaban 5 MG Tabs tablet Commonly known as: Eliquis Take 1 tablet (5 mg total) by mouth 2 (two) times daily. Notes to patient: Restart med today one dose, full  doses tomorrow    carvedilol 25 MG tablet Commonly known as: COREG Take 1 tablet (25 mg total) by mouth 2 (two) times daily with a meal.   digoxin 0.125 MG tablet Commonly  known as: Lanoxin Take 1 tablet (125 mcg total) by mouth daily.   Entresto 24-26 MG Generic drug: sacubitril-valsartan Take 1 tablet by mouth 2 (two) times daily.   furosemide 20 MG tablet Commonly known as: Lasix Take 1 tablet (20 mg total) by mouth daily.   lisinopril 10 MG tablet Commonly known as: ZESTRIL Take 20 mg by mouth daily.     ASK your doctor about these medications   aspirin 81 MG chewable tablet Chew 81 mg by mouth once. Ask about: Should I take this medication?       Disposition   The patient will be discharged in stable condition to home.      Duration of Discharge Encounter: Greater than 35 minutes   Signed,   NP-C  09/07/2019, 2:39 PM

## 2019-09-07 NOTE — Telephone Encounter (Signed)
Advanced Heart Failure Patient Advocate Encounter  Prior Authorization for Delene Loll has been approved.    PA# [id number] Effective dates: 09/07/2019 through 09/06/2020  Patients co-pay is $3.00  Called [facility] and they are getting the medication ready for him. Called patient to make him aware of approval, he was already headed over to the pharmacy.  Charlann Boxer, CPhT

## 2019-09-25 ENCOUNTER — Other Ambulatory Visit (HOSPITAL_COMMUNITY): Payer: Self-pay

## 2019-09-25 ENCOUNTER — Other Ambulatory Visit: Payer: Self-pay

## 2019-09-25 ENCOUNTER — Other Ambulatory Visit (HOSPITAL_COMMUNITY)
Admit: 2019-09-25 | Discharge: 2019-09-25 | Disposition: A | Discharge status: Home / Self Care | Payer: Medicaid Other | Attending: Cardiology | Admitting: Cardiology

## 2019-09-25 ENCOUNTER — Telehealth (HOSPITAL_COMMUNITY): Payer: Self-pay

## 2019-09-25 ENCOUNTER — Ambulatory Visit (HOSPITAL_COMMUNITY)
Admit: 2019-09-25 | Discharge: 2019-09-25 | Disposition: A | Discharge status: Home / Self Care | Payer: Medicaid Other | Source: Ambulatory Visit | Attending: Cardiology | Admitting: Cardiology

## 2019-09-25 DIAGNOSIS — Z20828 Contact with and (suspected) exposure to other viral communicable diseases: Secondary | ICD-10-CM | POA: Diagnosis not present

## 2019-09-25 DIAGNOSIS — I4891 Unspecified atrial fibrillation: Secondary | ICD-10-CM

## 2019-09-25 DIAGNOSIS — I5022 Chronic systolic (congestive) heart failure: Secondary | ICD-10-CM

## 2019-09-25 DIAGNOSIS — I5020 Unspecified systolic (congestive) heart failure: Secondary | ICD-10-CM

## 2019-09-25 DIAGNOSIS — Z01812 Encounter for preprocedural laboratory examination: Secondary | ICD-10-CM | POA: Insufficient documentation

## 2019-09-25 LAB — BASIC METABOLIC PANEL
Anion gap: 17 — ABNORMAL HIGH (ref 5–15)
BUN: 13 mg/dL (ref 8–23)
CO2: 14 mmol/L — ABNORMAL LOW (ref 22–32)
Calcium: 8.4 mg/dL — ABNORMAL LOW (ref 8.9–10.3)
Chloride: 103 mmol/L (ref 98–111)
Creatinine, Ser: 1.59 mg/dL — ABNORMAL HIGH (ref 0.61–1.24)
GFR calc Af Amer: 51 mL/min — ABNORMAL LOW (ref >60)
GFR calc non Af Amer: 44 mL/min — ABNORMAL LOW (ref >60)
Glucose, Bld: 369 mg/dL — ABNORMAL HIGH (ref 70–99)
Potassium: 4.1 mmol/L (ref 3.5–5.1)
Sodium: 134 mmol/L — ABNORMAL LOW (ref 135–145)

## 2019-09-25 LAB — CBC
HCT: 47.1 % (ref 39.0–52.0)
Hemoglobin: 15.2 g/dL (ref 13.0–17.0)
MCH: 29.3 pg (ref 26.0–34.0)
MCHC: 32.3 g/dL (ref 30.0–36.0)
MCV: 90.8 fL (ref 80.0–100.0)
Platelets: 280 10*3/uL (ref 150–400)
RBC: 5.19 MIL/uL (ref 4.22–5.81)
RDW: 16.4 % — ABNORMAL HIGH (ref 11.5–15.5)
WBC: 10.2 10*3/uL (ref 4.0–10.5)
nRBC: 0 % (ref 0.0–0.2)

## 2019-09-25 LAB — SARS CORONAVIRUS 2 (TAT 6-24 HRS): SARS Coronavirus 2: NEGATIVE

## 2019-09-25 LAB — PROTIME-INR
INR: 1.3 — ABNORMAL HIGH (ref 0.8–1.2)
Prothrombin Time: 15.6 seconds — ABNORMAL HIGH (ref 11.4–15.2)

## 2019-09-25 MED ORDER — FUROSEMIDE 20 MG PO TABS: 20.0000 mg | ORAL_TABLET | ORAL | 11 refills | Status: DC

## 2019-09-25 NOTE — Progress Notes (Signed)
c 

## 2019-09-25 NOTE — Telephone Encounter (Signed)
Pt aware of lab results and recommendations. Verbalized understanding. Will rtc in 1 week for lab work

## 2019-09-25 NOTE — Telephone Encounter (Signed)
-----   Message from Larey Dresser, MD sent at 09/25/2019  4:02 PM EDT ----- Hold Lasix for a day then decrease to Lasix 20 mg every other day. BMET 1 week.

## 2019-09-27 ENCOUNTER — Ambulatory Visit (HOSPITAL_COMMUNITY): Payer: Medicaid Other | Admitting: Certified Registered"

## 2019-09-27 ENCOUNTER — Encounter (HOSPITAL_COMMUNITY)
Admission: RE | Disposition: A | Discharge status: Home / Self Care | Payer: Self-pay | Source: Home / Self Care | Attending: Cardiology

## 2019-09-27 ENCOUNTER — Ambulatory Visit (HOSPITAL_COMMUNITY)
Admission: RE | Admit: 2019-09-27 | Discharge: 2019-09-27 | Disposition: A | Discharge status: Home / Self Care | Payer: Medicaid Other | Attending: Cardiology | Admitting: Cardiology

## 2019-09-27 ENCOUNTER — Other Ambulatory Visit: Payer: Self-pay

## 2019-09-27 ENCOUNTER — Encounter (HOSPITAL_COMMUNITY): Payer: Self-pay | Admitting: Anesthesiology

## 2019-09-27 DIAGNOSIS — Z8249 Family history of ischemic heart disease and other diseases of the circulatory system: Secondary | ICD-10-CM | POA: Insufficient documentation

## 2019-09-27 DIAGNOSIS — R001 Bradycardia, unspecified: Secondary | ICD-10-CM | POA: Diagnosis not present

## 2019-09-27 DIAGNOSIS — F17210 Nicotine dependence, cigarettes, uncomplicated: Secondary | ICD-10-CM | POA: Insufficient documentation

## 2019-09-27 DIAGNOSIS — I110 Hypertensive heart disease with heart failure: Secondary | ICD-10-CM | POA: Insufficient documentation

## 2019-09-27 DIAGNOSIS — I5020 Unspecified systolic (congestive) heart failure: Secondary | ICD-10-CM

## 2019-09-27 DIAGNOSIS — Z79899 Other long term (current) drug therapy: Secondary | ICD-10-CM | POA: Insufficient documentation

## 2019-09-27 DIAGNOSIS — I5023 Acute on chronic systolic (congestive) heart failure: Secondary | ICD-10-CM | POA: Diagnosis not present

## 2019-09-27 DIAGNOSIS — M1990 Unspecified osteoarthritis, unspecified site: Secondary | ICD-10-CM | POA: Diagnosis not present

## 2019-09-27 DIAGNOSIS — Z7901 Long term (current) use of anticoagulants: Secondary | ICD-10-CM | POA: Insufficient documentation

## 2019-09-27 DIAGNOSIS — I5022 Chronic systolic (congestive) heart failure: Secondary | ICD-10-CM | POA: Diagnosis not present

## 2019-09-27 DIAGNOSIS — I4891 Unspecified atrial fibrillation: Secondary | ICD-10-CM | POA: Insufficient documentation

## 2019-09-27 HISTORY — PX: CARDIOVERSION: SHX1299

## 2019-09-27 SURGERY — CARDIOVERSION
Anesthesia: General

## 2019-09-27 MED ORDER — PROPOFOL 10 MG/ML IV BOLUS
INTRAVENOUS | Status: DC | PRN
  Administered 2019-09-27: 30 mg via INTRAVENOUS
  Administered 2019-09-27: 50 mg via INTRAVENOUS

## 2019-09-27 MED ORDER — CARVEDILOL 25 MG PO TABS: 12.5000 mg | ORAL_TABLET | Freq: Two times a day (BID) | ORAL | 3 refills | Status: DC

## 2019-09-27 MED ORDER — LIDOCAINE 2% (20 MG/ML) 5 ML SYRINGE
INTRAMUSCULAR | Status: DC | PRN
  Administered 2019-09-27: 40 mg via INTRAVENOUS

## 2019-09-27 MED ORDER — APIXABAN 5 MG PO TABS
5.0000 mg | ORAL_TABLET | Freq: Once | ORAL | Status: AC
  Administered 2019-09-27: 5 mg via ORAL
  Filled 2019-09-27: qty 1

## 2019-09-27 MED ORDER — LACTATED RINGERS IV SOLN
INTRAVENOUS | Status: DC | PRN
  Administered 2019-09-27: 10:00:00 via INTRAVENOUS

## 2019-09-27 MED ORDER — LIDOCAINE 2% (20 MG/ML) 5 ML SYRINGE: INTRAMUSCULAR | Status: DC | PRN

## 2019-09-27 NOTE — Anesthesia Postprocedure Evaluation (Signed)
Anesthesia Post Note  Patient: Sean Barnett  Procedure(s) Performed: CARDIOVERSION (N/A )     Patient location during evaluation: PACU Anesthesia Type: General Level of consciousness: awake and alert and oriented Pain management: pain level controlled Vital Signs Assessment: post-procedure vital signs reviewed and stable Respiratory status: spontaneous breathing, nonlabored ventilation and respiratory function stable Cardiovascular status: blood pressure returned to baseline Postop Assessment: no apparent nausea or vomiting Anesthetic complications: no    Last Vitals:  Vitals:   09/27/19 1122 09/27/19 1126  BP: 138/80 130/77  Pulse: (!) 41 (!) 50  Resp: 10 14  Temp:    SpO2: 98% 100%    Last Pain:  Vitals:   09/27/19 1113  TempSrc:   PainSc: 0-No pain                 Brennan Bailey

## 2019-09-27 NOTE — Procedures (Signed)
Electrical Cardioversion Procedure Note Jon Lall [id number] 05/13/1951  Procedure: Electrical Cardioversion Indications:  Atrial Fibrillation  Procedure Details Consent: Risks of procedure as well as the alternatives and risks of each were explained to the (patient/caregiver).  Consent for procedure obtained. Time Out: Verified patient identification, verified procedure, site/side was marked, verified correct patient position, special equipment/implants available, medications/allergies/relevent history reviewed, required imaging and test results available.  Performed  Patient placed on cardiac monitor, pulse oximetry, supplemental oxygen as necessary.  Sedation given: Propofol per anesthesiology Pacer pads placed anterior and posterior chest.  Cardioverted 1 time(s).  Cardioverted at [place].  Evaluation Findings: Post procedure EKG shows: NSR Complications: None Patient did tolerate procedure well.  Sinus bradycardia in 40s with stable BP.  Will have him decrease Coreg to 12.5 mg bid.    [name] 09/27/2019, 11:20 AM

## 2019-09-27 NOTE — Transfer of Care (Signed)
Immediate Anesthesia Transfer of Care Note  Patient: Sean Barnett  Procedure(s) Performed: CARDIOVERSION (N/A )  Patient Location: PACU  Anesthesia Type:MAC  Level of Consciousness: drowsy  Airway & Oxygen Therapy: Patient Spontanous Breathing and Patient connected to face mask  Post-op Assessment: Report given to RN and Post -op Vital signs reviewed and stable  Post vital signs: Reviewed and stable  Last Vitals:  Vitals Value Taken Time  BP 138/80 09/27/19 1122  Temp    Pulse 41 09/27/19 1122  Resp 10 09/27/19 1122  SpO2 98 % 09/27/19 1122    Last Pain:  Vitals:   09/27/19 1113  TempSrc:   PainSc: 0-No pain         Complications: No apparent anesthesia complications

## 2019-09-27 NOTE — Anesthesia Preprocedure Evaluation (Signed)
Anesthesia Evaluation  Patient identified by MRN, date of birth, ID band Patient awake    Reviewed: Allergy & Precautions, NPO status , Patient's Chart, lab work & pertinent test results, reviewed documented beta blocker date and time   History of Anesthesia Complications Negative for: history of anesthetic complications  Airway Mallampati: II  TM Distance: >3 FB Neck ROM: Full    Dental no notable dental hx. (+)    Pulmonary Current Smoker,    Pulmonary exam normal        Cardiovascular hypertension, Pt. on medications and Pt. on home beta blockers +CHF  + dysrhythmias Atrial Fibrillation  Rhythm:Irregular Rate:Normal  TTE 03/2019: EF 30-35%, diffuse hypokinesis, RV with mildly reduced systolic function    Neuro/Psych negative neurological ROS  negative psych ROS   GI/Hepatic negative GI ROS, Neg liver ROS,   Endo/Other  negative endocrine ROS  Renal/GU negative Renal ROS  negative genitourinary   Musculoskeletal  (+) Arthritis ,   Abdominal   Peds  Hematology negative hematology ROS (+)   Anesthesia Other Findings Day of surgery medications reviewed with patient.  Reproductive/Obstetrics negative OB ROS                             Anesthesia Physical Anesthesia Plan  ASA: III  Anesthesia Plan: General   Post-op Pain Management:    Induction: Intravenous  PONV Risk Score and Plan: Treatment may vary due to age or medical condition and Propofol infusion  Airway Management Planned: Mask  Additional Equipment: None  Intra-op Plan:   Post-operative Plan:   Informed Consent: I have reviewed the patients History and Physical, chart, labs and discussed the procedure including the risks, benefits and alternatives for the proposed anesthesia with the patient or authorized representative who has indicated his/her understanding and acceptance.     Dental advisory given  Plan  Discussed with: CRNA  Anesthesia Plan Comments:         Anesthesia Quick Evaluation

## 2019-09-27 NOTE — Discharge Instructions (Signed)

## 2019-09-27 NOTE — Interval H&P Note (Signed)
History and Physical Interval Note:  09/27/2019 11:16 AM  Sean Barnett  has presented today for surgery, with the diagnosis of AFIB.  The various methods of treatment have been discussed with the patient and family. After consideration of risks, benefits and other options for treatment, the patient has consented to  Procedure(s): CARDIOVERSION (N/A) as a surgical intervention.  The patient's history has been reviewed, patient examined, no change in status, stable for surgery.  I have reviewed the patient's chart and labs.  Questions were answered to the patient's satisfaction.     Sean Barnett [employer]

## 2019-09-29 ENCOUNTER — Other Ambulatory Visit (HOSPITAL_COMMUNITY): Payer: Self-pay | Admitting: Cardiology

## 2019-10-02 ENCOUNTER — Other Ambulatory Visit: Payer: Self-pay

## 2019-10-02 ENCOUNTER — Ambulatory Visit (HOSPITAL_COMMUNITY)
Admission: RE | Admit: 2019-10-02 | Discharge: 2019-10-02 | Disposition: A | Discharge status: Home / Self Care | Payer: Medicaid Other | Source: Ambulatory Visit | Attending: Internal Medicine | Admitting: Internal Medicine

## 2019-10-02 DIAGNOSIS — I5022 Chronic systolic (congestive) heart failure: Secondary | ICD-10-CM | POA: Diagnosis not present

## 2019-10-02 LAB — BASIC METABOLIC PANEL
Anion gap: 9 (ref 5–15)
BUN: 15 mg/dL (ref 8–23)
CO2: 27 mmol/L (ref 22–32)
Calcium: 9 mg/dL (ref 8.9–10.3)
Chloride: 104 mmol/L (ref 98–111)
Creatinine, Ser: 1.08 mg/dL (ref 0.61–1.24)
GFR calc Af Amer: >60 mL/min (ref >60)
GFR calc non Af Amer: >60 mL/min (ref >60)
Glucose, Bld: 118 mg/dL — ABNORMAL HIGH (ref 70–99)
Potassium: 4 mmol/L (ref 3.5–5.1)
Sodium: 140 mmol/L (ref 135–145)

## 2019-10-04 ENCOUNTER — Encounter (HOSPITAL_COMMUNITY): Payer: Self-pay | Admitting: Cardiology

## 2019-10-04 ENCOUNTER — Ambulatory Visit (HOSPITAL_COMMUNITY)
Admission: RE | Admit: 2019-10-04 | Discharge: 2019-10-04 | Disposition: A | Discharge status: Home / Self Care | Payer: Medicaid Other | Source: Ambulatory Visit | Attending: Cardiology | Admitting: Cardiology

## 2019-10-04 ENCOUNTER — Other Ambulatory Visit: Payer: Self-pay

## 2019-10-04 VITALS — BP 150/92 | HR 42 | Wt 235.0 lb

## 2019-10-04 DIAGNOSIS — I428 Other cardiomyopathies: Secondary | ICD-10-CM | POA: Insufficient documentation

## 2019-10-04 DIAGNOSIS — I480 Paroxysmal atrial fibrillation: Secondary | ICD-10-CM | POA: Insufficient documentation

## 2019-10-04 DIAGNOSIS — I5022 Chronic systolic (congestive) heart failure: Secondary | ICD-10-CM | POA: Insufficient documentation

## 2019-10-04 DIAGNOSIS — I2510 Atherosclerotic heart disease of native coronary artery without angina pectoris: Secondary | ICD-10-CM | POA: Diagnosis not present

## 2019-10-04 DIAGNOSIS — I110 Hypertensive heart disease with heart failure: Secondary | ICD-10-CM | POA: Insufficient documentation

## 2019-10-04 DIAGNOSIS — Z79899 Other long term (current) drug therapy: Secondary | ICD-10-CM | POA: Insufficient documentation

## 2019-10-04 DIAGNOSIS — F17210 Nicotine dependence, cigarettes, uncomplicated: Secondary | ICD-10-CM | POA: Diagnosis not present

## 2019-10-04 DIAGNOSIS — Z7901 Long term (current) use of anticoagulants: Secondary | ICD-10-CM | POA: Diagnosis not present

## 2019-10-04 LAB — DIGOXIN LEVEL: Digoxin Level: 0.7 ng/mL — ABNORMAL LOW (ref 0.8–2.0)

## 2019-10-04 MED ORDER — SPIRONOLACTONE 25 MG PO TABS: 12.5000 mg | ORAL_TABLET | Freq: Every day | ORAL | 3 refills | Status: DC

## 2019-10-04 MED ORDER — SACUBITRIL-VALSARTAN 49-51 MG PO TABS: 1.0000 | ORAL_TABLET | Freq: Two times a day (BID) | ORAL | 6 refills | Status: DC

## 2019-10-04 NOTE — Patient Instructions (Signed)
INCREASE Entresto 49/51mg  (1 tab) twice a day  START Spironolactone 12.5mg  (1/2 tab) daily  Labs today We will only contact you if something comes back abnormal or we need to make some changes. Otherwise no news is good news!  You have been referred to Electrophysiology for treatment of atrial fibrillation.  They will call you to schedule an appointment.   Your physician recommends that you schedule a follow-up appointment in: 1 month with Dr Aundra Dubin  At the [facility], you and your health needs are our priority. As part of our continuing mission to provide you with exceptional heart care, we have created designated Provider Care Teams. These Care Teams include your primary Cardiologist (physician) and Advanced Practice Providers (APPs- Physician Assistants and Nurse Practitioners) who all work together to provide you with the care you need, when you need it.   You may see any of the following providers on your designated Care Team at your next follow up: Marland Kitchen Dr Glori Bickers . Dr [name] . Darrick Grinder, NP . Lyda Jester, PA   Please be sure to bring in all your medications bottles to every appointment.

## 2019-10-05 ENCOUNTER — Telehealth (HOSPITAL_COMMUNITY): Payer: Self-pay

## 2019-10-05 DIAGNOSIS — I5022 Chronic systolic (congestive) heart failure: Secondary | ICD-10-CM

## 2019-10-05 MED ORDER — ROSUVASTATIN CALCIUM 10 MG PO TABS: 10.0000 mg | ORAL_TABLET | Freq: Every day | ORAL | 1 refills | Status: DC

## 2019-10-05 NOTE — Progress Notes (Signed)
Left message to return call 

## 2019-10-05 NOTE — Telephone Encounter (Signed)
Called patient, aware need to start Crestor 10mg  daily with labs in 2 months. Verbalized understanding. appt card mailed

## 2019-10-05 NOTE — Telephone Encounter (Signed)
-----   Message from Larey Dresser, MD sent at 10/05/2019 12:42 AM EST ----- With CAD, please have him start on Crestor 10 mg daily.  He will need lipids/LFTs in 2 months.

## 2019-10-05 NOTE — Progress Notes (Signed)
PCP: Mosetta Anis, MD Cardiology: Dr. Aundra Dubin  68 y.o. with history of HTN and smoking was recently diagnosed with atrial fibrillation and chronic systolic CHF.  Patient was admitted in 4/20 with left elbow septic bursitis.  Several weeks prior to the admission, he had been feeling palpitations and had been tiring more easily.  At the time of admission, he reported chest pressure.  He was found to be in atrial fibrillation with RVR.  Echo was done, showing EF depressed to 30-35%.  He was started on Coreg and it was titrated up to control his HR.  He was started on Eliquis.  He was not cardioverted.   In 10/20, he had LHC/RHC showing moderate nonobstructive CAD, elevated PCWP, and CI 1.85.  He was started on digoxin.  Three weeks later after restarting apixaban he had had successful DCCV to NSR later in 10/20.   He returns for followup of CHF and atrial fibrillation. He feels good generally.  He does not have significant exertional dyspnea but he tires more easily than in the past.  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  No orthopnea/PND. No chest pain.    ECG (personally reviewed): sinus brady rate 45, left axis deviation, inferolateral TWIs  Labs (5/20): LDL 65 Labs (8/20): K 4.5, creatinine 1.1, hgb 13.1 Labs (11/20): K 4, creatinine 1.08, hgb 15.2  PMH: 1. HTN 2. Atrial fibrillation: Paroxysmal. - DCCV to NSR in 10/20.  3. Chronic systolic CHF: Nonischemic cardiomyopathy.  echo (4/20) with EF 30-35%, mildly decreased RV systolic function.  - LHC/RHC (10/20): 60% mid LAD, 40% PLOM, 50% dRCA; mean RA 2, PA 45/20, mean PCWP 22, CI 1.85, PVR 2.3 WU.  4. H/o septic bursitis 5. CAD: LHC in 10/20 with moderate nonobstructive disease (see above).   SH: Works as Games developer, widowed, smokes 1 ppd. Lives in [place].   FH: Father with lung cancer.  Brother with CABG in his 72s.   ROS: All systems reviewed and negative except as per  HPI.   Current Outpatient Medications  Medication Sig Dispense Refill  . apixaban (ELIQUIS) 5 MG TABS tablet Take 1 tablet (5 mg total) by mouth 2 (two) times daily. 60 tablet 3  . carvedilol (COREG) 25 MG tablet Take 0.5 tablets (12.5 mg total) by mouth 2 (two) times daily with a meal. 90 tablet 3  . digoxin (LANOXIN) 0.125 MG tablet TAKE 1 TABLET BY MOUTH EVERY DAY 90 tablet 4  . furosemide (LASIX) 20 MG tablet Take 1 tablet (20 mg total) by mouth every other day. 15 tablet 11  . sacubitril-valsartan (ENTRESTO) 49-51 MG Take 1 tablet by mouth 2 (two) times daily. 60 tablet 6  . spironolactone (ALDACTONE) 25 MG tablet Take 0.5 tablets (12.5 mg total) by mouth daily. 45 tablet 3   No current facility-administered medications for this encounter.    BP (!) 150/92   Pulse (!) 42   Wt 106.6 kg (235 lb)   SpO2 97%   BMI 28.61 kg/m  General: NAD Neck: No JVD, no thyromegaly or thyroid nodule.  Lungs: Clear to auscultation bilaterally with normal respiratory effort. CV: Nondisplaced PMI.  Heart regular S1/S2, no S3/S4, no murmur.  No peripheral edema.  No carotid bruit.  Normal pedal pulses.  Abdomen: Soft, nontender, no hepatosplenomegaly, no distention.  Skin: Intact without lesions or rashes.  Neurologic: Alert and oriented x 3.  Psych: Normal affect. Extremities: No clubbing or cyanosis.  HEENT: Normal.   Assessment/Plan: 1. Chronic systolic CHF: Echo in 4/20 with EF 30-35%, mildly decreased RV systolic function.  Nonischemic cardiomyopathy, RHC/LHC in 10/20 showed nonobstructive CAD and low output with CI 1.85.  It is possible that this is a tachycardia-mediated cardiomyopathy with EF down because of atrial fibrillation.  It is possible that the cardiomyopathy is from some other cause and that the cardiomyopathy itself predisposed him to atrial fibrillation.  NYHA class II.  He is not volume overloaded on exam.  - With sinus bradycardia in 40s and significant fatigue, I will decrease  his Coreg to 12.5 mg bid.  - Increase Entresto to 49/51 bid. BMET in 10 days.  - Add spironolactone 12.5 daily.  - Continue digoxin, check level today. - Echo 3 months after DCCV.  If he is in NSR and EF remains low, will need consider ICD.  He is not a CRT candidate with narrow QRS.  - He will eventually need a cardiac MRI.  2. Atrial fibrillation: Paroxysmal.  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  He had DCCV to NSR in 10/20.  - Continue Eliquis 5 mg bid.  - Refer to EP to consider atrial fibrillation ablation ([facility] data).  3. Smoking: I strongly recommended that he quit.  He does not want to use Chantix.  I recommended that he try nicotine patches.  4. HTN: BP still elevated, increasing Entresto and adding spironolactone.  5. CAD: Moderate nonobstructive CAD on 10/20 cath.  - No ASA given apixaban use.  - He needs to start on a statin given significant CAD. I will have him start Crestor 10 mg daily.   Followup 1 month.   Marca Ancona 10/05/2019

## 2019-10-13 ENCOUNTER — Encounter: Payer: Self-pay | Admitting: Internal Medicine

## 2019-10-13 ENCOUNTER — Other Ambulatory Visit: Payer: Self-pay

## 2019-10-13 ENCOUNTER — Telehealth (INDEPENDENT_AMBULATORY_CARE_PROVIDER_SITE_OTHER): Payer: Medicaid Other | Admitting: Internal Medicine

## 2019-10-13 ENCOUNTER — Telehealth: Payer: Self-pay

## 2019-10-13 VITALS — BP 134/77 | HR 58 | Ht 76.0 in | Wt 223.8 lb

## 2019-10-13 DIAGNOSIS — I4819 Other persistent atrial fibrillation: Secondary | ICD-10-CM | POA: Diagnosis not present

## 2019-10-13 DIAGNOSIS — I5022 Chronic systolic (congestive) heart failure: Secondary | ICD-10-CM | POA: Diagnosis not present

## 2019-10-13 NOTE — Telephone Encounter (Signed)
Call placed to pt.  Pt scheduled for afib ablation on December 06 2018 at 10:30 am  Covid test 1/4.

## 2019-10-13 NOTE — Progress Notes (Signed)
Electrophysiology TeleHealth Note   Due to national recommendations of social distancing due to COVID 19, Audio telehealth visit is felt to be most appropriate for this patient at this time.  See MyChart message from today for patient consent regarding telehealth for [facility].   Date:  10/13/2019   ID:  Sean Barnett, DOB [birth year], MRN [id number]  Location: home  Provider location: [address] [place] Evaluation Performed: New patient consult  PCP:  Theotis Barrio, MD  Cardiologist:  Dr Shirlee Latch Electrophysiologist:  None   Chief Complaint:  afib  History of Present Illness:    Sean Barnett is a 68 y.o. male who presents via audio conferencing for a telehealth visit today.   The patient is referred for new consultation regarding afib by Dr Shirlee Latch. He presented with bursitis in April of this year.  He was noted to be in afib.  He had CHF with EF 30%.  He was placed on coreg and eliquis.  He underwent cardioversion  08/2019.  He feels well with sinus but has significant bradycardia which limits medical therapy.  His RVR and palpitations resolved with cardioversion.  In retrospect, he thinks that he was in afib since this past December or January based on symptoms. He is referred by Dr Shirlee Latch for further AF management.   Today, he denies symptoms of  chest pain, shortness of breath, orthopnea, PND, lower extremity edema, claudication, dizziness, presyncope, syncope, bleeding, or neurologic sequela. The patient is tolerating medications without difficulties and is otherwise without complaint today.   he denies symptoms of cough, fevers, chills, or new SOB worrisome for COVID 19.   Past Medical History:  Diagnosis Date  . Bursitis of elbow 03/2019   left elbow  . DDD (degenerative disc disease), lumbar   . Dyspnea 03/30/2019  . Insomnia 03/30/2019  . Nonischemic cardiomyopathy (HCC)   . Nonobstructive CAD   . Persistent atrial fibrillation (HCC)   . Septic  bursitis of elbow, left 04/13/2019    Past Surgical History:  Procedure Laterality Date  . CARDIOVERSION N/A 09/27/2019   Procedure: CARDIOVERSION;  Surgeon: Laurey Morale, MD;  Location: [facility] ENDOSCOPY;  Service: Cardiovascular;  Laterality: N/A;  . DENTAL EXAMINATION UNDER ANESTHESIA    . RIGHT/LEFT HEART CATH AND CORONARY ANGIOGRAPHY N/A 09/06/2019   Procedure: RIGHT/LEFT HEART CATH AND CORONARY ANGIOGRAPHY;  Surgeon: Laurey Morale, MD;  Location: [facility] INVASIVE CV LAB;  Service: Cardiovascular;  Laterality: N/A;    Current Outpatient Medications  Medication Sig Dispense Refill  . apixaban (ELIQUIS) 5 MG TABS tablet Take 1 tablet (5 mg total) by mouth 2 (two) times daily. 60 tablet 3  . carvedilol (COREG) 25 MG tablet Take 0.5 tablets (12.5 mg total) by mouth 2 (two) times daily with a meal. 90 tablet 3  . digoxin (LANOXIN) 0.125 MG tablet TAKE 1 TABLET BY MOUTH EVERY DAY 90 tablet 4  . furosemide (LASIX) 20 MG tablet Take 1 tablet (20 mg total) by mouth every other day. 15 tablet 11  . rosuvastatin (CRESTOR) 10 MG tablet Take 1 tablet (10 mg total) by mouth daily. 90 tablet 1  . sacubitril-valsartan (ENTRESTO) 49-51 MG Take 1 tablet by mouth 2 (two) times daily. 60 tablet 6  . spironolactone (ALDACTONE) 25 MG tablet Take 0.5 tablets (12.5 mg total) by mouth daily. 45 tablet 3   No current facility-administered medications for this visit.     Allergies:   Sulfa antibiotics   Social History:  The patient  reports that he has been smoking cigarettes. He has a 50.00 pack-year smoking history. He has never used smokeless tobacco. He reports current alcohol use. He reports that he does not use drugs.   Family History:  The patient's family history includes Lung cancer in his father.    ROS:  Please see the history of present illness.   All other systems are personally reviewed and negative.    Exam:    Vital Signs:  BP 134/77   Pulse (!) 58   Ht 6\' 4"  (1.93 m)   Wt 223 lb 12.8 oz  (101.5 kg)   BMI 27.24 kg/m   Well sounding, alert and conversant    Labs/Other Tests and Data Reviewed:    Recent Labs: 03/24/2019: ALT 30; TSH 2.450 08/29/2019: B Natriuretic Peptide 737.2 09/25/2019: Hemoglobin 15.2; Platelets 280 10/02/2019: BUN 15; Creatinine, Ser 1.08; Potassium 4.0; Sodium 140   Wt Readings from Last 3 Encounters:  10/13/19 223 lb 12.8 oz (101.5 kg)  10/04/19 235 lb (106.6 kg)  09/27/19 228 lb (103.4 kg)     Other studies personally reviewed: Additional studies/ records that were reviewed today include: prior echo, Dr Oleh Genin notes Review of the above records today demonstrates: as above    ASSESSMENT & PLAN:    1.  Atrial fibrillation The patient has symptomatic, recurrent persistent atrial fibrillation. Medical therapy is limited by CHF and bradycardia Chads2vasc score is 3.  he is anticoagulated with eliquis . Therapeutic strategies for afib including medicine and ablation were discussed in detail with the patient today. Risk, benefits, and alternatives to EP study and radiofrequency ablation for afib were also discussed in detail today. These risks include but are not limited to stroke, bleeding, vascular damage, tamponade, perforation, damage to the esophagus, lungs, and other structures, pulmonary vein stenosis, worsening renal function, and death. The patient understands these risk and wishes to proceed.  We will therefore proceed with catheter ablation at the next available time.  Carto, ICE, anesthesia are requested for the procedure.  Will also obtain cardiac CT prior to the procedure to exclude LAA thrombus and further evaluate atrial anatomy.  2. Nonischemic CM Likely tachycardia mediated Important to remain in sinus long term.    Current medicines are reviewed at length with the patient today.   The patient does not have concerns regarding his medicines.  The following changes were made today:  none  Labs/ tests ordered today include:  No  orders of the defined types were placed in this encounter.   Patient Risk:  after full review of this patients clinical status, I feel that they are at high risk at this time.   Today, I have spent 25 minutes with the patient with telehealth technology discussing afib .    SignedThompson Grayer MD, [facility] 10/13/2019 11:24 AM   [address] [place] [address] [phone] (office) [phone] (fax)

## 2019-10-13 NOTE — Telephone Encounter (Signed)
Confirmed lab work on 1/4 at heart failure clinic.  Instruction letters completed and will mail out today.  Work up complete.

## 2019-10-13 NOTE — Telephone Encounter (Signed)
-----   Message from Thompson Grayer, MD sent at 10/13/2019 11:30 AM EST ----- Afib ablation Carto/ICE/anesthesia  Cardiac CT He thinks he wants to wait until January.Marland KitchenMarland Kitchen

## 2019-10-27 ENCOUNTER — Other Ambulatory Visit: Payer: Self-pay | Admitting: Internal Medicine

## 2019-10-27 DIAGNOSIS — I4891 Unspecified atrial fibrillation: Secondary | ICD-10-CM

## 2019-11-09 ENCOUNTER — Encounter (HOSPITAL_COMMUNITY): Payer: Self-pay | Admitting: Cardiology

## 2019-11-09 ENCOUNTER — Other Ambulatory Visit: Payer: Self-pay

## 2019-11-09 ENCOUNTER — Ambulatory Visit (HOSPITAL_COMMUNITY)
Admission: RE | Admit: 2019-11-09 | Discharge: 2019-11-09 | Disposition: A | Discharge status: Home / Self Care | Payer: Medicaid Other | Source: Ambulatory Visit | Attending: Cardiology | Admitting: Cardiology

## 2019-11-09 VITALS — BP 100/62 | HR 63 | Wt 228.4 lb

## 2019-11-09 DIAGNOSIS — Z79899 Other long term (current) drug therapy: Secondary | ICD-10-CM | POA: Diagnosis not present

## 2019-11-09 DIAGNOSIS — F17210 Nicotine dependence, cigarettes, uncomplicated: Secondary | ICD-10-CM | POA: Insufficient documentation

## 2019-11-09 DIAGNOSIS — I110 Hypertensive heart disease with heart failure: Secondary | ICD-10-CM | POA: Insufficient documentation

## 2019-11-09 DIAGNOSIS — I5022 Chronic systolic (congestive) heart failure: Secondary | ICD-10-CM | POA: Diagnosis not present

## 2019-11-09 DIAGNOSIS — I480 Paroxysmal atrial fibrillation: Secondary | ICD-10-CM | POA: Diagnosis not present

## 2019-11-09 DIAGNOSIS — I428 Other cardiomyopathies: Secondary | ICD-10-CM | POA: Insufficient documentation

## 2019-11-09 DIAGNOSIS — Z7901 Long term (current) use of anticoagulants: Secondary | ICD-10-CM | POA: Insufficient documentation

## 2019-11-09 DIAGNOSIS — I4819 Other persistent atrial fibrillation: Secondary | ICD-10-CM

## 2019-11-09 DIAGNOSIS — I2510 Atherosclerotic heart disease of native coronary artery without angina pectoris: Secondary | ICD-10-CM | POA: Insufficient documentation

## 2019-11-09 LAB — COMPREHENSIVE METABOLIC PANEL
ALT: 20 U/L (ref 0–44)
AST: 20 U/L (ref 15–41)
Albumin: 3.4 g/dL — ABNORMAL LOW (ref 3.5–5.0)
Alkaline Phosphatase: 114 U/L (ref 38–126)
Anion gap: 9 (ref 5–15)
BUN: 21 mg/dL (ref 8–23)
CO2: 23 mmol/L (ref 22–32)
Calcium: 8.7 mg/dL — ABNORMAL LOW (ref 8.9–10.3)
Chloride: 107 mmol/L (ref 98–111)
Creatinine, Ser: 1.17 mg/dL (ref 0.61–1.24)
GFR calc Af Amer: >60 mL/min (ref >60)
GFR calc non Af Amer: >60 mL/min (ref >60)
Glucose, Bld: 97 mg/dL (ref 70–99)
Potassium: 5 mmol/L (ref 3.5–5.1)
Sodium: 139 mmol/L (ref 135–145)
Total Bilirubin: 1.4 mg/dL — ABNORMAL HIGH (ref 0.3–1.2)
Total Protein: 7 g/dL (ref 6.5–8.1)

## 2019-11-09 LAB — CBC
HCT: 43.3 % (ref 39.0–52.0)
Hemoglobin: 14.4 g/dL (ref 13.0–17.0)
MCH: 30.1 pg (ref 26.0–34.0)
MCHC: 33.3 g/dL (ref 30.0–36.0)
MCV: 90.4 fL (ref 80.0–100.0)
Platelets: 291 10*3/uL (ref 150–400)
RBC: 4.79 MIL/uL (ref 4.22–5.81)
RDW: 16.1 % — ABNORMAL HIGH (ref 11.5–15.5)
WBC: 10.9 10*3/uL — ABNORMAL HIGH (ref 4.0–10.5)
nRBC: 0 % (ref 0.0–0.2)

## 2019-11-09 LAB — LIPID PANEL
Cholesterol: 94 mg/dL (ref 0–200)
HDL: 28 mg/dL — ABNORMAL LOW (ref >40)
LDL Cholesterol: 56 mg/dL (ref 0–99)
Total CHOL/HDL Ratio: 3.4 RATIO
Triglycerides: 51 mg/dL (ref <150)
VLDL: 10 mg/dL (ref 0–40)

## 2019-11-09 LAB — DIGOXIN LEVEL: Digoxin Level: 0.7 ng/mL — ABNORMAL LOW (ref 0.8–2.0)

## 2019-11-09 MED ORDER — SPIRONOLACTONE 25 MG PO TABS: 12.5000 mg | ORAL_TABLET | Freq: Every evening | ORAL | 3 refills | Status: DC

## 2019-11-09 NOTE — H&P (View-Only) (Signed)
PCP: Mosetta Anis, MD Cardiology: Dr. Aundra Dubin  68 y.o. with history of HTN and smoking was recently diagnosed with atrial fibrillation and chronic systolic CHF.  Patient was admitted in 4/20 with left elbow septic bursitis.  Several weeks prior to the admission, he had been feeling palpitations and had been tiring more easily.  At the time of admission, he reported chest pressure.  He was found to be in atrial fibrillation with RVR.  Echo was done, showing EF depressed to 30-35%.  He was started on Coreg and it was titrated up to control his HR.  He was started on Eliquis.  He was not cardioverted.   In 10/20, he had LHC/RHC showing moderate nonobstructive CAD, elevated PCWP, and CI 1.85.  He was started on digoxin.  Three weeks later after restarting apixaban he had had successful DCCV to NSR later in 10/20.   He returns for followup of CHF and atrial fibrillation.  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 Feels good overall though he is fatigued by the afternoon on most days.  No significant exertional dyspnea.  Weight is down 7 lbs.  SBP runs in the 90s-100s at home, he occasionally gets lightheaded if he stands up too fast.  No orthopnea/PND.  No chest pain.  Still smoking.  He does not feel palpitations, but today was noted to be back in atrial fibrillation.  He has seen Dr. Rayann Heman and is scheduled for atrial fibrillation ablation in early January.   ECG (personally reviewed): atrial fibrillation, LAFB, nonspecific T wave flattening.   Labs (5/20): LDL 65 Labs (8/20): K 4.5, creatinine 1.1, hgb 13.1 Labs (11/20): K 4, creatinine 1.08, hgb 15, digoxin 0.7  PMH: 1. HTN 2. Atrial fibrillation: Paroxysmal. - DCCV to NSR in 10/20.  3. Chronic systolic CHF: Nonischemic cardiomyopathy.  echo (4/20) with EF 30-35%, mildly decreased RV systolic function.  - LHC/RHC (10/20): 60% mid LAD, 40% PLOM, 50% dRCA; mean RA 2, PA 45/20, mean PCWP 22, CI 1.85, PVR 2.3 WU.  4. H/o septic bursitis 5. CAD: LHC in  10/20 with moderate nonobstructive disease (see above).   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   FH: Father with lung cancer.  Brother with CABG in his 85s.   ROS: All systems reviewed and negative except as per HPI.   Current Outpatient Medications  Medication Sig Dispense Refill  . carvedilol (COREG) 25 MG tablet Take 0.5 tablets (12.5 mg total) by mouth 2 (two) times daily with a meal. 90 tablet 3  . digoxin (LANOXIN) 0.125 MG tablet TAKE 1 TABLET BY MOUTH EVERY DAY 90 tablet 4  . ELIQUIS 5 MG TABS tablet TAKE 1 TABLET BY MOUTH TWICE A DAY 60 tablet 3  . rosuvastatin (CRESTOR) 10 MG tablet Take 1 tablet (10 mg total) by mouth daily. 90 tablet 1  . sacubitril-valsartan (ENTRESTO) 49-51 MG Take 1 tablet by mouth 2 (two) times daily. 60 tablet 6  . spironolactone (ALDACTONE) 25 MG tablet Take 0.5 tablets (12.5 mg total) by mouth every evening. 45 tablet 3   No current facility-administered medications for this encounter.   BP 100/62   Pulse 63   Wt 103.6 kg (228 lb 6.4 oz)   SpO2 99%   BMI 27.80 kg/m  General: NAD Neck: No JVD, no thyromegaly or thyroid nodule.  Lungs: Clear to auscultation bilaterally with normal respiratory effort. CV: Nondisplaced PMI.  Heart irregular S1/S2, no S3/S4, no murmur.  No peripheral edema.  No carotid bruit.  Normal pedal pulses.  Abdomen: Soft, nontender, no hepatosplenomegaly, no distention.  Skin: Intact without lesions or rashes.  Neurologic: Alert and oriented x 3.  Psych: Normal affect. Extremities: No clubbing or cyanosis.  HEENT: Normal.   Assessment/Plan: 1. Chronic systolic CHF: Echo in 4/20 with EF 30-35%, mildly decreased RV systolic function.  Nonischemic cardiomyopathy, RHC/LHC in 10/20 showed nonobstructive CAD and low output with CI 1.85.  It is possible that this is a tachycardia-mediated cardiomyopathy with EF down because of atrial fibrillation.  It is also possible that the cardiomyopathy is from  some other cause and that the cardiomyopathy itself predisposed him to atrial fibrillation.  NYHA class II.  He is not volume overloaded on exam. Unfortunately, he is back in atrial fibrillation today though the rate is well-controlled. SBP is often in the 90s, I do not think that there is room to titrate up his cardiac meds today.  - Continue Coreg 12.5 mg bid.  - Continue Entresto 49/51 bid.  - Continue spironolactone 12.5 mg daily but take in the evening (less effect on BP during the day).  BMET today.  - Continue digoxin, check level today. - Cardiac MRI to reassess EF and to assess for infiltrative disease.  If EF remains low, will need to consider ICD (not CRT candidate).  - I would like to see him back in NSR.  2. Atrial fibrillation: Paroxysmal.  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  He had DCCV to NSR in 10/20 but noted this visit to be back in rate-controlled atrial fibrillation.   - Continue Eliquis 5 mg bid.  - I think he needs atrial fibrillation ablation ([facility]-HF data).  This is planned for 12/07/19.  - I will discuss with Dr. Johney Frame to see if he thinks it would be beneficial to get Mr Fix back in NSR prior to his afib ablation (?DCCV on amiodarone).  3. Smoking: I strongly recommended that he quit.  He does not want to use Chantix and says he is not ready yet.  4. HTN: BP now running on the lower side.  5. CAD: Moderate nonobstructive CAD on 10/20 cath.  - No ASA given apixaban use.  - Continue Crestor 10 mg daily, lipids/LFTs today.   Followup 2 months.    Marca Ancona 11/09/2019

## 2019-11-09 NOTE — Progress Notes (Signed)
PCP: Mosetta Anis, MD Cardiology: Dr. Aundra Dubin  68 y.o. with history of HTN and smoking was recently diagnosed with atrial fibrillation and chronic systolic CHF.  Patient was admitted in 4/20 with left elbow septic bursitis.  Several weeks prior to the admission, he had been feeling palpitations and had been tiring more easily.  At the time of admission, he reported chest pressure.  He was found to be in atrial fibrillation with RVR.  Echo was done, showing EF depressed to 30-35%.  He was started on Coreg and it was titrated up to control his HR.  He was started on Eliquis.  He was not cardioverted.   In 10/20, he had LHC/RHC showing moderate nonobstructive CAD, elevated PCWP, and CI 1.85.  He was started on digoxin.  Three weeks later after restarting apixaban he had had successful DCCV to NSR later in 10/20.   He returns for followup of CHF and atrial fibrillation.  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 Feels good overall though he is fatigued by the afternoon on most days.  No significant exertional dyspnea.  Weight is down 7 lbs.  SBP runs in the 90s-100s at home, he occasionally gets lightheaded if he stands up too fast.  No orthopnea/PND.  No chest pain.  Still smoking.  He does not feel palpitations, but today was noted to be back in atrial fibrillation.  He has seen Dr. Rayann Heman and is scheduled for atrial fibrillation ablation in early January.   ECG (personally reviewed): atrial fibrillation, LAFB, nonspecific T wave flattening.   Labs (5/20): LDL 65 Labs (8/20): K 4.5, creatinine 1.1, hgb 13.1 Labs (11/20): K 4, creatinine 1.08, hgb 15, digoxin 0.7  PMH: 1. HTN 2. Atrial fibrillation: Paroxysmal. - DCCV to NSR in 10/20.  3. Chronic systolic CHF: Nonischemic cardiomyopathy.  echo (4/20) with EF 30-35%, mildly decreased RV systolic function.  - LHC/RHC (10/20): 60% mid LAD, 40% PLOM, 50% dRCA; mean RA 2, PA 45/20, mean PCWP 22, CI 1.85, PVR 2.3 WU.  4. H/o septic bursitis 5. CAD: LHC in  10/20 with moderate nonobstructive disease (see above).   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   FH: Father with lung cancer.  Brother with CABG in his 85s.   ROS: All systems reviewed and negative except as per HPI.   Current Outpatient Medications  Medication Sig Dispense Refill  . carvedilol (COREG) 25 MG tablet Take 0.5 tablets (12.5 mg total) by mouth 2 (two) times daily with a meal. 90 tablet 3  . digoxin (LANOXIN) 0.125 MG tablet TAKE 1 TABLET BY MOUTH EVERY DAY 90 tablet 4  . ELIQUIS 5 MG TABS tablet TAKE 1 TABLET BY MOUTH TWICE A DAY 60 tablet 3  . rosuvastatin (CRESTOR) 10 MG tablet Take 1 tablet (10 mg total) by mouth daily. 90 tablet 1  . sacubitril-valsartan (ENTRESTO) 49-51 MG Take 1 tablet by mouth 2 (two) times daily. 60 tablet 6  . spironolactone (ALDACTONE) 25 MG tablet Take 0.5 tablets (12.5 mg total) by mouth every evening. 45 tablet 3   No current facility-administered medications for this encounter.   BP 100/62   Pulse 63   Wt 103.6 kg (228 lb 6.4 oz)   SpO2 99%   BMI 27.80 kg/m  General: NAD Neck: No JVD, no thyromegaly or thyroid nodule.  Lungs: Clear to auscultation bilaterally with normal respiratory effort. CV: Nondisplaced PMI.  Heart irregular S1/S2, no S3/S4, no murmur.  No peripheral edema.  No carotid bruit.  Normal pedal pulses.  Abdomen: Soft, nontender, no hepatosplenomegaly, no distention.  Skin: Intact without lesions or rashes.  Neurologic: Alert and oriented x 3.  Psych: Normal affect. Extremities: No clubbing or cyanosis.  HEENT: Normal.   Assessment/Plan: 1. Chronic systolic CHF: Echo in 4/20 with EF 30-35%, mildly decreased RV systolic function.  Nonischemic cardiomyopathy, RHC/LHC in 10/20 showed nonobstructive CAD and low output with CI 1.85.  It is possible that this is a tachycardia-mediated cardiomyopathy with EF down because of atrial fibrillation.  It is also possible that the cardiomyopathy is from  some other cause and that the cardiomyopathy itself predisposed him to atrial fibrillation.  NYHA class II.  He is not volume overloaded on exam. Unfortunately, he is back in atrial fibrillation today though the rate is well-controlled. SBP is often in the 90s, I do not think that there is room to titrate up his cardiac meds today.  - Continue Coreg 12.5 mg bid.  - Continue Entresto 49/51 bid.  - Continue spironolactone 12.5 mg daily but take in the evening (less effect on BP during the day).  BMET today.  - Continue digoxin, check level today. - Cardiac MRI to reassess EF and to assess for infiltrative disease.  If EF remains low, will need to consider ICD (not CRT candidate).  - I would like to see him back in NSR.  2. Atrial fibrillation: Paroxysmal.  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  He had DCCV to NSR in 10/20 but noted this visit to be back in rate-controlled atrial fibrillation.   - Continue Eliquis 5 mg bid.  - I think he needs atrial fibrillation ablation ([facility]-HF data).  This is planned for 12/07/19.  - I will discuss with Dr. Johney Frame to see if he thinks it would be beneficial to get Mr Fix back in NSR prior to his afib ablation (?DCCV on amiodarone).  3. Smoking: I strongly recommended that he quit.  He does not want to use Chantix and says he is not ready yet.  4. HTN: BP now running on the lower side.  5. CAD: Moderate nonobstructive CAD on 10/20 cath.  - No ASA given apixaban use.  - Continue Crestor 10 mg daily, lipids/LFTs today.   Followup 2 months.    Marca Ancona 11/09/2019

## 2019-11-09 NOTE — Patient Instructions (Addendum)
STOP Lasix (Furosemide)  TAKE Spironolactone in the evening  Labs today We will only contact you if something comes back abnormal or we need to make some changes. Otherwise no news is good news!  You have been ordered for a Cardiac MRI. They will call you to schedule this appointment.  Your physician recommends that you schedule a follow-up appointment in: 2 months with Dr Aundra Dubin  At the [facility], you and your health needs are our priority. As part of our continuing mission to provide you with exceptional heart care, we have created designated Provider Care Teams. These Care Teams include your primary Cardiologist (physician) and Advanced Practice Providers (APPs- Physician Assistants and Nurse Practitioners) who all work together to provide you with the care you need, when you need it.   You may see any of the following providers on your designated Care Team at your next follow up: Marland Kitchen Dr Glori Bickers . Dr [name] . Darrick Grinder, NP . Lyda Jester, PA . Audry Riles, PharmD   Please be sure to bring in all your medications bottles to every appointment.

## 2019-11-13 NOTE — Progress Notes (Signed)
Please let Mr Dileonardo know that I discussed his recurrent atrial fibrillation with Dr. Rayann Heman. He recommends that we try to get him back in NSR prior to his ablation.  Would recommend amiodarone 200 mg bid then DCCV in 10-14 days if he is amenable to this (and has not missed anticoagulant doses).

## 2019-11-15 ENCOUNTER — Other Ambulatory Visit (HOSPITAL_COMMUNITY): Payer: Self-pay

## 2019-11-15 ENCOUNTER — Telehealth (HOSPITAL_COMMUNITY): Payer: Self-pay

## 2019-11-15 DIAGNOSIS — I5022 Chronic systolic (congestive) heart failure: Secondary | ICD-10-CM

## 2019-11-15 MED ORDER — AMIODARONE HCL 200 MG PO TABS: 200.0000 mg | ORAL_TABLET | Freq: Two times a day (BID) | ORAL | 0 refills | Status: DC

## 2019-11-15 NOTE — Telephone Encounter (Signed)
Instructions mailed.

## 2019-11-15 NOTE — Telephone Encounter (Signed)
Per MD, pt needs DCCV for recurrent afib. DCCV, labs and covid screening scheduled.  All instructions discussed. Questions answered. Pt will start amio 200mg  bid x2 weeks. Pt will continue eliquis as prescribed. Advised not to miss a dose. Verbalized understanding.

## 2019-11-23 ENCOUNTER — Other Ambulatory Visit (HOSPITAL_COMMUNITY): Payer: Self-pay | Admitting: Cardiology

## 2019-11-27 ENCOUNTER — Other Ambulatory Visit (HOSPITAL_COMMUNITY)
Admission: RE | Admit: 2019-11-27 | Discharge: 2019-11-27 | Disposition: A | Discharge status: Home / Self Care | Payer: Medicaid Other | Source: Ambulatory Visit | Attending: Cardiology | Admitting: Cardiology

## 2019-11-27 ENCOUNTER — Ambulatory Visit (HOSPITAL_COMMUNITY)
Admission: RE | Admit: 2019-11-27 | Discharge: 2019-11-27 | Disposition: A | Discharge status: Home / Self Care | Payer: Medicaid Other | Source: Ambulatory Visit | Attending: Cardiology | Admitting: Cardiology

## 2019-11-27 ENCOUNTER — Other Ambulatory Visit: Payer: Self-pay

## 2019-11-27 DIAGNOSIS — Z20828 Contact with and (suspected) exposure to other viral communicable diseases: Secondary | ICD-10-CM | POA: Diagnosis not present

## 2019-11-27 DIAGNOSIS — I5022 Chronic systolic (congestive) heart failure: Secondary | ICD-10-CM | POA: Insufficient documentation

## 2019-11-27 LAB — CBC
HCT: 43 % (ref 39.0–52.0)
Hemoglobin: 14 g/dL (ref 13.0–17.0)
MCH: 30 pg (ref 26.0–34.0)
MCHC: 32.6 g/dL (ref 30.0–36.0)
MCV: 92.3 fL (ref 80.0–100.0)
Platelets: 312 10*3/uL (ref 150–400)
RBC: 4.66 MIL/uL (ref 4.22–5.81)
RDW: 16.6 % — ABNORMAL HIGH (ref 11.5–15.5)
WBC: 11.6 10*3/uL — ABNORMAL HIGH (ref 4.0–10.5)
nRBC: 0 % (ref 0.0–0.2)

## 2019-11-27 LAB — BASIC METABOLIC PANEL
Anion gap: 9 (ref 5–15)
BUN: 17 mg/dL (ref 8–23)
CO2: 27 mmol/L (ref 22–32)
Calcium: 9 mg/dL (ref 8.9–10.3)
Chloride: 104 mmol/L (ref 98–111)
Creatinine, Ser: 1.46 mg/dL — ABNORMAL HIGH (ref 0.61–1.24)
GFR calc Af Amer: 56 mL/min — ABNORMAL LOW (ref >60)
GFR calc non Af Amer: 49 mL/min — ABNORMAL LOW (ref >60)
Glucose, Bld: 107 mg/dL — ABNORMAL HIGH (ref 70–99)
Potassium: 4 mmol/L (ref 3.5–5.1)
Sodium: 140 mmol/L (ref 135–145)

## 2019-11-27 LAB — PROTIME-INR
INR: 1.2 (ref 0.8–1.2)
Prothrombin Time: 15.4 seconds — ABNORMAL HIGH (ref 11.4–15.2)

## 2019-11-28 LAB — NOVEL CORONAVIRUS, NAA (HOSP ORDER, SEND-OUT TO REF LAB; TAT 18-24 HRS): SARS-CoV-2, NAA: NOT DETECTED

## 2019-11-29 NOTE — Anesthesia Preprocedure Evaluation (Addendum)
Anesthesia Evaluation  Patient identified by MRN, date of birth, ID band Patient awake    Reviewed: Allergy & Precautions, NPO status , Patient's Chart, lab work & pertinent test results  Airway Mallampati: II  TM Distance: >3 FB Neck ROM: Full    Dental no notable dental hx. (+) Teeth Intact,    Pulmonary Current Smoker,    Pulmonary exam normal breath sounds clear to auscultation       Cardiovascular hypertension, Pt. on medications and Pt. on home beta blockers +CHF  negative cardio ROS Normal cardiovascular exam+ dysrhythmias Atrial Fibrillation  Rhythm:Regular Rate:Normal  EF 30-35%   Neuro/Psych negative neurological ROS  negative psych ROS   GI/Hepatic negative GI ROS, Neg liver ROS,   Endo/Other  negative endocrine ROS  Renal/GU negative Renal ROSCr 1.46     Musculoskeletal   Abdominal   Peds  Hematology INR 1.2   Anesthesia Other Findings   Reproductive/Obstetrics                           Anesthesia Physical Anesthesia Plan  ASA: IV  Anesthesia Plan: General   Post-op Pain Management:    Induction: Intravenous  PONV Risk Score and Plan: Treatment may vary due to age or medical condition  Airway Management Planned: Nasal Cannula and Natural Airway  Additional Equipment: None  Intra-op Plan:   Post-operative Plan:   Informed Consent:     Dental advisory given  Plan Discussed with:   Anesthesia Plan Comments:         Anesthesia Quick Evaluation

## 2019-11-30 ENCOUNTER — Encounter (HOSPITAL_COMMUNITY)
Admission: RE | Disposition: A | Discharge status: Home / Self Care | Payer: Self-pay | Source: Home / Self Care | Attending: Cardiology

## 2019-11-30 ENCOUNTER — Telehealth (HOSPITAL_COMMUNITY): Payer: Self-pay | Admitting: Emergency Medicine

## 2019-11-30 ENCOUNTER — Ambulatory Visit (HOSPITAL_COMMUNITY): Payer: Medicaid Other | Admitting: Anesthesiology

## 2019-11-30 ENCOUNTER — Ambulatory Visit (HOSPITAL_COMMUNITY)
Admission: RE | Admit: 2019-11-30 | Discharge: 2019-11-30 | Disposition: A | Discharge status: Home / Self Care | Payer: Medicaid Other | Attending: Cardiology | Admitting: Cardiology

## 2019-11-30 ENCOUNTER — Encounter (HOSPITAL_COMMUNITY): Payer: Self-pay | Admitting: Cardiology

## 2019-11-30 ENCOUNTER — Other Ambulatory Visit: Payer: Self-pay

## 2019-11-30 DIAGNOSIS — F17210 Nicotine dependence, cigarettes, uncomplicated: Secondary | ICD-10-CM | POA: Diagnosis not present

## 2019-11-30 DIAGNOSIS — I2510 Atherosclerotic heart disease of native coronary artery without angina pectoris: Secondary | ICD-10-CM | POA: Diagnosis not present

## 2019-11-30 DIAGNOSIS — Z7901 Long term (current) use of anticoagulants: Secondary | ICD-10-CM | POA: Diagnosis not present

## 2019-11-30 DIAGNOSIS — I110 Hypertensive heart disease with heart failure: Secondary | ICD-10-CM | POA: Insufficient documentation

## 2019-11-30 DIAGNOSIS — I428 Other cardiomyopathies: Secondary | ICD-10-CM | POA: Insufficient documentation

## 2019-11-30 DIAGNOSIS — I480 Paroxysmal atrial fibrillation: Secondary | ICD-10-CM | POA: Insufficient documentation

## 2019-11-30 DIAGNOSIS — I5023 Acute on chronic systolic (congestive) heart failure: Secondary | ICD-10-CM | POA: Diagnosis not present

## 2019-11-30 DIAGNOSIS — Z79899 Other long term (current) drug therapy: Secondary | ICD-10-CM | POA: Diagnosis not present

## 2019-11-30 DIAGNOSIS — I5022 Chronic systolic (congestive) heart failure: Secondary | ICD-10-CM | POA: Diagnosis not present

## 2019-11-30 DIAGNOSIS — I4891 Unspecified atrial fibrillation: Secondary | ICD-10-CM | POA: Diagnosis not present

## 2019-11-30 HISTORY — PX: CARDIOVERSION: SHX1299

## 2019-11-30 SURGERY — CARDIOVERSION
Anesthesia: General

## 2019-11-30 MED ORDER — SODIUM CHLORIDE 0.9 % IV SOLN
INTRAVENOUS | Status: AC | PRN
  Administered 2019-11-30: 500 mL via INTRAVENOUS

## 2019-11-30 MED ORDER — AMIODARONE HCL 200 MG PO TABS: 200.0000 mg | ORAL_TABLET | Freq: Every day | ORAL | 5 refills | Status: DC

## 2019-11-30 MED ORDER — PROPOFOL 10 MG/ML IV BOLUS
INTRAVENOUS | Status: DC | PRN
  Administered 2019-11-30: 80 ug via INTRAVENOUS
  Administered 2019-11-30: 20 ug via INTRAVENOUS

## 2019-11-30 MED ORDER — LIDOCAINE HCL (CARDIAC) PF 100 MG/5ML IV SOSY
PREFILLED_SYRINGE | INTRAVENOUS | Status: DC | PRN
  Administered 2019-11-30: 80 mg via INTRAVENOUS

## 2019-11-30 NOTE — Procedures (Signed)
Electrical Cardioversion Procedure Note Sean Barnett [id number] July 12, 1951  Procedure: Electrical Cardioversion Indications:  Atrial Fibrillation  Procedure Details Consent: Risks of procedure as well as the alternatives and risks of each were explained to the (patient/caregiver).  Consent for procedure obtained. Time Out: Verified patient identification, verified procedure, site/side was marked, verified correct patient position, special equipment/implants available, medications/allergies/relevent history reviewed, required imaging and test results available.  Performed  Patient placed on cardiac monitor, pulse oximetry, supplemental oxygen as necessary.  Sedation given: Propofol per anesthesiology Pacer pads placed anterior and posterior chest.  Cardioverted 3 times, the 2nd two times with sternal pressure.  Cardioverted at [place].  Evaluation Findings: Post procedure EKG shows: Atrial Fibrillation Complications: None Patient did tolerate procedure well.   [name] 11/30/2019, 2:22 PM

## 2019-11-30 NOTE — Transfer of Care (Signed)
Immediate Anesthesia Transfer of Care Note  Patient: Sean Barnett  Procedure(s) Performed: CARDIOVERSION (N/A )  Patient Location: Endoscopy Unit  Anesthesia Type:General  Level of Consciousness: awake, alert , oriented and patient cooperative  Airway & Oxygen Therapy: Patient Spontanous Breathing and Patient connected to nasal cannula oxygen  Post-op Assessment: Report given to RN and Post -op Vital signs reviewed and stable  Post vital signs: Reviewed and stable  Last Vitals:  Vitals Value Taken Time  BP 135/84 11/30/19 1426  Temp    Pulse 63 11/30/19 1426  Resp 13 11/30/19 1426  SpO2 100 % 11/30/19 1426    Last Pain:  Vitals:   11/30/19 1426  TempSrc:   PainSc: 0-No pain         Complications: No apparent anesthesia complications

## 2019-11-30 NOTE — Interval H&P Note (Signed)
History and Physical Interval Note:  11/30/2019 2:11 PM  Sean Barnett  has presented today for surgery, with the diagnosis of A-FIB.  The various methods of treatment have been discussed with the patient and family. After consideration of risks, benefits and other options for treatment, the patient has consented to  Procedure(s): CARDIOVERSION (N/A) as a surgical intervention.  The patient's history has been reviewed, patient examined, no change in status, stable for surgery.  I have reviewed the patient's chart and labs.  Questions were answered to the patient's satisfaction.     Aydin Cavalieri [employer]

## 2019-11-30 NOTE — Anesthesia Postprocedure Evaluation (Signed)
Anesthesia Post Note  Patient: Loleta Chance  Procedure(s) Performed: CARDIOVERSION (N/A )     Patient location during evaluation: Endoscopy Anesthesia Type: General Level of consciousness: awake and alert Pain management: pain level controlled Vital Signs Assessment: post-procedure vital signs reviewed and stable Respiratory status: spontaneous breathing, nonlabored ventilation, respiratory function stable and patient connected to nasal cannula oxygen Cardiovascular status: blood pressure returned to baseline and stable Postop Assessment: no apparent nausea or vomiting Anesthetic complications: no    Last Vitals:  Vitals:   11/30/19 1207 11/30/19 1426  BP: (!) 171/104 135/84  Pulse:  63  Resp: 16 13  SpO2: 100% 100%    Last Pain:  Vitals:   11/30/19 1426  TempSrc:   PainSc: 0-No pain                 Barnet Glasgow

## 2019-11-30 NOTE — Telephone Encounter (Signed)
Left message on voicemail with name and callback number Katryn Plummer RN Navigator Cardiac Imaging [facility] [phone] Office [phone] Cell  

## 2019-11-30 NOTE — Discharge Instructions (Signed)

## 2019-12-04 ENCOUNTER — Other Ambulatory Visit (HOSPITAL_COMMUNITY)
Admission: RE | Admit: 2019-12-04 | Discharge: 2019-12-04 | Disposition: A | Discharge status: Home / Self Care | Payer: Medicaid Other | Source: Ambulatory Visit | Attending: Internal Medicine | Admitting: Internal Medicine

## 2019-12-04 ENCOUNTER — Other Ambulatory Visit (HOSPITAL_COMMUNITY): Payer: Medicaid Other

## 2019-12-04 ENCOUNTER — Ambulatory Visit (HOSPITAL_COMMUNITY)
Admission: RE | Admit: 2019-12-04 | Discharge: 2019-12-04 | Disposition: A | Discharge status: Home / Self Care | Payer: Medicaid Other | Source: Ambulatory Visit | Attending: Internal Medicine | Admitting: Internal Medicine

## 2019-12-04 ENCOUNTER — Other Ambulatory Visit: Payer: Self-pay

## 2019-12-04 DIAGNOSIS — Z20822 Contact with and (suspected) exposure to covid-19: Secondary | ICD-10-CM | POA: Insufficient documentation

## 2019-12-04 DIAGNOSIS — I4819 Other persistent atrial fibrillation: Secondary | ICD-10-CM | POA: Insufficient documentation

## 2019-12-04 MED ORDER — IOHEXOL 350 MG/ML SOLN
100.0000 mL | Freq: Once | INTRAVENOUS | Status: AC | PRN
  Administered 2019-12-04: 100 mL via INTRAVENOUS

## 2019-12-05 ENCOUNTER — Other Ambulatory Visit (HOSPITAL_COMMUNITY): Payer: Medicaid Other

## 2019-12-05 LAB — NOVEL CORONAVIRUS, NAA (HOSP ORDER, SEND-OUT TO REF LAB; TAT 18-24 HRS): SARS-CoV-2, NAA: NOT DETECTED

## 2019-12-06 ENCOUNTER — Telehealth: Payer: Self-pay

## 2019-12-06 NOTE — Telephone Encounter (Signed)
Left VM for Pt advising he will have to stay overnight after his procedure if he does not have anyone to stay with him at home.

## 2019-12-07 ENCOUNTER — Other Ambulatory Visit: Payer: Self-pay

## 2019-12-07 ENCOUNTER — Encounter (HOSPITAL_COMMUNITY)
Admission: RE | Disposition: A | Discharge status: Home / Self Care | Payer: Medicaid Other | Source: Home / Self Care | Attending: Internal Medicine

## 2019-12-07 ENCOUNTER — Ambulatory Visit (HOSPITAL_COMMUNITY): Payer: Medicaid Other | Admitting: Anesthesiology

## 2019-12-07 ENCOUNTER — Ambulatory Visit (HOSPITAL_COMMUNITY)
Admission: RE | Admit: 2019-12-07 | Discharge: 2019-12-07 | Disposition: A | Discharge status: Home / Self Care | Payer: Medicaid Other | Attending: Internal Medicine | Admitting: Internal Medicine

## 2019-12-07 DIAGNOSIS — I110 Hypertensive heart disease with heart failure: Secondary | ICD-10-CM | POA: Diagnosis not present

## 2019-12-07 DIAGNOSIS — I428 Other cardiomyopathies: Secondary | ICD-10-CM | POA: Insufficient documentation

## 2019-12-07 DIAGNOSIS — I2510 Atherosclerotic heart disease of native coronary artery without angina pectoris: Secondary | ICD-10-CM | POA: Diagnosis not present

## 2019-12-07 DIAGNOSIS — I5023 Acute on chronic systolic (congestive) heart failure: Secondary | ICD-10-CM | POA: Diagnosis not present

## 2019-12-07 DIAGNOSIS — F17210 Nicotine dependence, cigarettes, uncomplicated: Secondary | ICD-10-CM | POA: Insufficient documentation

## 2019-12-07 DIAGNOSIS — Z7901 Long term (current) use of anticoagulants: Secondary | ICD-10-CM | POA: Diagnosis not present

## 2019-12-07 DIAGNOSIS — I4819 Other persistent atrial fibrillation: Secondary | ICD-10-CM | POA: Insufficient documentation

## 2019-12-07 DIAGNOSIS — Z79899 Other long term (current) drug therapy: Secondary | ICD-10-CM | POA: Insufficient documentation

## 2019-12-07 DIAGNOSIS — Z882 Allergy status to sulfonamides status: Secondary | ICD-10-CM | POA: Diagnosis not present

## 2019-12-07 DIAGNOSIS — I480 Paroxysmal atrial fibrillation: Secondary | ICD-10-CM | POA: Diagnosis not present

## 2019-12-07 DIAGNOSIS — I509 Heart failure, unspecified: Secondary | ICD-10-CM | POA: Diagnosis not present

## 2019-12-07 DIAGNOSIS — R001 Bradycardia, unspecified: Secondary | ICD-10-CM | POA: Diagnosis not present

## 2019-12-07 HISTORY — PX: ATRIAL FIBRILLATION ABLATION: EP1191

## 2019-12-07 LAB — POCT ACTIVATED CLOTTING TIME
Activated Clotting Time: 268 seconds
Activated Clotting Time: 329 seconds

## 2019-12-07 SURGERY — ATRIAL FIBRILLATION ABLATION
Anesthesia: General

## 2019-12-07 MED ORDER — HEPARIN (PORCINE) IN NACL 1000-0.9 UT/500ML-% IV SOLN
INTRAVENOUS | Status: DC | PRN
  Administered 2019-12-07: 500 mL

## 2019-12-07 MED ORDER — LIDOCAINE 2% (20 MG/ML) 5 ML SYRINGE
INTRAMUSCULAR | Status: DC | PRN
  Administered 2019-12-07: 60 mg via INTRAVENOUS

## 2019-12-07 MED ORDER — HEPARIN SODIUM (PORCINE) 1000 UNIT/ML IJ SOLN
INTRAMUSCULAR | Status: AC
  Filled 2019-12-07: qty 1

## 2019-12-07 MED ORDER — PANTOPRAZOLE SODIUM 40 MG PO TBEC: 40.0000 mg | DELAYED_RELEASE_TABLET | Freq: Every day | ORAL | 0 refills | Status: DC

## 2019-12-07 MED ORDER — FENTANYL CITRATE (PF) 250 MCG/5ML IJ SOLN
INTRAMUSCULAR | Status: DC | PRN
  Administered 2019-12-07 (×2): 50 ug via INTRAVENOUS

## 2019-12-07 MED ORDER — SODIUM CHLORIDE 0.9% FLUSH
3.0000 mL | Freq: Two times a day (BID) | INTRAVENOUS | Status: DC
  Administered 2019-12-07: 14:00:00 3 mL via INTRAVENOUS

## 2019-12-07 MED ORDER — PROPOFOL 10 MG/ML IV BOLUS
INTRAVENOUS | Status: DC | PRN
  Administered 2019-12-07: 150 mg via INTRAVENOUS

## 2019-12-07 MED ORDER — HEPARIN (PORCINE) IN NACL 1000-0.9 UT/500ML-% IV SOLN
INTRAVENOUS | Status: AC
  Filled 2019-12-07: qty 500

## 2019-12-07 MED ORDER — SUGAMMADEX SODIUM 200 MG/2ML IV SOLN
INTRAVENOUS | Status: DC | PRN
  Administered 2019-12-07: 200 mg via INTRAVENOUS

## 2019-12-07 MED ORDER — MIDAZOLAM HCL 5 MG/5ML IJ SOLN
INTRAMUSCULAR | Status: DC | PRN
  Administered 2019-12-07: 2 mg via INTRAVENOUS

## 2019-12-07 MED ORDER — PHENYLEPHRINE 40 MCG/ML (10ML) SYRINGE FOR IV PUSH (FOR BLOOD PRESSURE SUPPORT)
PREFILLED_SYRINGE | INTRAVENOUS | Status: DC | PRN
  Administered 2019-12-07: 40 ug via INTRAVENOUS
  Administered 2019-12-07: 120 ug via INTRAVENOUS

## 2019-12-07 MED ORDER — ROCURONIUM BROMIDE 10 MG/ML (PF) SYRINGE
PREFILLED_SYRINGE | INTRAVENOUS | Status: DC | PRN
  Administered 2019-12-07: 20 mg via INTRAVENOUS
  Administered 2019-12-07: 60 mg via INTRAVENOUS
  Administered 2019-12-07: 20 mg via INTRAVENOUS

## 2019-12-07 MED ORDER — SODIUM CHLORIDE 0.9 % IV SOLN: INTRAVENOUS | Status: DC

## 2019-12-07 MED ORDER — HEPARIN SODIUM (PORCINE) 1000 UNIT/ML IJ SOLN
INTRAMUSCULAR | Status: DC | PRN
  Administered 2019-12-07: 14000 [IU] via INTRAVENOUS
  Administered 2019-12-07: 1000 [IU] via INTRAVENOUS

## 2019-12-07 MED ORDER — APIXABAN 5 MG PO TABS
5.0000 mg | ORAL_TABLET | ORAL | Status: DC
  Filled 2019-12-07: qty 1

## 2019-12-07 MED ORDER — HEPARIN SODIUM (PORCINE) 1000 UNIT/ML IJ SOLN
INTRAMUSCULAR | Status: DC | PRN
  Administered 2019-12-07: 5000 [IU] via INTRAVENOUS

## 2019-12-07 MED ORDER — DEXAMETHASONE SODIUM PHOSPHATE 10 MG/ML IJ SOLN
INTRAMUSCULAR | Status: DC | PRN
  Administered 2019-12-07: 8 mg via INTRAVENOUS

## 2019-12-07 MED ORDER — LACTATED RINGERS IV SOLN: INTRAVENOUS | Status: DC | PRN

## 2019-12-07 MED ORDER — ONDANSETRON HCL 4 MG/2ML IJ SOLN
INTRAMUSCULAR | Status: DC | PRN
  Administered 2019-12-07: 4 mg via INTRAVENOUS

## 2019-12-07 MED ORDER — PHENYLEPHRINE HCL-NACL 10-0.9 MG/250ML-% IV SOLN
INTRAVENOUS | Status: DC | PRN
  Administered 2019-12-07: 75 ug/min via INTRAVENOUS

## 2019-12-07 MED ORDER — SODIUM CHLORIDE 0.9 % IV SOLN: 250.0000 mL | INTRAVENOUS | Status: DC | PRN

## 2019-12-07 MED ORDER — PROTAMINE SULFATE 10 MG/ML IV SOLN
INTRAVENOUS | Status: DC | PRN
  Administered 2019-12-07: 10 mg via INTRAVENOUS
  Administered 2019-12-07 (×2): 15 mg via INTRAVENOUS

## 2019-12-07 SURGICAL SUPPLY — 19 items
BLANKET WARM UNDERBOD FULL ACC (MISCELLANEOUS) ×3 IMPLANT
CATH MAPPNG PENTARAY F 2-6-2MM (CATHETERS) ×1 IMPLANT
CATH SMTCH THERMOCOOL SF DF (CATHETERS) ×3 IMPLANT
CATH SOUNDSTAR ECO 8FR (CATHETERS) ×3 IMPLANT
CATH WEBSTER BI DIR CS D-F CRV (CATHETERS) ×3 IMPLANT
COVER SWIFTLINK CONNECTOR (BAG) ×3 IMPLANT
DEVICE CLOSURE PERCLS PRGLD 6F (VASCULAR PRODUCTS) ×3 IMPLANT
NEEDLE BAYLIS TRANSSEPTAL 71CM (NEEDLE) ×3 IMPLANT
PACK EP LATEX FREE (CUSTOM PROCEDURE TRAY) ×2
PACK EP LF (CUSTOM PROCEDURE TRAY) ×1 IMPLANT
PAD PRO RADIOLUCENT 2001M-C (PAD) ×3 IMPLANT
PATCH CARTO3 (PAD) ×3 IMPLANT
PENTARAY F 2-6-2MM (CATHETERS) ×3
PERCLOSE PROGLIDE 6F (VASCULAR PRODUCTS) ×9
SHEATH PINNACLE 7F 10CM (SHEATH) ×6 IMPLANT
SHEATH PINNACLE 9F 10CM (SHEATH) ×3 IMPLANT
SHEATH PROBE COVER 6X72 (BAG) ×3 IMPLANT
SHEATH SWARTZ TS SL2 63CM 8.5F (SHEATH) ×3 IMPLANT
TUBING SMART ABLATE COOLFLOW (TUBING) ×3 IMPLANT

## 2019-12-07 NOTE — Anesthesia Preprocedure Evaluation (Addendum)
Anesthesia Evaluation  Patient identified by MRN, date of birth, ID band Patient awake    Reviewed: Allergy & Precautions, NPO status , Patient's Chart, lab work & pertinent test results  History of Anesthesia Complications Negative for: history of anesthetic complications  Airway Mallampati: I  TM Distance: >3 FB Neck ROM: Full    Dental  (+) Dental Advisory Given, Chipped   Pulmonary Current Smoker and Patient abstained from smoking.,    Pulmonary exam normal        Cardiovascular hypertension, Pt. on medications and Pt. on home beta blockers (-) angina+ CAD and +CHF  + dysrhythmias Atrial Fibrillation  Rhythm:Irregular Rate:Bradycardia   '20 Cath - 1. Mildly elevated PCWP with normal RA pressure.   2. Low cardiac output.  3. Extensive CAD but it does not explain his cardiomyopathy.  4. Calcified 40% proximal LAD stenosis. Large D1, 30% proximal stenosis. 5. 60% stenosis mid LCx after PLOM. 40% stenosis proximal PLOM. 6. 30% stenosis proximal RCA, 50% stenosis distal RCA at bifurcation of PLV and PDA.  '20 TTE - EF 30-35%. Left ventricular diffuse hypokinesis. RV has mildly reduced systolic function. RA was mild-moderately dilated. Mild MR.Trivial TR and PR.      Neuro/Psych negative neurological ROS  negative psych ROS   GI/Hepatic negative GI ROS, Neg liver ROS,   Endo/Other  negative endocrine ROS  Renal/GU Renal InsufficiencyRenal disease     Musculoskeletal  (+) Arthritis ,   Abdominal   Peds  Hematology negative hematology ROS (+)   Anesthesia Other Findings Covid neg 1/4   Reproductive/Obstetrics                           Anesthesia Physical Anesthesia Plan  ASA: III  Anesthesia Plan: General   Post-op Pain Management:    Induction: Intravenous  PONV Risk Score and Plan: 2 and Treatment may vary due to age or medical condition, Ondansetron and  Dexamethasone  Airway Management Planned: Oral ETT  Additional Equipment: None  Intra-op Plan:   Post-operative Plan: Extubation in OR  Informed Consent: I have reviewed the patients History and Physical, chart, labs and discussed the procedure including the risks, benefits and alternatives for the proposed anesthesia with the patient or authorized representative who has indicated his/her understanding and acceptance.     Dental advisory given  Plan Discussed with: CRNA and Anesthesiologist  Anesthesia Plan Comments:        Anesthesia Quick Evaluation

## 2019-12-07 NOTE — Anesthesia Procedure Notes (Addendum)
Procedure Name: Intubation Date/Time: 12/07/2019 11:12 AM Performed by: Audry Pili, MD Pre-anesthesia Checklist: Patient identified, Emergency Drugs available, Suction available and Patient being monitored Patient Re-evaluated:Patient Re-evaluated prior to induction Oxygen Delivery Method: Circle system utilized Preoxygenation: Pre-oxygenation with 100% oxygen Induction Type: IV induction Ventilation: Mask ventilation without difficulty and Oral airway inserted - appropriate to patient size Laryngoscope Size: Miller and 3 Grade View: Grade I Tube type: Oral Tube size: 8.0 mm Number of attempts: 2 (First attempt by CRNA with Mac 4, grade 2B view, unable to pass ETT. 2nd look by Dr. Fransisco Beau with Mil 3, grade 1 view.) Airway Equipment and Method: Stylet and Oral airway Placement Confirmation: ETT inserted through vocal cords under direct vision,  positive ETCO2 and breath sounds checked- equal and bilateral Secured at: 23 cm Tube secured with: Tape Dental Injury: Teeth and Oropharynx as per pre-operative assessment

## 2019-12-07 NOTE — Anesthesia Postprocedure Evaluation (Signed)
Anesthesia Post Note  Patient: Sean Barnett  Procedure(s) Performed: ATRIAL FIBRILLATION ABLATION (N/A )     Patient location during evaluation: PACU Anesthesia Type: General Level of consciousness: awake and alert Pain management: pain level controlled Vital Signs Assessment: post-procedure vital signs reviewed and stable Respiratory status: spontaneous breathing, nonlabored ventilation and respiratory function stable Cardiovascular status: blood pressure returned to baseline and stable Postop Assessment: no apparent nausea or vomiting Anesthetic complications: no    Last Vitals:  Vitals:   12/07/19 0827 12/07/19 1353  BP: (!) 135/92 117/67  Pulse: (!) 56 (!) 57  Resp: 18 10  Temp: 36.5 C (!) 36.2 C  SpO2: 97%     Last Pain:  Vitals:   12/07/19 1353  TempSrc: Temporal  PainSc: 0-No pain                 Beryle Lathe

## 2019-12-07 NOTE — Progress Notes (Signed)
Ambulated in hallway and ambulated to bathroom to void. tol well no bleeding noted from groin site before or after ambulation.

## 2019-12-07 NOTE — H&P (Signed)
Chief Complaint:  afib  History of Present Illness:    Sean Barnett is a 69 y.o. male who presents today for afib ablation.    He presented with bursitis in April of this year.  He was noted to be in afib.  He had CHF with EF 30%.  He was placed on coreg and eliquis.  He underwent cardioversion  08/2019.  He feels well with sinus but has significant bradycardia which limits medical therapy.  His RVR and palpitations resolved with cardioversion.  In retrospect, he thinks that he was in afib since this past December or January based on symptoms.  Today, he denies symptoms of  chest pain, shortness of breath, orthopnea, PND, lower extremity edema, claudication, dizziness, presyncope, syncope, bleeding, or neurologic sequela. The patient is tolerating medications without difficulties and is otherwise without complaint today.   he denies symptoms of cough, fevers, chills, or new SOB worrisome for COVID 19.       Past Medical History:  Diagnosis Date  . Bursitis of elbow 03/2019   left elbow  . DDD (degenerative disc disease), lumbar   . Dyspnea 03/30/2019  . Insomnia 03/30/2019  . Nonischemic cardiomyopathy ([place])   . Nonobstructive CAD   . Persistent atrial fibrillation ([place])   . Septic bursitis of elbow, left 04/13/2019         Past Surgical History:  Procedure Laterality Date  . CARDIOVERSION N/A 09/27/2019   Procedure: CARDIOVERSION;  Surgeon: Larey Dresser, MD;  Location: [facility] ENDOSCOPY;  Service: Cardiovascular;  Laterality: N/A;  . DENTAL EXAMINATION UNDER ANESTHESIA    . RIGHT/LEFT HEART CATH AND CORONARY ANGIOGRAPHY N/A 09/06/2019   Procedure: RIGHT/LEFT HEART CATH AND CORONARY ANGIOGRAPHY;  Surgeon: Larey Dresser, MD;  Location: [facility] CV LAB;  Service: Cardiovascular;  Laterality: N/A;          Current Outpatient Medications  Medication Sig Dispense Refill  . apixaban (ELIQUIS) 5 MG TABS tablet Take 1 tablet (5 mg total) by mouth 2 (two) times daily.  60 tablet 3  . carvedilol (COREG) 25 MG tablet Take 0.5 tablets (12.5 mg total) by mouth 2 (two) times daily with a meal. 90 tablet 3  . digoxin (LANOXIN) 0.125 MG tablet TAKE 1 TABLET BY MOUTH EVERY DAY 90 tablet 4  . furosemide (LASIX) 20 MG tablet Take 1 tablet (20 mg total) by mouth every other day. 15 tablet 11  . rosuvastatin (CRESTOR) 10 MG tablet Take 1 tablet (10 mg total) by mouth daily. 90 tablet 1  . sacubitril-valsartan (ENTRESTO) 49-51 MG Take 1 tablet by mouth 2 (two) times daily. 60 tablet 6  . spironolactone (ALDACTONE) 25 MG tablet Take 0.5 tablets (12.5 mg total) by mouth daily. 45 tablet 3   No current facility-administered medications for this visit.     Allergies:   Sulfa antibiotics   Social History:  The patient  reports that he has been smoking cigarettes. He has a 50.00 pack-year smoking history. He has never used smokeless tobacco. He reports current alcohol use. He reports that he does not use drugs.   Family History:  The patient's family history includes Lung cancer in his father.    ROS:  Please see the history of present illness.   All other systems are personally reviewed and negative.   Physical Exam: Vitals:   12/07/19 0827  BP: (!) 135/92  Pulse: (!) 56  Resp: 18  Temp: 97.7 F (36.5 C)  TempSrc: Skin  SpO2: 97%  Weight:  102.1 kg  Height: 6\' 4"  (1.93 m)   GEN- The patient is well appearing, alert and oriented x 3 today.   Head- normocephalic, atraumatic Eyes-  Sclera clear, conjunctiva pink Ears- hearing intact Oropharynx- clear Neck- supple, Lungs- normal work of breathing Heart- irregular rate and rhythm  GI- soft, NT, ND, + BS MS- no significant deformity or atrophy Skin- no rash or lesion Psych- euthymic mood, full affect Neuro- strength and sensation are intact     Labs/Other Tests and Data Reviewed:    Recent Labs: 03/24/2019: ALT 30; TSH 2.450 08/29/2019: B Natriuretic Peptide 737.2 09/25/2019: Hemoglobin  15.2; Platelets 280 10/02/2019: BUN 15; Creatinine, Ser 1.08; Potassium 4.0; Sodium 140      Wt Readings from Last 3 Encounters:  10/13/19 223 lb 12.8 oz (101.5 kg)  10/04/19 235 lb (106.6 kg)  09/27/19 228 lb (103.4 kg)     Other studies personally reviewed: Additional studies/ records that were reviewed today include: prior echo, Dr 09/29/19 notes Review of the above records today demonstrates: as above    ASSESSMENT & PLAN:    1.  Atrial fibrillation The patient has symptomatic, recurrent persistent atrial fibrillation. Medical therapy is limited by CHF and bradycardia Chads2vasc score is 3.  he is anticoagulated with eliquis . Risk, benefits, and alternatives to EP study and radiofrequency ablation for afib were also discussed in detail today. These risks include but are not limited to stroke, bleeding, vascular damage, tamponade, perforation, damage to the esophagus, lungs, and other structures, pulmonary vein stenosis, worsening renal function, and death. The patient understands these risk and wishes to proceed.    He reports compliance with eliquis without interruption. Cardiac CT was reviewed in detail with him today.  Kathlyn Sacramento MD, [facility] 12/07/2019 10:43 AM

## 2019-12-07 NOTE — Discharge Instructions (Signed)
Cardiac Ablation, Care After This sheet gives you information about how to care for yourself after your procedure. Your health care provider may also give you more specific instructions. If you have problems or questions, contact your health care provider. What can I expect after the procedure? After the procedure, it is common to have:  Bruising around your puncture site.  Tenderness around your puncture site.  Skipped heartbeats.  Tiredness (fatigue). Follow these instructions at home: Puncture site care   Follow instructions from your health care provider about how to take care of your puncture site. Make sure you: ? Wash your hands with soap and water before you change your bandage (dressing). If soap and water are not available, use hand sanitizer. ? Change your dressing as told by your health care provider. ? Leave stitches (sutures), skin glue, or adhesive strips in place. These skin closures may need to stay in place for up to 2 weeks. If adhesive strip edges start to loosen and curl up, you may trim the loose edges. Do not remove adhesive strips completely unless your health care provider tells you to do that.  Check your puncture site every day for signs of infection. Check for: ? Redness, swelling, or pain. ? Fluid or blood. If your puncture site starts to bleed, lie down on your back, apply firm pressure to the area, and contact your health care provider. ? Warmth. ? Pus or a bad smell. Driving  Ask your health care provider when it is safe for you to drive again after the procedure.  Do not drive or use heavy machinery while taking prescription pain medicine.  Do not drive for 24 hours if you were given a medicine to help you relax (sedative) during your procedure. Activity  Avoid activities that take a lot of effort for at least 3 days after your procedure.  Do not lift anything that is heavier than 10 lb (4.5 kg), or the limit that you are told, until your health  care provider says that it is safe.  Return to your normal activities as told by your health care provider. Ask your health care provider what activities are safe for you. General instructions  Take over-the-counter and prescription medicines only as told by your health care provider.  Do not use any products that contain nicotine or tobacco, such as cigarettes and e-cigarettes. If you need help quitting, ask your health care provider.  Do not take baths, swim, or use a hot tub until your health care provider approves.  Do not drink alcohol for 24 hours after your procedure.  Keep all follow-up visits as told by your health care provider. This is important. Contact a health care provider if:  You have redness, mild swelling, or pain around your puncture site.  You have fluid or blood coming from your puncture site that stops after applying firm pressure to the area.  Your puncture site feels warm to the touch.  You have pus or a bad smell coming from your puncture site.  You have a fever.  You have chest pain or discomfort that spreads to your neck, jaw, or arm.  You are sweating a lot.  You feel nauseous.  You have a fast or irregular heartbeat.  You have shortness of breath.  You are dizzy or light-headed and feel the need to lie down.  You have pain or numbness in the arm or leg closest to your puncture site. Get help right away if:  Your puncture   site suddenly swells.  Your puncture site is bleeding and the bleeding does not stop after applying firm pressure to the area. These symptoms may represent a serious problem that is an emergency. Do not wait to see if the symptoms will go away. Get medical help right away. Call your local emergency services (911 in the [place] Do not drive yourself to the hospital. Summary  After the procedure, it is normal to have bruising and tenderness at the puncture site in your groin, neck, or forearm.  Check your puncture site every  day for signs of infection.  Get help right away if your puncture site is bleeding and the bleeding does not stop after applying firm pressure to the area. This is a medical emergency. This information is not intended to replace advice given to you by your health care provider. Make sure you discuss any questions you have with your health care provider. Document Revised: 10/29/2017 Document Reviewed: 02/25/2017 Elsevier Patient Education  2020 [employer]. You have an appointment set up with the Atrial Fibrillation Clinic.  Multiple studies have shown that being followed by a dedicated atrial fibrillation clinic in addition to the standard care you receive from your other physicians improves health. We believe that enrollment in the atrial fibrillation clinic will allow [place] to better care for you.   The phone number to the Atrial Fibrillation Clinic is [phone]. The clinic is staffed Monday through Friday from 8:30am to 5pm.  Parking Directions: The clinic is located in the Heart and Vascular Building connected to [facility]. 1)From [address] turn on to [address] and go to the 3rd entrance  (Heart and Vascular entrance) on the right. 2)Look to the right for Heart &Vascular Parking Garage. 3)A code for the entrance is required please call the clinic to receive this.   4)Take the elevators to the 1st floor. Registration is in the room with the glass walls at the end of the hallway.  If you have any trouble parking or locating the clinic, please don't hesitate to call [phone].  No driving for 4 days. No lifting over 5 lbs for 1 week. No sexual activity for 1 week. You may return to work in 1 week. Keep procedure site clean & dry. If you notice increased pain, swelling, bleeding or pus, call/return!  You may shower, but no soaking baths/hot tubs/pools for 1 week.

## 2019-12-07 NOTE — Transfer of Care (Signed)
Immediate Anesthesia Transfer of Care Note  Patient: Sean Barnett  Procedure(s) Performed: ATRIAL FIBRILLATION ABLATION (N/A )  Patient Location: Cath Lab  Anesthesia Type:General  Level of Consciousness: awake, alert  and oriented  Airway & Oxygen Therapy: Patient Spontanous Breathing and Patient connected to nasal cannula oxygen  Post-op Assessment: Report given to RN, Post -op Vital signs reviewed and stable and Patient moving all extremities X 4  Post vital signs: Reviewed and stable  Last Vitals:  Vitals Value Taken Time  BP    Temp    Pulse    Resp    SpO2      Last Pain:  Vitals:   12/07/19 0838  TempSrc:   PainSc: 0-No pain         Complications: No apparent anesthesia complications

## 2019-12-15 ENCOUNTER — Telehealth (HOSPITAL_COMMUNITY): Payer: Self-pay | Admitting: Emergency Medicine

## 2019-12-15 NOTE — Telephone Encounter (Signed)
Returning phone call regarding upcoming cardiac imaging study; pt verbalizes understanding of appt date/time, parking situation and where to check in, and verified current allergies; name and call back number provided for further questions should they arise Rockwell Alexandria RN Navigator Cardiac Imaging [facility] [phone] office [phone] cell  Pt denies implanted devices, denies claustrophobia, denies possiblity of metal shrapnel.  Pt does inquire about some swelling to ablation catheter insertion site that began on tues this week. Only tender, not painful. Patient just wanted to make Dr. Johney Frame aware. Please call patient back to reassure him or discuss next steps.

## 2019-12-15 NOTE — Telephone Encounter (Signed)
Left message on voicemail with name and callback number Jassmine Vandruff RN Navigator Cardiac Imaging [facility] [phone] Office [phone] Cell  

## 2019-12-17 ENCOUNTER — Encounter (HOSPITAL_COMMUNITY): Payer: Self-pay | Admitting: Emergency Medicine

## 2019-12-17 ENCOUNTER — Other Ambulatory Visit: Payer: Self-pay

## 2019-12-17 ENCOUNTER — Emergency Department (HOSPITAL_BASED_OUTPATIENT_CLINIC_OR_DEPARTMENT_OTHER): Payer: Medicaid Other

## 2019-12-17 ENCOUNTER — Emergency Department (HOSPITAL_COMMUNITY)
Admission: EM | Admit: 2019-12-17 | Discharge: 2019-12-17 | Disposition: A | Discharge status: Home / Self Care | Payer: Medicaid Other | Attending: Emergency Medicine | Admitting: Emergency Medicine

## 2019-12-17 DIAGNOSIS — I110 Hypertensive heart disease with heart failure: Secondary | ICD-10-CM | POA: Insufficient documentation

## 2019-12-17 DIAGNOSIS — F17210 Nicotine dependence, cigarettes, uncomplicated: Secondary | ICD-10-CM | POA: Insufficient documentation

## 2019-12-17 DIAGNOSIS — I5020 Unspecified systolic (congestive) heart failure: Secondary | ICD-10-CM | POA: Diagnosis not present

## 2019-12-17 DIAGNOSIS — R222 Localized swelling, mass and lump, trunk: Secondary | ICD-10-CM | POA: Diagnosis not present

## 2019-12-17 DIAGNOSIS — Z79899 Other long term (current) drug therapy: Secondary | ICD-10-CM | POA: Insufficient documentation

## 2019-12-17 DIAGNOSIS — R1909 Other intra-abdominal and pelvic swelling, mass and lump: Secondary | ICD-10-CM | POA: Diagnosis not present

## 2019-12-17 DIAGNOSIS — Z7901 Long term (current) use of anticoagulants: Secondary | ICD-10-CM | POA: Diagnosis not present

## 2019-12-17 LAB — BASIC METABOLIC PANEL
Anion gap: 10 (ref 5–15)
BUN: 19 mg/dL (ref 8–23)
CO2: 24 mmol/L (ref 22–32)
Calcium: 8.6 mg/dL — ABNORMAL LOW (ref 8.9–10.3)
Chloride: 104 mmol/L (ref 98–111)
Creatinine, Ser: 1.2 mg/dL (ref 0.61–1.24)
GFR calc Af Amer: >60 mL/min (ref >60)
GFR calc non Af Amer: >60 mL/min (ref >60)
Glucose, Bld: 109 mg/dL — ABNORMAL HIGH (ref 70–99)
Potassium: 4.3 mmol/L (ref 3.5–5.1)
Sodium: 138 mmol/L (ref 135–145)

## 2019-12-17 LAB — CBC WITH DIFFERENTIAL/PLATELET
Abs Immature Granulocytes: 0.08 10*3/uL — ABNORMAL HIGH (ref 0.00–0.07)
Basophils Absolute: 0.1 10*3/uL (ref 0.0–0.1)
Basophils Relative: 1 %
Eosinophils Absolute: 0.3 10*3/uL (ref 0.0–0.5)
Eosinophils Relative: 3 %
HCT: 40.7 % (ref 39.0–52.0)
Hemoglobin: 13.3 g/dL (ref 13.0–17.0)
Immature Granulocytes: 1 %
Lymphocytes Relative: 17 %
Lymphs Abs: 2.2 10*3/uL (ref 0.7–4.0)
MCH: 30.6 pg (ref 26.0–34.0)
MCHC: 32.7 g/dL (ref 30.0–36.0)
MCV: 93.8 fL (ref 80.0–100.0)
Monocytes Absolute: 0.8 10*3/uL (ref 0.1–1.0)
Monocytes Relative: 6 %
Neutro Abs: 9.6 10*3/uL — ABNORMAL HIGH (ref 1.7–7.7)
Neutrophils Relative %: 72 %
Platelets: 310 10*3/uL (ref 150–400)
RBC: 4.34 MIL/uL (ref 4.22–5.81)
RDW: 16.1 % — ABNORMAL HIGH (ref 11.5–15.5)
WBC: 13.1 10*3/uL — ABNORMAL HIGH (ref 4.0–10.5)
nRBC: 0 % (ref 0.0–0.2)

## 2019-12-17 LAB — LACTIC ACID, PLASMA: Lactic Acid, Venous: 1.2 mmol/L (ref 0.5–1.9)

## 2019-12-17 MED ORDER — CEPHALEXIN 500 MG PO CAPS: 1000.0000 mg | ORAL_CAPSULE | Freq: Two times a day (BID) | ORAL | 0 refills | Status: DC

## 2019-12-17 NOTE — ED Provider Notes (Signed)
[facility] EMERGENCY DEPARTMENT Provider Note   CSN: [id number] Arrival date & time: 12/17/19  0855     History Chief Complaint  Patient presents with  . Post-op Problem    Sean Barnett is a 69 y.o. male.  HPI Patient presents to the emergency department with swelling that started this past Wednesday.  The patient states that he had a ablation where the inserted the catheter in his right inguinal region.  The patient states that this was done the previous Friday.  The patient states that the swelling seemed to get worse over the week.  The patient states that nothing seems to make the condition better or worse.  Patient states this morning he did notice some bloody drainage from the area.  The patient denies chest pain, shortness of breath, headache,blurred vision, neck pain, fever, cough, weakness, numbness, dizziness, anorexia, edema, abdominal pain, nausea, vomiting, diarrhea, rash, back pain, dysuria, hematemesis, bloody stool, near syncope, or syncope.    Past Medical History:  Diagnosis Date  . Bursitis of elbow 03/2019   left elbow  . DDD (degenerative disc disease), lumbar   . Dyspnea 03/30/2019  . Insomnia 03/30/2019  . Nonischemic cardiomyopathy ([place])   . Nonobstructive CAD   . Persistent atrial fibrillation ([place])   . Septic bursitis of elbow, left 04/13/2019    Patient Active Problem List   Diagnosis Date Noted  . Acute on chronic systolic (congestive) heart failure ([place]) 09/06/2019  . Healthcare maintenance 07/11/2019  . Tobacco use 07/11/2019  . Hypertension 04/13/2019  . Systolic heart failure ([place]) 03/31/2019  . Atrial fibrillation with RVR ([place]) 03/24/2019    Past Surgical History:  Procedure Laterality Date  . ATRIAL FIBRILLATION ABLATION N/A 12/07/2019   Procedure: ATRIAL FIBRILLATION ABLATION;  Surgeon: Thompson Grayer, MD;  Location: [facility] CV LAB;  Service: Cardiovascular;  Laterality: N/A;  . CARDIOVERSION N/A 09/27/2019   Procedure: CARDIOVERSION;  Surgeon: Larey Dresser, MD;  Location: [facility] ENDOSCOPY;  Service: Cardiovascular;  Laterality: N/A;  . CARDIOVERSION N/A 11/30/2019   Procedure: CARDIOVERSION;  Surgeon: Larey Dresser, MD;  Location: [facility] ENDOSCOPY;  Service: Cardiovascular;  Laterality: N/A;  . DENTAL EXAMINATION UNDER ANESTHESIA    . RIGHT/LEFT HEART CATH AND CORONARY ANGIOGRAPHY N/A 09/06/2019   Procedure: RIGHT/LEFT HEART CATH AND CORONARY ANGIOGRAPHY;  Surgeon: Larey Dresser, MD;  Location: [facility] CV LAB;  Service: Cardiovascular;  Laterality: N/A;       Family History  Problem Relation Age of Onset  . Lung cancer Father     Social History   Tobacco Use  . Smoking status: Current Every Day Smoker    Packs/day: 1.00    Years: 50.00    Pack years: 50.00    Types: Cigarettes  . Smokeless tobacco: Never Used  . Tobacco comment: trying to "cut down"  Substance Use Topics  . Alcohol use: Yes    Comment: rare  . Drug use: Never    Home Medications Prior to Admission medications   Medication Sig Start Date End Date Taking? Authorizing Provider  amiodarone (PACERONE) 200 MG tablet Take 1 tablet (200 mg total) by mouth daily. 11/30/19  Yes Larey Dresser, MD  carvedilol (COREG) 25 MG tablet Take 0.5 tablets (12.5 mg total) by mouth 2 (two) times daily with a meal. 09/27/19  Yes Larey Dresser, MD  ELIQUIS 5 MG TABS tablet TAKE 1 TABLET BY MOUTH TWICE A DAY Patient taking differently: Take 5 mg by mouth 2 (two)  times daily.  10/30/19  Yes Theotis Barrio, MD  pantoprazole (PROTONIX) 40 MG tablet Take 1 tablet (40 mg total) by mouth daily. 12/07/19 01/21/20 Yes Allred, Fayrene Fearing, MD  rosuvastatin (CRESTOR) 10 MG tablet Take 1 tablet (10 mg total) by mouth daily. 10/05/19 01/03/20 Yes Laurey Morale, MD  sacubitril-valsartan (ENTRESTO) 49-51 MG Take 1 tablet by mouth 2 (two) times daily. 10/04/19  Yes Laurey Morale, MD  spironolactone (ALDACTONE) 25 MG tablet Take 0.5 tablets (12.5 mg  total) by mouth every evening. 11/09/19 02/07/20 Yes Laurey Morale, MD  digoxin (LANOXIN) 0.125 MG tablet TAKE 1 TABLET BY MOUTH EVERY DAY Patient not taking: Reported on 12/04/2019 10/02/19   Laurey Morale, MD    Allergies    Sulfa antibiotics  Review of Systems   Review of Systems All other systems negative except as documented in the HPI. All pertinent positives and negatives as reviewed in the HPI. Physical Exam Updated Vital Signs BP 109/67 (BP Location: Right Arm)   Pulse (!) 57   Temp 98.4 F (36.9 C) (Oral)   Resp 18   SpO2 98%   Physical Exam Vitals and nursing note reviewed.  Constitutional:      General: He is not in acute distress.    Appearance: He is well-developed.  HENT:     Head: Normocephalic and atraumatic.  Eyes:     Pupils: Pupils are equal, round, and reactive to light.  Cardiovascular:     Rate and Rhythm: Normal rate and regular rhythm.     Heart sounds: Normal heart sounds. No murmur. No friction rub. No gallop.   Pulmonary:     Effort: Pulmonary effort is normal. No respiratory distress.     Breath sounds: Normal breath sounds. No wheezing.  Abdominal:     General: Bowel sounds are normal. There is no distension.     Palpations: Abdomen is soft.     Tenderness: There is no abdominal tenderness.  Musculoskeletal:     Cervical back: Normal range of motion and neck supple.  Skin:    General: Skin is warm and dry.     Capillary Refill: Capillary refill takes less than 2 seconds.     Findings: No erythema or rash.       Neurological:     Mental Status: He is alert and oriented to person, place, and time.     Motor: No abnormal muscle tone.     Coordination: Coordination normal.  Psychiatric:        Behavior: Behavior normal.     ED Results / Procedures / Treatments   Labs (all labs ordered are listed, but only abnormal results are displayed) Labs Reviewed  CBC WITH DIFFERENTIAL/PLATELET - Abnormal; Notable for the following  components:      Result Value   WBC 13.1 (*)    RDW 16.1 (*)    Neutro Abs 9.6 (*)    Abs Immature Granulocytes 0.08 (*)    All other components within normal limits  BASIC METABOLIC PANEL - Abnormal; Notable for the following components:   Glucose, Bld 109 (*)    Calcium 8.6 (*)    All other components within normal limits  LACTIC ACID, PLASMA    EKG None  Radiology VAS [place] GROIN PSEUDOANEURYSM  Result Date: 12/17/2019  ARTERIAL PSEUDOANEURYSM  Exam: Right groin Indications: Patient complains of palpable knot and oozing noted at stick site. History: Status post ablation 12/07/19, patient on Eliquis. Comparison Study: No prior study  Performing Technologist: Sherren Kerns RVS  Examination Guidelines: A complete evaluation includes B-mode imaging, spectral Doppler, color Doppler, and power Doppler as needed of all accessible portions of each vessel. Bilateral testing is considered an integral part of a complete examination. Limited examinations for reoccurring indications may be performed as noted.  Summary: No evidence of pseudoaneurysm in the right groin. Enlarged lymph node noted.    --------------------------------------------------------------------------------    Preliminary     Procedures Procedures (including critical care time)  Medications Ordered in ED Medications - No data to display  ED Course  I have reviewed the triage vital signs and the nursing notes.  Pertinent labs & imaging results that were available during my care of the patient were reviewed by me and considered in my medical decision making (see chart for details).  I spoke with cardiology MDM Rules/Calculators/A&P                     Final Clinical Impression(s) / ED Diagnoses Final diagnoses:  None  I spoke with cardiology in reference to this patient.  He felt that the patient needs to be started on Keflex and will need to closely follow with his doctor who he saw for the ablation.  There is concerned  that this could be infectious and told the patient to return here for any worsening in his condition.  Rx / DC Orders ED Discharge Orders    None       Charlestine Night, Cordelia Poche 12/17/19 1418    Linwood Dibbles, MD 12/18/19 [phone]

## 2019-12-17 NOTE — ED Triage Notes (Signed)
Pt reports he had cardiac ablation on 1/7, this week he began to noticed pain and swelling to groin site. States when he got up to urinate this am he noticed that site was bleeding. currently on eloquis.

## 2019-12-17 NOTE — Discharge Instructions (Signed)
Return here as needed.  Call your cardiologist for same tomorrow for reevaluation of this area of swelling.

## 2019-12-17 NOTE — Progress Notes (Signed)
VASCULAR LAB PRELIMINARY  PRELIMINARY  PRELIMINARY  PRELIMINARY  Right groin ultrasound completed.    Preliminary report:  See CV proc for preliminary results.  Gave Ebbie Ridge, PA-C results.   Anadia Helmes, RVT 12/17/2019, 11:48 AM

## 2019-12-18 ENCOUNTER — Ambulatory Visit (HOSPITAL_COMMUNITY)
Admission: RE | Admit: 2019-12-18 | Discharge: 2019-12-18 | Disposition: A | Discharge status: Home / Self Care | Payer: Medicaid Other | Source: Ambulatory Visit | Attending: Cardiology | Admitting: Cardiology

## 2019-12-18 DIAGNOSIS — I5022 Chronic systolic (congestive) heart failure: Secondary | ICD-10-CM | POA: Insufficient documentation

## 2019-12-18 MED ORDER — GADOBUTROL 1 MMOL/ML IV SOLN
10.0000 mL | Freq: Once | INTRAVENOUS | Status: AC | PRN
  Administered 2019-12-18: 10 mL via INTRAVENOUS

## 2019-12-19 ENCOUNTER — Telehealth (HOSPITAL_COMMUNITY): Payer: Self-pay | Admitting: Physician Assistant

## 2019-12-19 NOTE — Telephone Encounter (Signed)
Called and left message for patient to call back.  Need to schedule f/u with Jorja Loa, PA d/t groin tenderness.

## 2019-12-19 NOTE — Telephone Encounter (Signed)
Spoke with pt, he is aware of appt 12/20/19 @9 :30 with , PA.  Appt for 12/26/19 with 12/28/19 cancelled.

## 2019-12-19 NOTE — Telephone Encounter (Signed)
Per review of Pt chart, Pt went to ER for groin evaluation and started on antibiotic with f/u scheduled.  Advised Dr. Johney Frame.

## 2019-12-20 ENCOUNTER — Encounter (HOSPITAL_COMMUNITY): Payer: Self-pay | Admitting: Physician Assistant

## 2019-12-20 ENCOUNTER — Ambulatory Visit (HOSPITAL_COMMUNITY)
Admission: RE | Admit: 2019-12-20 | Discharge: 2019-12-20 | Disposition: A | Discharge status: Home / Self Care | Payer: Medicaid Other | Source: Ambulatory Visit | Attending: Physician Assistant | Admitting: Physician Assistant

## 2019-12-20 ENCOUNTER — Other Ambulatory Visit: Payer: Self-pay

## 2019-12-20 VITALS — BP 126/76 | HR 48 | Ht 76.0 in | Wt 236.6 lb

## 2019-12-20 DIAGNOSIS — D6869 Other thrombophilia: Secondary | ICD-10-CM

## 2019-12-20 DIAGNOSIS — Z7901 Long term (current) use of anticoagulants: Secondary | ICD-10-CM | POA: Insufficient documentation

## 2019-12-20 DIAGNOSIS — S301XXA Contusion of abdominal wall, initial encounter: Secondary | ICD-10-CM | POA: Diagnosis not present

## 2019-12-20 DIAGNOSIS — I2510 Atherosclerotic heart disease of native coronary artery without angina pectoris: Secondary | ICD-10-CM | POA: Insufficient documentation

## 2019-12-20 DIAGNOSIS — Z79899 Other long term (current) drug therapy: Secondary | ICD-10-CM | POA: Diagnosis not present

## 2019-12-20 DIAGNOSIS — I4819 Other persistent atrial fibrillation: Secondary | ICD-10-CM | POA: Insufficient documentation

## 2019-12-20 DIAGNOSIS — M5136 Other intervertebral disc degeneration, lumbar region: Secondary | ICD-10-CM | POA: Insufficient documentation

## 2019-12-20 DIAGNOSIS — I110 Hypertensive heart disease with heart failure: Secondary | ICD-10-CM | POA: Insufficient documentation

## 2019-12-20 DIAGNOSIS — I5022 Chronic systolic (congestive) heart failure: Secondary | ICD-10-CM | POA: Diagnosis not present

## 2019-12-20 DIAGNOSIS — F17210 Nicotine dependence, cigarettes, uncomplicated: Secondary | ICD-10-CM | POA: Diagnosis not present

## 2019-12-20 NOTE — Progress Notes (Addendum)
Primary Care Physician: Theotis Barrio, MD Primary Cardiologist: Dr Shirlee Latch Primary Electrophysiologist: Dr Johney Frame Referring Physician: Dr Johney Frame   Sean Barnett is a 69 y.o. male with a history of HTN, tobacco abuse, chronic systolic CHF, persistent atrial fibrillation who presents for follow up in the [facility].  The patient was initially diagnosed with atrial fibrillation 03/2019 in the setting of bursitis. Echo was done, showing EF depressed to 30-35%.  He was started on Coreg and it was titrated up to control his HR.  He was started on Eliquis for a CHADS2VASC score of 3. In 10/20, he had LHC/RHC showing moderate nonobstructive CAD. He was started on digoxin.  Three weeks later after restarting apixaban he had had successful DCCV to NSR. Unfortunately, he had recurrence of afib and underwent DCCV on 11/30/19 after loading on amiodarone which was unsuccessful. He is now s/p afib ablation with Dr Johney Frame on 1/721. Patient reports that he began having swelling and bloody discharge several days after his ablation. He presented to the ER and was started on Keflex. Ultrasounds was negative for pseudoaneurysm. Today, patient reports that he feels better with less swelling and minimal drainage from the cath site. He denies heart racing, palpitations, CP, or swallowing difficulties.   Today, he denies symptoms of palpitations, chest pain, shortness of breath, orthopnea, PND, lower extremity edema, dizziness, presyncope, syncope, snoring, daytime somnolence, bleeding, or neurologic sequela. The patient is tolerating medications without difficulties and is otherwise without complaint today.    Atrial Fibrillation Risk Factors:  he does not have symptoms or diagnosis of sleep apnea. he does not have a history of rheumatic fever.   he has a BMI of Body mass index is 28.8 kg/m.Marland Kitchen Filed Weights   12/20/19 0924  Weight: 107.3 kg    Family History  Problem Relation Age of  Onset  . Lung cancer Father      Atrial Fibrillation Management history:  Previous antiarrhythmic drugs: amiodarone Previous cardioversions: 08/2019, 10/2019 Previous ablations: 12/07/19 CHADS2VASC score: 3 Anticoagulation history: Eliquis   Past Medical History:  Diagnosis Date  . Bursitis of elbow 03/2019   left elbow  . DDD (degenerative disc disease), lumbar   . Dyspnea 03/30/2019  . Insomnia 03/30/2019  . Nonischemic cardiomyopathy (HCC)   . Nonobstructive CAD   . Persistent atrial fibrillation (HCC)   . Septic bursitis of elbow, left 04/13/2019   Past Surgical History:  Procedure Laterality Date  . ATRIAL FIBRILLATION ABLATION N/A 12/07/2019   Procedure: ATRIAL FIBRILLATION ABLATION;  Surgeon: Hillis Range, MD;  Location: [facility] INVASIVE CV LAB;  Service: Cardiovascular;  Laterality: N/A;  . CARDIOVERSION N/A 09/27/2019   Procedure: CARDIOVERSION;  Surgeon: Laurey Morale, MD;  Location: [facility] ENDOSCOPY;  Service: Cardiovascular;  Laterality: N/A;  . CARDIOVERSION N/A 11/30/2019   Procedure: CARDIOVERSION;  Surgeon: Laurey Morale, MD;  Location: [facility] ENDOSCOPY;  Service: Cardiovascular;  Laterality: N/A;  . DENTAL EXAMINATION UNDER ANESTHESIA    . RIGHT/LEFT HEART CATH AND CORONARY ANGIOGRAPHY N/A 09/06/2019   Procedure: RIGHT/LEFT HEART CATH AND CORONARY ANGIOGRAPHY;  Surgeon: Laurey Morale, MD;  Location: [facility] INVASIVE CV LAB;  Service: Cardiovascular;  Laterality: N/A;    Current Outpatient Medications  Medication Sig Dispense Refill  . amiodarone (PACERONE) 200 MG tablet Take 1 tablet (200 mg total) by mouth daily. 30 tablet 5  . carvedilol (COREG) 25 MG tablet Take 0.5 tablets (12.5 mg total) by mouth 2 (two) times daily with a meal. 90  tablet 3  . cephALEXin (KEFLEX) 500 MG capsule Take 2 capsules (1,000 mg total) by mouth 2 (two) times daily. 28 capsule 0  . ELIQUIS 5 MG TABS tablet TAKE 1 TABLET BY MOUTH TWICE A DAY (Patient taking differently: Take 5 mg by mouth 2  (two) times daily. ) 60 tablet 3  . pantoprazole (PROTONIX) 40 MG tablet Take 1 tablet (40 mg total) by mouth daily. 45 tablet 0  . rosuvastatin (CRESTOR) 10 MG tablet Take 1 tablet (10 mg total) by mouth daily. 90 tablet 1  . sacubitril-valsartan (ENTRESTO) 49-51 MG Take 1 tablet by mouth 2 (two) times daily. 60 tablet 6  . spironolactone (ALDACTONE) 25 MG tablet Take 0.5 tablets (12.5 mg total) by mouth every evening. 45 tablet 3   No current facility-administered medications for this encounter.    Allergies  Allergen Reactions  . Sulfa Antibiotics Swelling    "like a balloon"    Social History   Socioeconomic History  . Marital status: Widowed    Spouse name: Not on file  . Number of children: Not on file  . Years of education: Not on file  . Highest education level: Not on file  Occupational History  . Not on file  Tobacco Use  . Smoking status: Current Every Day Smoker    Packs/day: 1.00    Years: 50.00    Pack years: 50.00    Types: Cigarettes  . Smokeless tobacco: Never Used  . Tobacco comment: trying to "cut down"  Substance and Sexual Activity  . Alcohol use: Yes    Alcohol/week: 2.0 standard drinks    Types: 1 Cans of beer, 1 Standard drinks or equivalent per week    Comment: rare  . Drug use: Never  . Sexual activity: Not on file  Other Topics Concern  . 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 Difficulty of Paying Living Expenses: Not on file  Food Insecurity:   . Worried About Charity fundraiser in the Last Year: Not on file  . Ran Out of Food in the Last Year: Not on file  Transportation Needs:   . Lack of Transportation (Medical): Not on file  . Lack of Transportation (Non-Medical): Not on file  Physical Activity:   . Days of Exercise per Week: Not on file  . Minutes of Exercise per Session: Not on file  Stress:   . Feeling  of Stress : Not on file  Social Connections:   . Frequency of Communication with Friends and Family: Not on file  . Frequency of Social Gatherings with Friends and Family: Not on file  . Attends Religious Services: Not on file  . Active Member of Clubs or Organizations: Not on file  . Attends Archivist Meetings: Not on file  . Marital Status: Not on file  Intimate Partner Violence:   . Fear of Current or Ex-Partner: Not on file  . Emotionally Abused: Not on file  . Physically Abused: Not on file  . Sexually Abused: Not on file     ROS- All systems are reviewed and negative except as per the HPI above.  Physical Exam: Vitals:   12/20/19 0924  BP: 126/76  Pulse: (!) 48  Weight: 107.3 kg  Height: 6\' 4"  (1.93 m)    GEN- The patient is well appearing, alert and oriented  x 3 today.   Head- normocephalic, atraumatic Eyes-  Sclera clear, conjunctiva pink Ears- hearing intact Oropharynx- clear Neck- supple  Lungs- Clear to ausculation bilaterally, normal work of breathing Heart- Regular rhythm, bradycardia, no murmurs, rubs or gallops  GI- soft, NT, ND, + BS Extremities- no clubbing, cyanosis, or edema. R groin swollen, mild erythema.  MS- no significant deformity or atrophy Skin- no rash or lesion Psych- euthymic mood, full affect Neuro- strength and sensation are intact  Wt Readings from Last 3 Encounters:  12/20/19 107.3 kg  12/07/19 102.1 kg  11/30/19 103 kg    EKG today demonstrates SB HR 48, LAD, PR 206, QRS 106, QTc 427  Echo 03/25/19 demonstrated  1. The left ventricle has moderate-severely reduced systolic function, with an ejection fraction of 30-35%. The cavity size was normal. Left ventricular diastolic function could not be evaluated secondary to atrial fibrillation. Indeterminate filling  pressures Left ventricular diffuse hypokinesis. Beat to beat variability in LVEF due to atrial fibrillation.  2. The right ventricle has mildly reduced systolic  function. The cavity was normal. There is no increase in right ventricular wall thickness. Right ventricular systolic pressure could not be assessed.  3. The inferior vena cava was dilated in size with <50% respiratory variability.  Epic records are reviewed at length today  CHA2DS2-VASc Score = 3 The patient's score is based upon: CHF History: Yes HTN History: No Age : 67-74 Diabetes History: No Stroke History: No Vascular Disease History: Yes Gender: Male      ASSESSMENT AND PLAN: 1. Persistent Atrial Fibrillation (ICD10:  I48.19) The patient's CHA2DS2-VASc score is 3, indicating a 3.2% annual risk of stroke.   Patient s/p afib ablation with Dr Johney Frame 12/07/19. Appears to be maintaining SR. R Groin hematoma noted and is improving since ER visit. Continue Keflex. Return in one week for groin check.  2. Secondary Hypercoagulable State (ICD10:  D68.69) The patient is at significant risk for stroke/thromboembolism based upon his CHA2DS2-VASc Score of 3.  Continue Apixaban (Eliquis) with no missed doses for at least 3 months post ablation.  3. Chronic systolic CHF NICM No signs or symptoms of fluid overload. Followed by Dr Shirlee Latch.  4. CAD Nonobstructive on cath 08/2019. No anginal symptoms.  5. Groin hematoma Stable, plans as above.   Follow up in the AF clinic in one week for groin check.   Jorja Loa PA-C [facility] [address] [place], [place] [address] [phone] 12/20/2019 10:06 AM

## 2019-12-26 ENCOUNTER — Ambulatory Visit: Payer: Medicaid Other | Admitting: Physician Assistant

## 2019-12-27 ENCOUNTER — Ambulatory Visit (HOSPITAL_COMMUNITY)
Admission: RE | Admit: 2019-12-27 | Discharge: 2019-12-27 | Disposition: A | Discharge status: Home / Self Care | Payer: Medicaid Other | Source: Ambulatory Visit | Attending: Physician Assistant | Admitting: Physician Assistant

## 2019-12-27 ENCOUNTER — Other Ambulatory Visit: Payer: Self-pay

## 2019-12-27 VITALS — BP 102/54 | HR 46 | Ht 76.0 in | Wt 236.8 lb

## 2019-12-27 DIAGNOSIS — I110 Hypertensive heart disease with heart failure: Secondary | ICD-10-CM | POA: Diagnosis not present

## 2019-12-27 DIAGNOSIS — I2510 Atherosclerotic heart disease of native coronary artery without angina pectoris: Secondary | ICD-10-CM | POA: Insufficient documentation

## 2019-12-27 DIAGNOSIS — F17210 Nicotine dependence, cigarettes, uncomplicated: Secondary | ICD-10-CM | POA: Diagnosis not present

## 2019-12-27 DIAGNOSIS — D6869 Other thrombophilia: Secondary | ICD-10-CM | POA: Diagnosis not present

## 2019-12-27 DIAGNOSIS — I5022 Chronic systolic (congestive) heart failure: Secondary | ICD-10-CM | POA: Diagnosis not present

## 2019-12-27 DIAGNOSIS — Z7901 Long term (current) use of anticoagulants: Secondary | ICD-10-CM | POA: Insufficient documentation

## 2019-12-27 DIAGNOSIS — Z825 Family history of asthma and other chronic lower respiratory diseases: Secondary | ICD-10-CM | POA: Insufficient documentation

## 2019-12-27 DIAGNOSIS — I4819 Other persistent atrial fibrillation: Secondary | ICD-10-CM | POA: Insufficient documentation

## 2019-12-27 DIAGNOSIS — Z882 Allergy status to sulfonamides status: Secondary | ICD-10-CM | POA: Diagnosis not present

## 2019-12-27 DIAGNOSIS — Z79899 Other long term (current) drug therapy: Secondary | ICD-10-CM | POA: Insufficient documentation

## 2019-12-27 NOTE — Progress Notes (Signed)
Primary Care Physician: Theotis Barrio, MD Primary Cardiologist: Dr Shirlee Latch Primary Electrophysiologist: Dr Johney Frame Referring Physician: Dr Johney Frame   Sean Barnett is a 69 y.o. male with a history of HTN, tobacco abuse, chronic systolic CHF, persistent atrial fibrillation who presents for follow up in the [facility].  The patient was initially diagnosed with atrial fibrillation 03/2019 in the setting of bursitis. Echo was done, showing EF depressed to 30-35%.  He was started on Coreg and it was titrated up to control his HR.  He was started on Eliquis for a CHADS2VASC score of 3. In 10/20, he had LHC/RHC showing moderate nonobstructive CAD. He was started on digoxin.  Three weeks later after restarting apixaban he had had successful DCCV to NSR. Unfortunately, he had recurrence of afib and underwent DCCV on 11/30/19 after loading on amiodarone which was unsuccessful. He is now s/p afib ablation with Dr Johney Frame on 1/721. Patient reports that he began having swelling and bloody discharge several days after his ablation. He presented to the ER and was started on Keflex. Ultrasounds was negative for pseudoaneurysm.   On follow up today, patient reports that he is doing very well. His hematoma is much smaller and no longer tender. He completed his course of Keflex. He has noted no drainage from the site for several days. He denies any heart racing or palpitations.   Today, he denies symptoms of palpitations, chest pain, shortness of breath, orthopnea, PND, lower extremity edema, dizziness, presyncope, syncope, snoring, daytime somnolence, bleeding, or neurologic sequela. The patient is tolerating medications without difficulties and is otherwise without complaint today.    Atrial Fibrillation Risk Factors:  he does not have symptoms or diagnosis of sleep apnea. he does not have a history of rheumatic fever.   he has a BMI of Body mass index is 28.82 kg/m.Marland Kitchen Filed Weights     12/27/19 1026  Weight: 107.4 kg    Family History  Problem Relation Age of Onset  . Lung cancer Father      Atrial Fibrillation Management history:  Previous antiarrhythmic drugs: amiodarone Previous cardioversions: 08/2019, 10/2019 Previous ablations: 12/07/19 CHADS2VASC score: 3 Anticoagulation history: Eliquis   Past Medical History:  Diagnosis Date  . Bursitis of elbow 03/2019   left elbow  . DDD (degenerative disc disease), lumbar   . Dyspnea 03/30/2019  . Insomnia 03/30/2019  . Nonischemic cardiomyopathy (HCC)   . Nonobstructive CAD   . Persistent atrial fibrillation (HCC)   . Septic bursitis of elbow, left 04/13/2019   Past Surgical History:  Procedure Laterality Date  . ATRIAL FIBRILLATION ABLATION N/A 12/07/2019   Procedure: ATRIAL FIBRILLATION ABLATION;  Surgeon: Hillis Range, MD;  Location: [facility] INVASIVE CV LAB;  Service: Cardiovascular;  Laterality: N/A;  . CARDIOVERSION N/A 09/27/2019   Procedure: CARDIOVERSION;  Surgeon: Laurey Morale, MD;  Location: [facility] ENDOSCOPY;  Service: Cardiovascular;  Laterality: N/A;  . CARDIOVERSION N/A 11/30/2019   Procedure: CARDIOVERSION;  Surgeon: Laurey Morale, MD;  Location: [facility] ENDOSCOPY;  Service: Cardiovascular;  Laterality: N/A;  . DENTAL EXAMINATION UNDER ANESTHESIA    . RIGHT/LEFT HEART CATH AND CORONARY ANGIOGRAPHY N/A 09/06/2019   Procedure: RIGHT/LEFT HEART CATH AND CORONARY ANGIOGRAPHY;  Surgeon: Laurey Morale, MD;  Location: [facility] INVASIVE CV LAB;  Service: Cardiovascular;  Laterality: N/A;    Current Outpatient Medications  Medication Sig Dispense Refill  . amiodarone (PACERONE) 200 MG tablet Take 1 tablet (200 mg total) by mouth daily. 30 tablet 5  .  carvedilol (COREG) 25 MG tablet Take 0.5 tablets (12.5 mg total) by mouth 2 (two) times daily with a meal. 90 tablet 3  . ELIQUIS 5 MG TABS tablet TAKE 1 TABLET BY MOUTH TWICE A DAY (Patient taking differently: Take 5 mg by mouth 2 (two) times daily. ) 60 tablet 3   . rosuvastatin (CRESTOR) 10 MG tablet Take 1 tablet (10 mg total) by mouth daily. 90 tablet 1  . sacubitril-valsartan (ENTRESTO) 49-51 MG Take 1 tablet by mouth 2 (two) times daily. 60 tablet 6  . spironolactone (ALDACTONE) 25 MG tablet Take 0.5 tablets (12.5 mg total) by mouth every evening. 45 tablet 3   No current facility-administered medications for this encounter.    Allergies  Allergen Reactions  . Sulfa Antibiotics Swelling    "like a balloon"    Social History   Socioeconomic History  . Marital status: Widowed    Spouse name: Not on file  . Number of children: Not on file  . Years of education: Not on file  . Highest education level: Not on file  Occupational History  . Not on file  Tobacco Use  . Smoking status: Current Every Day Smoker    Packs/day: 1.00    Years: 50.00    Pack years: 50.00    Types: Cigarettes  . Smokeless tobacco: Never Used  . Tobacco comment: trying to "cut down"  Substance and Sexual Activity  . Alcohol use: Yes    Alcohol/week: 2.0 standard drinks    Types: 1 Cans of beer, 1 Standard drinks or equivalent per week    Comment: rare  . Drug use: Never  . Sexual activity: Not on file  Other Topics Concern  . 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 Difficulty of Paying Living Expenses: Not on file  Food Insecurity:   . Worried About Charity fundraiser in the Last Year: Not on file  . Ran Out of Food in the Last Year: Not on file  Transportation Needs:   . Lack of Transportation (Medical): Not on file  . Lack of Transportation (Non-Medical): Not on file  Physical Activity:   . Days of Exercise per Week: Not on file  . Minutes of Exercise per Session: Not on file  Stress:   . Feeling of Stress : Not on file  Social Connections:   . Frequency of Communication with Friends and Family: Not on file  . Frequency of  Social Gatherings with Friends and Family: Not on file  . Attends Religious Services: Not on file  . Active Member of Clubs or Organizations: Not on file  . Attends Archivist Meetings: Not on file  . Marital Status: Not on file  Intimate Partner Violence:   . Fear of Current or Ex-Partner: Not on file  . Emotionally Abused: Not on file  . Physically Abused: Not on file  . Sexually Abused: Not on file     ROS- All systems are reviewed and negative except as per the HPI above.  Physical Exam: Vitals:   12/27/19 1026  BP: (!) 102/54  Pulse: (!) 46  Weight: 107.4 kg  Height: 6\' 4"  (1.93 m)    GEN- The patient is well appearing, alert and oriented x 3 today.   HEENT-head normocephalic, atraumatic, sclera clear, conjunctiva pink, hearing intact, trachea midline. Lungs- Clear to  ausculation bilaterally, normal work of breathing Heart- Regular rate and rhythm, no murmurs, rubs or gallops  GI- soft, NT, ND, + BS Extremities- no clubbing, cyanosis, or edema MS- no significant deformity or atrophy Skin- no rash or lesion Psych- euthymic mood, full affect Neuro- strength and sensation are intact   Wt Readings from Last 3 Encounters:  12/27/19 107.4 kg  12/20/19 107.3 kg  12/07/19 102.1 kg    EKG today demonstrates SB HR 46, PAC, PR 200, QRS 106, QTc 446  Echo 03/25/19 demonstrated  1. The left ventricle has moderate-severely reduced systolic function, with an ejection fraction of 30-35%. The cavity size was normal. Left ventricular diastolic function could not be evaluated secondary to atrial fibrillation. Indeterminate filling  pressures Left ventricular diffuse hypokinesis. Beat to beat variability in LVEF due to atrial fibrillation.  2. The right ventricle has mildly reduced systolic function. The cavity was normal. There is no increase in right ventricular wall thickness. Right ventricular systolic pressure could not be assessed.  3. The inferior vena cava was  dilated in size with <50% respiratory variability.  Epic records are reviewed at length today  CHA2DS2-VASc Score = 3 The patient's score is based upon: CHF History: Yes HTN History: No Age : 59-74 Diabetes History: No Stroke History: No Vascular Disease History: Yes Gender: Male     ASSESSMENT AND PLAN: 1. Persistent Atrial Fibrillation (ICD10:  I48.19) The patient's CHA2DS2-VASc score is 3, indicating a 3.2% annual risk of stroke.   Patient s/p afib ablation with Dr Johney Frame 12/07/19.  Patient appears to be maintaining SR. R Groin hematoma markedly improved.  2. Secondary Hypercoagulable State (ICD10:  D68.69) The patient is at significant risk for stroke/thromboembolism based upon his CHA2DS2-VASc Score of 3.  Continue Apixaban (Eliquis) with no missed doses for at least 3 months post ablation.  3. Chronic systolic CHF NICM No signs or symptoms of fluid overload. Followed by Dr Shirlee Latch.  4. CAD Nonobstructive on cath 08/2019. No anginal symptoms.   Follow up with Dr Shirlee Latch and Dr Johney Frame as scheduled.    Jorja Loa PA-C [facility] [address] [place], [place] [address] [phone] 12/27/2019 11:14 AM

## 2020-01-04 ENCOUNTER — Ambulatory Visit (HOSPITAL_COMMUNITY): Payer: Medicaid Other | Admitting: Physician Assistant

## 2020-01-12 ENCOUNTER — Encounter (HOSPITAL_COMMUNITY): Payer: Medicaid Other | Admitting: Cardiology

## 2020-01-14 ENCOUNTER — Other Ambulatory Visit: Payer: Self-pay | Admitting: Internal Medicine

## 2020-01-22 ENCOUNTER — Encounter (HOSPITAL_COMMUNITY): Payer: Self-pay | Admitting: Cardiology

## 2020-01-22 ENCOUNTER — Other Ambulatory Visit: Payer: Self-pay

## 2020-01-22 ENCOUNTER — Ambulatory Visit (HOSPITAL_COMMUNITY)
Admission: RE | Admit: 2020-01-22 | Discharge: 2020-01-22 | Disposition: A | Discharge status: Home / Self Care | Payer: Medicaid Other | Source: Ambulatory Visit | Attending: Cardiology | Admitting: Cardiology

## 2020-01-22 VITALS — BP 124/72 | HR 48 | Wt 242.0 lb

## 2020-01-22 DIAGNOSIS — Z79899 Other long term (current) drug therapy: Secondary | ICD-10-CM | POA: Diagnosis not present

## 2020-01-22 DIAGNOSIS — I110 Hypertensive heart disease with heart failure: Secondary | ICD-10-CM | POA: Insufficient documentation

## 2020-01-22 DIAGNOSIS — F17210 Nicotine dependence, cigarettes, uncomplicated: Secondary | ICD-10-CM | POA: Insufficient documentation

## 2020-01-22 DIAGNOSIS — I428 Other cardiomyopathies: Secondary | ICD-10-CM | POA: Diagnosis not present

## 2020-01-22 DIAGNOSIS — I4891 Unspecified atrial fibrillation: Secondary | ICD-10-CM

## 2020-01-22 DIAGNOSIS — R001 Bradycardia, unspecified: Secondary | ICD-10-CM | POA: Diagnosis not present

## 2020-01-22 DIAGNOSIS — Z801 Family history of malignant neoplasm of trachea, bronchus and lung: Secondary | ICD-10-CM | POA: Diagnosis not present

## 2020-01-22 DIAGNOSIS — I5020 Unspecified systolic (congestive) heart failure: Secondary | ICD-10-CM

## 2020-01-22 DIAGNOSIS — I480 Paroxysmal atrial fibrillation: Secondary | ICD-10-CM | POA: Diagnosis not present

## 2020-01-22 DIAGNOSIS — Z8249 Family history of ischemic heart disease and other diseases of the circulatory system: Secondary | ICD-10-CM | POA: Diagnosis not present

## 2020-01-22 DIAGNOSIS — Z7901 Long term (current) use of anticoagulants: Secondary | ICD-10-CM | POA: Insufficient documentation

## 2020-01-22 DIAGNOSIS — I5022 Chronic systolic (congestive) heart failure: Secondary | ICD-10-CM | POA: Insufficient documentation

## 2020-01-22 DIAGNOSIS — I2510 Atherosclerotic heart disease of native coronary artery without angina pectoris: Secondary | ICD-10-CM | POA: Diagnosis not present

## 2020-01-22 LAB — COMPREHENSIVE METABOLIC PANEL
ALT: 24 U/L (ref 0–44)
AST: 21 U/L (ref 15–41)
Albumin: 3.4 g/dL — ABNORMAL LOW (ref 3.5–5.0)
Alkaline Phosphatase: 117 U/L (ref 38–126)
Anion gap: 10 (ref 5–15)
BUN: 17 mg/dL (ref 8–23)
CO2: 26 mmol/L (ref 22–32)
Calcium: 8.9 mg/dL (ref 8.9–10.3)
Chloride: 101 mmol/L (ref 98–111)
Creatinine, Ser: 1.19 mg/dL (ref 0.61–1.24)
GFR calc Af Amer: >60 mL/min (ref >60)
GFR calc non Af Amer: >60 mL/min (ref >60)
Glucose, Bld: 112 mg/dL — ABNORMAL HIGH (ref 70–99)
Potassium: 4.4 mmol/L (ref 3.5–5.1)
Sodium: 137 mmol/L (ref 135–145)
Total Bilirubin: 1.2 mg/dL (ref 0.3–1.2)
Total Protein: 7.2 g/dL (ref 6.5–8.1)

## 2020-01-22 LAB — TSH: TSH: 6.563 u[IU]/mL — ABNORMAL HIGH (ref 0.350–4.500)

## 2020-01-22 MED ORDER — CARVEDILOL 6.25 MG PO TABS: 6.2500 mg | ORAL_TABLET | Freq: Two times a day (BID) | ORAL | 5 refills | Status: DC

## 2020-01-22 MED ORDER — AMIODARONE HCL 100 MG PO TABS: 100.0000 mg | ORAL_TABLET | Freq: Every day | ORAL | 5 refills | Status: DC

## 2020-01-22 NOTE — Patient Instructions (Signed)
DECREASE Amiodarone to 100mg  (1 tab) daily   DECREASE Coreg 6.25mg  (1 tab) twice a day   Labs today We will only contact you if something comes back abnormal or we need to make some changes. Otherwise no news is good news!   Your physician recommends that you schedule a follow-up appointment in: 3 months with Dr    Please call office at [phone] option 2 if you have any questions or concerns.    At the [facility], you and your health needs are our priority. As part of our continuing mission to provide you with exceptional heart care, we have created designated Provider Care Teams. These Care Teams include your primary Cardiologist (physician) and Advanced Practice Providers (APPs- Physician Assistants and Nurse Practitioners) who all work together to provide you with the care you need, when you need it.   You may see any of the following providers on your designated Care Team at your next follow up: [phone] Dr Marland Kitchen . Dr Arvilla Meres . Marca Ancona, NP . Tonye Becket, PA . Robbie Lis, PharmD   Please be sure to bring in all your medications bottles to every appointment.

## 2020-01-23 NOTE — Progress Notes (Signed)
PCP: Mosetta Anis, MD Cardiology: Dr. Aundra Dubin  69 y.o. with history of HTN and smoking was recently diagnosed with atrial fibrillation and chronic systolic CHF.  Patient was admitted in 4/20 with left elbow septic bursitis.  Several weeks prior to the admission, he had been feeling palpitations and had been tiring more easily.  At the time of admission, he reported chest pressure.  He was found to be in atrial fibrillation with RVR.  Echo was done, showing EF depressed to 30-35%.  He was started on Coreg and it was titrated up to control his HR.  He was started on Eliquis.  He was not cardioverted.   In 10/20, he had LHC/RHC showing moderate nonobstructive CAD, elevated PCWP, and CI 1.85.  He was started on digoxin.  Three weeks later after restarting apixaban he had had successful DCCV to NSR later in 10/20.   He had recurrent atrial fibrillation and failed DCCV in 12/20.  He then had atrial fibrillation ablation in 1/21.    Cardiac MRI (1/21) with LV EF 47%, RV EF 46%, basal inferolateral and apical lateral LGE, looks like coronary disease pattern.   He returns for followup of CHF and atrial fibrillation.  He remains in NSR, HR in upper 40s.  He has been less active recently due to rain.  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  No significant exertional dyspnea walking on flat ground or with ADLs. No chest pain. He does get short of breath walking up a hill.  SBP 100s, he occasionally will get lightheaded with standing.  No falls. He is still smoking.   ECG (personally reviewed): NSR at 47, left axis deviation  Labs (5/20): LDL 65 Labs (8/20): K 4.5, creatinine 1.1, hgb 13.1 Labs (11/20): K 4, creatinine 1.08, hgb 15, digoxin 0.7 Labs (12/20): LDL 56 Labs (1/21): K 4.3, creatinine 1.2  PMH: 1. HTN 2. Atrial fibrillation: Paroxysmal. - DCCV to NSR in 10/20.  - Atrial fibrillation ablation 1/21.  3. Chronic systolic CHF: Nonischemic cardiomyopathy.  echo (4/20) with EF 30-35%, mildly  decreased RV systolic function.  - LHC/RHC (10/20): 60% mid LAD, 40% PLOM, 50% dRCA; mean RA 2, PA 45/20, mean PCWP 22, CI 1.85, PVR 2.3 WU.  - Cardiac MRI (1/21): LV EF 47%, RV EF 46%, basal inferolateral and apical lateral LGE, looks like coronary disease pattern.  4. H/o septic bursitis 5. CAD: LHC in 10/20 with moderate nonobstructive disease (see above).   SH: Works as Games developer, widowed, smokes 1 ppd. Lives in [place].   FH: Father with lung cancer.  Brother with CABG in his 5s.   ROS: All systems reviewed and negative except as per HPI.   Current Outpatient Medications  Medication Sig Dispense Refill  . amiodarone (PACERONE) 100 MG tablet Take 1 tablet (100 mg total) by mouth daily. 30 tablet 5  . apixaban (ELIQUIS) 5 MG TABS tablet Take 5 mg by mouth 2 (two) times daily.    . carvedilol (COREG) 6.25 MG tablet Take 1 tablet (6.25 mg total) by mouth 2 (two) times daily with a meal. 60 tablet 5  . rosuvastatin (CRESTOR) 10 MG tablet Take 1 tablet (10 mg total) by mouth daily. 90 tablet 1  . sacubitril-valsartan (ENTRESTO) 49-51 MG Take 1 tablet by mouth 2 (two) times daily. 60 tablet 6  . spironolactone (ALDACTONE) 25 MG tablet Take 0.5 tablets (12.5 mg total) by mouth every evening. 45 tablet 3   No current facility-administered medications for this encounter.  BP 124/72   Pulse (!) 48   Wt 109.8 kg (242 lb)   SpO2 100%   BMI 29.46 kg/m  General: NAD Neck: No JVD, no thyromegaly or thyroid nodule.  Lungs: Occasional rhonchi CV: Nondisplaced PMI.  Heart regular S1/S2, no S3/S4, no murmur.  Trace ankle edema.  No carotid bruit.  Normal pedal pulses.  Abdomen: Soft, nontender, no hepatosplenomegaly, no distention.  Skin: Intact without lesions or rashes.  Neurologic: Alert and oriented x 3.  Psych: Normal affect. Extremities: No clubbing or cyanosis.  HEENT: Normal.   Assessment/Plan: 1. Chronic systolic CHF: Echo in 4/20 with EF 30-35%, mildly decreased RV systolic  function.  Nonischemic cardiomyopathy, RHC/LHC in 10/20 showed nonobstructive CAD and low output with CI 1.85.  It is possible that this is a tachycardia-mediated cardiomyopathy with EF down because of atrial fibrillation.  It is also possible that the cardiomyopathy is from some other cause and that the cardiomyopathy itself predisposed him to atrial fibrillation.  Cardiac MRI in 1/21 showed some improvement, with LV EF 47% and normal RV, there was coronary pattern LGE in the inferolateral wall.  NYHA class II.  He is not volume overloaded on exam. BP is on the lower side and he has mild bradycardia.   - Decrease Coreg to 6.25 mg bid with bradycardia and soft BP.  - Continue Entresto 49/51 bid.  - Continue spironolactone 12.5 mg daily.  BMET today.  - I would like to see him back in NSR.  2. Atrial fibrillation: Paroxysmal.  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  He had DCCV to NSR in 10/20 but afib recurred.  He had atrial fibrillation ablation in 1/21.  He is in NSR today.   - Continue Eliquis 5 mg bid.  - Decrease amiodarone to 100 mg daily.  Hopefully, can stop it in the near future now that he has had atrial fibrillation ablation.  Check LFTs and TSH, will need regular eye exam while on amiodarone.  3. Smoking: I strongly recommended that he quit.  He is going to try nicotine patches.  4. HTN: BP now running on the lower side. Cutting back on Coreg and amiodarone as above.   5. CAD: Moderate nonobstructive CAD on 10/20 cath.  - No ASA given apixaban use.  - Continue Crestor 10 mg daily, good lipids in 12/20.   Followup 3 months.    Marca Ancona 01/23/2020

## 2020-03-01 ENCOUNTER — Other Ambulatory Visit: Payer: Self-pay | Admitting: Internal Medicine

## 2020-03-01 DIAGNOSIS — I4891 Unspecified atrial fibrillation: Secondary | ICD-10-CM

## 2020-03-04 ENCOUNTER — Encounter: Payer: Self-pay | Admitting: Internal Medicine

## 2020-03-04 ENCOUNTER — Other Ambulatory Visit: Payer: Self-pay

## 2020-03-04 ENCOUNTER — Telehealth: Payer: Self-pay

## 2020-03-04 ENCOUNTER — Telehealth (INDEPENDENT_AMBULATORY_CARE_PROVIDER_SITE_OTHER): Payer: Medicaid Other | Admitting: Internal Medicine

## 2020-03-04 VITALS — BP 149/86 | HR 51 | Ht 76.0 in | Wt 236.0 lb

## 2020-03-04 DIAGNOSIS — I428 Other cardiomyopathies: Secondary | ICD-10-CM

## 2020-03-04 DIAGNOSIS — I4819 Other persistent atrial fibrillation: Secondary | ICD-10-CM | POA: Diagnosis not present

## 2020-03-04 DIAGNOSIS — D6869 Other thrombophilia: Secondary | ICD-10-CM | POA: Diagnosis not present

## 2020-03-04 NOTE — Telephone Encounter (Signed)
-----   Message from Hillis Range, MD sent at 03/04/2020  9:01 AM EDT ----- Return to see me in 3 months Echo prior to the visit

## 2020-03-04 NOTE — Progress Notes (Signed)
Electrophysiology TeleHealth Note  Due to national recommendations of social distancing due to [place] 19, an audio telehealth visit is felt to be most appropriate for this patient at this time.  Verbal consent was obtained by me for the telehealth visit today.  The patient does not have capability for a virtual visit.  A phone visit is therefore required today.   Date:  03/04/2020   ID:  Sean Barnett, DOB 07-23-1951, MRN [id number]  Location: patient's home  Provider location:  [place]  Evaluation Performed: Follow-up visit  PCP:  Mosetta Anis, MD   Electrophysiologist:  Dr Rayann Heman  Chief Complaint:  palpitations  History of Present Illness:    Sean Barnett is a 69 y.o. male who presents via telehealth conferencing today.  Since his afib ablation, the patient reports doing very well.  He had small hematoma initially for which he returned to the ED.  He feels that this is completely healed.  Today, he denies symptoms of palpitations, chest pain, shortness of breath,  lower extremity edema, presyncope, or syncope.  He has occasional postural dizziness which he attributes to entresto.  The patient is otherwise without complaint today.  The patient denies symptoms of fevers, chills, cough, or new SOB worrisome for COVID 19.  Past Medical History:  Diagnosis Date  . Bursitis of elbow 03/2019   left elbow  . DDD (degenerative disc disease), lumbar   . Dyspnea 03/30/2019  . Insomnia 03/30/2019  . Nonischemic cardiomyopathy ([place])   . Nonobstructive CAD   . Persistent atrial fibrillation ([place])   . Septic bursitis of elbow, left 04/13/2019    Past Surgical History:  Procedure Laterality Date  . ATRIAL FIBRILLATION ABLATION N/A 12/07/2019   Procedure: ATRIAL FIBRILLATION ABLATION;  Surgeon: Thompson Grayer, MD;  Location: [place] [facility];  Service: Cardiovascular;  Laterality: N/A;  . CARDIOVERSION N/A 09/27/2019   Procedure: CARDIOVERSION;  Surgeon: Larey Dresser, MD;   Location: [facility] ENDOSCOPY;  Service: Cardiovascular;  Laterality: N/A;  . CARDIOVERSION N/A 11/30/2019   Procedure: CARDIOVERSION;  Surgeon: Larey Dresser, MD;  Location: [facility] ENDOSCOPY;  Service: Cardiovascular;  Laterality: N/A;  . DENTAL EXAMINATION UNDER ANESTHESIA    . RIGHT/LEFT HEART CATH AND CORONARY ANGIOGRAPHY N/A 09/06/2019   Procedure: RIGHT/LEFT HEART CATH AND CORONARY ANGIOGRAPHY;  Surgeon: Larey Dresser, MD;  Location: [place] [facility];  Service: Cardiovascular;  Laterality: N/A;    Current Outpatient Medications  Medication Sig Dispense Refill  . amiodarone (PACERONE) 100 MG tablet Take 1 tablet (100 mg total) by mouth daily. 30 tablet 5  . apixaban (ELIQUIS) 5 MG TABS tablet Take 5 mg by mouth 2 (two) times daily.    . carvedilol (COREG) 6.25 MG tablet Take 1 tablet (6.25 mg total) by mouth 2 (two) times daily with a meal. 60 tablet 5  . sacubitril-valsartan (ENTRESTO) 49-51 MG Take 1 tablet by mouth 2 (two) times daily. 60 tablet 6  . rosuvastatin (CRESTOR) 10 MG tablet Take 1 tablet (10 mg total) by mouth daily. 90 tablet 1  . spironolactone (ALDACTONE) 25 MG tablet Take 0.5 tablets (12.5 mg total) by mouth every evening. 45 tablet 3   No current facility-administered medications for this visit.    Allergies:   Sulfa antibiotics   Social History:  The patient  reports that he has been smoking cigarettes. He has a 50.00 pack-year smoking history. He has never used smokeless tobacco. He reports current alcohol use of about 2.0 standard  drinks of alcohol per week. He reports that he does not use drugs.   Family History:  The patient's family history includes Lung cancer in his father.   ROS:  Please see the history of present illness.   All other systems are personally reviewed and negative.    Exam:    Vital Signs:  BP (!) 149/86   Pulse (!) 51   Ht 6\' 4"  (1.93 m)   Wt 236 lb (107 kg)   BMI 28.73 kg/m   Well sounding, alert and conversant   Labs/Other Tests  and Data Reviewed:    Recent Labs: 08/29/2019: B Natriuretic Peptide 737.2 12/17/2019: Hemoglobin 13.3; Platelets 310 01/22/2020: ALT 24; BUN 17; Creatinine, Ser 1.19; Potassium 4.4; Sodium 137; TSH 6.563   Wt Readings from Last 3 Encounters:  03/04/20 236 lb (107 kg)  01/22/20 242 lb (109.8 kg)  12/27/19 236 lb 12.8 oz (107.4 kg)       ASSESSMENT & PLAN:    1.  Persistent atrial fibrillation Doing well post ablation Stop amiodarone Continue eliquis for chads2vasc score of 3  2. Chronic systolic dysfunction No symptoms of CHF currently Repeat echo in 3 months    Follow-up:  3 months with me with an echo Follow-up in CHF clinic as scheduled   Patient Risk:  after full review of this patients clinical status, I feel that they are at moderate risk at this time.  Today, I have spent 15 minutes with the patient with telehealth technology discussing arrhythmia management .    12/29/19, MD  03/04/2020 8:55 AM     [facility] [address] [place] [phone] (office) [phone] (fax)

## 2020-03-04 NOTE — Telephone Encounter (Signed)
Order for ECHO entered.  Will need f/u visit AFTER echo complete.

## 2020-03-06 NOTE — Telephone Encounter (Signed)
Called patient LMOM .  Will try again to reach the patient.

## 2020-03-08 ENCOUNTER — Other Ambulatory Visit (HOSPITAL_COMMUNITY): Payer: Self-pay | Admitting: Cardiology

## 2020-03-11 ENCOUNTER — Encounter: Payer: Self-pay | Admitting: Internal Medicine

## 2020-03-11 NOTE — Telephone Encounter (Signed)
Called patient no answer.  Mailed a letter to the patient to call and schedule an appointment.

## 2020-03-25 ENCOUNTER — Encounter: Payer: Self-pay | Admitting: Internal Medicine

## 2020-03-25 ENCOUNTER — Ambulatory Visit: Payer: Medicaid Other | Admitting: Internal Medicine

## 2020-03-25 VITALS — BP 137/80 | HR 52 | Temp 97.7°F | Ht 76.0 in | Wt 243.8 lb

## 2020-03-25 DIAGNOSIS — M21961 Unspecified acquired deformity of right lower leg: Secondary | ICD-10-CM | POA: Insufficient documentation

## 2020-03-25 DIAGNOSIS — R200 Anesthesia of skin: Secondary | ICD-10-CM

## 2020-03-25 DIAGNOSIS — F17210 Nicotine dependence, cigarettes, uncomplicated: Secondary | ICD-10-CM | POA: Diagnosis not present

## 2020-03-25 DIAGNOSIS — Z720 Tobacco use: Secondary | ICD-10-CM

## 2020-03-25 NOTE — Assessment & Plan Note (Addendum)
Mr.Lahey also mentions intermittent episodes of bilateral lower extremity numbness. Occurs most frequently at night. Occurs from tip of his toes to midfoot. Denies any burning, pinprick sensation. On exam, sensation intact.  Unclear etiology but lab review shows recent signs of hypothyroidism with elevated TSH. Will re-check thyroid labs and assess for vitamin deficiency and diabetes  - B12, Hgb a1c - TSH, T3, T4  Addendum: TSH elevated 6 but T3, T4 wnl. Consistent w/ subclinical hypothyroidism. Will defer thyroid replacement therapy unless his TSH is above 10.

## 2020-03-25 NOTE — Progress Notes (Signed)
CC: Foot pain  HPI: [name] is a 69 y.o. with PMH listed below presenting with complaint of foot pain. Please see problem based assessment and plan for further details.  Past Medical History:  Diagnosis Date  . Bursitis of elbow 03/2019   left elbow  . DDD (degenerative disc disease), lumbar   . Dyspnea 03/30/2019  . Insomnia 03/30/2019  . Nonischemic cardiomyopathy (H[facility])   . Nonobstructive CAD   . Persistent atrial fibrillation ([facility])   . Septic bursitis of elbow, left 04/13/2019   Review of Systems: Review of Systems  Constitutional: Negative for chills, fever and malaise/fatigue.  Respiratory: Negative for shortness of breath.   Cardiovascular: Negative for chest pain and leg swelling.  Gastrointestinal: Negative for constipation, diarrhea, nausea and vomiting.  Neurological: Negative for dizziness and weakness.     Physical Exam: Vitals:   03/25/20 1327  BP: 137/80  Pulse: (!) 52  Temp: 97.7 F (36.5 C)  TempSrc: Oral  SpO2: 100%  Weight: 243 lb 12.8 oz (110.6 kg)  Height: 6\' 4"  (1.93 m)    Physical Exam  Constitutional: He is oriented to person, place, and time. He appears well-developed and well-nourished. No distress.  HENT:  Mouth/Throat: Oropharynx is clear and moist.  Cardiovascular: Normal rate, regular rhythm, normal heart sounds and intact distal pulses.  No murmur heard. Respiratory: Effort normal and breath sounds normal. He has no wheezes. He has no rales.  GI: Soft. Bowel sounds are normal. He exhibits no distension. There is no abdominal tenderness.  Musculoskeletal:        General: Deformity (Bony protrusion over 2nd MTP joint of R foot. Firm, non-mobile mass palpated on plantar aspect of R foot without erythema, tenderness or warmth.) present. Normal range of motion.  Neurological: He is alert and oriented to person, place, and time.  Lower extremity fine sensation intact    Assessment & Plan:   Foot deformity, right Sean Barnett is a  69 yo M w/ PMh of systolic heart failure, persistent A.fib on eliquis and tobacco use presenting to [facility] w/ complaint of foot pain. He mentions that over the last 2-3 years he noticed a bony protrusion on his 2nd toe on his r foot as well as on the bottom that has been bothering him. He states that when he wears more snug-fitting shoes, such as his steel-toed boots, he has worsening discomfort and pain. He denies any fevers, chills, nausea, vomiting. Able to ambulate without support.  Physical exam reveals bony arthritic deformities. Likely would benefit from shoe inserts. Will refer to podiatry for assistance.  - Referral to podiatry placed  Tobacco use History of tobacco use. Currently pack daily. >30 pack year smoking history. Discussed with Sean Barnett again regarding risks and benefits of low-dose CT for lung cancer screening. States that considering his age, he would rather not know and would like to defer screening CT indefinitely. Continued to encourage smoking cessation. Sean Barnett states he is trying and denies additional smoking cessation aid at this time.  - C/w smoking cessation counseling  Lower extremity numbness [name] also mentions intermittent episodes of bilateral lower extremity numbness. Occurs most frequently at night. Occurs from tip of his toes to midfoot. Denies any burning, pinprick sensation. On exam, sensation intact.  Unclear etiology but lab review shows recent signs of hypothyroidism with elevated TSH. Will re-check thyroid labs and assess for vitamin deficiency and diabetes  - B12, Hgb a1c - TSH, T3, T4    Patient discussed with Dr. [facility]   -  Gilberto Better, PGY2 [facility] Internal Medicine Pager: [phone]

## 2020-03-25 NOTE — Patient Instructions (Signed)
Thank you for allowing [place] to provide your care today. Today we discussed your foot pain.  I have ordered b12, q1c, tsh labs for you. I will call if any are abnormal.    Today we made no changes to your medications.    Please follow-up in 12 months.    Should you have any questions or concerns please call the internal medicine clinic at [phone].     Foot Care, Adult Foot care is an important part of your health. Noticing and addressing any potential problems early is the best way to prevent future foot problems. How to care for your feet Foot Hygiene  Wash your feet daily with warm water and mild soap. Do not use hot water. Then, pat your feet and the areas between your toes until they are completely dry. Do not soak your feet as this can dry your skin.  Trim your toenails straight across. Do not dig under them or around the cuticle. File the edges of your nails with an emery board or nail file.  On the skin on your feet and on dry, brittle nails, apply a moisturizing lotion or petroleum jelly that is unscented and does not contain alcohol. Do not apply lotion between your toes. Shoes and Socks   Wear clean socks or stockings every day. Make sure they are not too tight.  Wear shoes that fit properly and have enough cushioning. To break in new shoes, wear them for just a few hours a day. This prevents you from injuring your feet. Always look in your shoes before you put them on to be sure there are no objects inside. Wounds, Scrapes, Corns, and Calluses  Check your feet daily for blisters, cuts, and redness. If you cannot see the bottom of your feet, use a mirror or ask someone for help.  Do not cut corns or calluses. Do not try to remove them with medicine.  If you find a minor scrape, cut, or break in the skin on your feet, keep it and the skin around it clean and dry. These areas may be cleaned with mild soap and water. Do not clean the area with peroxide, alcohol, or  iodine.  If you have a wound, scrape, corn, or callus on your foot, look at it several times a day to make sure it is healing and is not infected. Check for: ? More redness, swelling, or pain. ? More fluid or blood. ? Warmth. ? Pus or a bad smell. General Instructions  Do not cross your legs. That may decrease the blood flow to your feet.  Do not use heating pads or hot water bottles on your feet. They may burn your skin. If you have lost feeling in your feet or legs, you may not know it is happening until it is too late.  Make sure your health care provider does a complete foot exam at least annually or more often if you have foot problems. If you have foot problems, report any cuts, sores, or bruises to your health care provider immediately. Contact a health care provider if:  You have a medical condition that increases your risk of infection and you have any cuts, sores, or bruises on your feet.  You have an injury that is not healing.  You notice redness on your legs or feet.  You feel burning or tingling in your legs or feet.  You have pain or cramps in your legs or feet.  Your legs or feet are numb.  Your feet always feel cold.  You have pain around a toenail. Get help right away if:  You have a wound, scrape, corn, or callus on your foot and: ? You have more redness, swelling, or pain. ? You have more fluid or blood. ? Your wound, scrape, corn, or callus feels warm to the touch. ? You have pus or a bad smell coming from the wound, scrape, corn, or callus. ? You have a fever.  You have a red line going up your leg. This information is not intended to replace advice given to you by your health care provider. Make sure you discuss any questions you have with your health care provider. Document Revised: 12/03/2016 Document Reviewed: 04/24/2016 Elsevier Patient Education  [employer].

## 2020-03-25 NOTE — Assessment & Plan Note (Signed)
History of tobacco use. Currently pack daily. >30 pack year smoking history. Discussed with [name] again regarding risks and benefits of low-dose CT for lung cancer screening. States that considering his age, he would rather not know and would like to defer screening CT indefinitely. Continued to encourage smoking cessation. [name] states he is trying and denies additional smoking cessation aid at this time.  - C/w smoking cessation counseling

## 2020-03-25 NOTE — Assessment & Plan Note (Addendum)
Sean Barnett is a 69 yo M w/ PMh of systolic heart failure, persistent A.fib on eliquis and tobacco use presenting to [facility] w/ complaint of foot pain. He mentions that over the last 2-3 years he noticed a bony protrusion on his 2nd toe on his r foot as well as on the bottom that has been bothering him. He states that when he wears more snug-fitting shoes, such as his steel-toed boots, he has worsening discomfort and pain. He denies any fevers, chills, nausea, vomiting. Able to ambulate without support.  Physical exam reveals bony arthritic deformities. Likely would benefit from shoe inserts. Will refer to podiatry for assistance.  - Referral to podiatry placed

## 2020-03-26 LAB — T4, FREE: Free T4: 1.14 ng/dL (ref 0.82–1.77)

## 2020-03-26 LAB — T3: T3, Total: 80 ng/dL (ref 71–180)

## 2020-03-26 LAB — HEMOGLOBIN A1C
Est. average glucose Bld gHb Est-mCnc: 120 mg/dL
Hgb A1c MFr Bld: 5.8 % — ABNORMAL HIGH (ref 4.8–5.6)

## 2020-03-26 LAB — VITAMIN B12: Vitamin B-12: 536 pg/mL (ref 232–1245)

## 2020-03-26 LAB — TSH: TSH: 6.09 u[IU]/mL — ABNORMAL HIGH (ref 0.450–4.500)

## 2020-03-26 NOTE — Progress Notes (Signed)
Internal Medicine Clinic Attending  Case discussed with Dr. Jones at the time of the visit.  We reviewed the resident's history and exam and pertinent patient test results.  I agree with the assessment, diagnosis, and plan of care documented in the resident's note.  

## 2020-03-28 ENCOUNTER — Telehealth: Payer: Self-pay | Admitting: Internal Medicine

## 2020-03-28 NOTE — Telephone Encounter (Signed)
Attempted to speak with Sean Barnett regarding his lab results. Did not pick up. Voicemail left with callback number.

## 2020-04-05 ENCOUNTER — Other Ambulatory Visit (HOSPITAL_COMMUNITY): Payer: Self-pay | Admitting: Cardiology

## 2020-04-12 ENCOUNTER — Ambulatory Visit (INDEPENDENT_AMBULATORY_CARE_PROVIDER_SITE_OTHER): Payer: Medicaid Other

## 2020-04-12 ENCOUNTER — Other Ambulatory Visit: Payer: Self-pay

## 2020-04-12 ENCOUNTER — Ambulatory Visit: Payer: Medicaid Other | Admitting: Podiatry

## 2020-04-12 VITALS — Temp 97.2°F

## 2020-04-12 DIAGNOSIS — M2042 Other hammer toe(s) (acquired), left foot: Secondary | ICD-10-CM

## 2020-04-12 DIAGNOSIS — M7751 Other enthesopathy of right foot: Secondary | ICD-10-CM

## 2020-04-12 DIAGNOSIS — M7752 Other enthesopathy of left foot: Secondary | ICD-10-CM | POA: Diagnosis not present

## 2020-04-12 DIAGNOSIS — M79671 Pain in right foot: Secondary | ICD-10-CM

## 2020-04-12 DIAGNOSIS — M2041 Other hammer toe(s) (acquired), right foot: Secondary | ICD-10-CM

## 2020-04-12 NOTE — Progress Notes (Signed)
  Subjective:  Patient ID: Sean Barnett, male    DOB: Mar 08, 1951,  MRN: [id number]  Chief Complaint  Patient presents with  . Foot Problem    Bilateral plantar forefoot submet 2. x3 years. R worse than L. Pt stated, "Feels like I'm walking on a rock when I'm barefoot. Doesn't hurt. My toes also become numb - that usually happens at night".  . Foot Problem    Bilateral 2nd toes. Pt stated, "They're bent - I wonder if I have arthritis. They rub against my shoes".    69 y.o. male presents with the above complaint. History confirmed with patient.   Objective:  Physical Exam: warm, good capillary refill, no trophic changes or ulcerative lesions, normal DP and PT pulses and normal sensory exam. Left Foot: POP 2nd MPJ, 2nd Hammertoe deformity  Right Foot: POP 2nd MPJ, 2nd Hammertoe deformity   No images are attached to the encounter.  Radiographs: X-ray of both feet: elongated second metatarsal no acute fractures 2nd met thickening Assessment:   1. Capsulitis of metatarsophalangeal (MTP) joints of both feet   2. Hammertoes of both feet      Plan:  Patient was evaluated and treated and all questions answered.  Capsulitis -Educated on etiology -XR reviewed with patient -Discussed padding and proper shoegear -Offloading pad applied -Discussed surgical intervention should pain persist. -Injection delivered to the painful joint  Procedure: Joint Injection Location: Bilateral 2nd MPJ joint Skin Prep: Alcohol. Injectate: 0.5 cc 1% lidocaine plain, 0.5 cc dexamethasone phosphate. Disposition: Patient tolerated procedure well. Injection site dressed with a band-aid.   Return in about 6 weeks (around 05/24/2020) for Capsulitis.

## 2020-04-16 ENCOUNTER — Other Ambulatory Visit: Payer: Self-pay | Admitting: Podiatry

## 2020-04-16 DIAGNOSIS — M7752 Other enthesopathy of left foot: Secondary | ICD-10-CM

## 2020-04-25 ENCOUNTER — Encounter (HOSPITAL_COMMUNITY): Payer: Self-pay | Admitting: Cardiology

## 2020-04-25 ENCOUNTER — Ambulatory Visit (HOSPITAL_COMMUNITY)
Admission: RE | Admit: 2020-04-25 | Discharge: 2020-04-25 | Disposition: A | Discharge status: Home / Self Care | Payer: Medicaid Other | Source: Ambulatory Visit | Attending: Cardiology | Admitting: Cardiology

## 2020-04-25 ENCOUNTER — Other Ambulatory Visit: Payer: Self-pay

## 2020-04-25 VITALS — BP 122/68 | HR 51 | Wt 244.0 lb

## 2020-04-25 DIAGNOSIS — R001 Bradycardia, unspecified: Secondary | ICD-10-CM | POA: Diagnosis not present

## 2020-04-25 DIAGNOSIS — F17210 Nicotine dependence, cigarettes, uncomplicated: Secondary | ICD-10-CM | POA: Diagnosis not present

## 2020-04-25 DIAGNOSIS — I428 Other cardiomyopathies: Secondary | ICD-10-CM | POA: Insufficient documentation

## 2020-04-25 DIAGNOSIS — Z79899 Other long term (current) drug therapy: Secondary | ICD-10-CM | POA: Diagnosis not present

## 2020-04-25 DIAGNOSIS — Z7901 Long term (current) use of anticoagulants: Secondary | ICD-10-CM | POA: Insufficient documentation

## 2020-04-25 DIAGNOSIS — I2510 Atherosclerotic heart disease of native coronary artery without angina pectoris: Secondary | ICD-10-CM | POA: Diagnosis not present

## 2020-04-25 DIAGNOSIS — I5022 Chronic systolic (congestive) heart failure: Secondary | ICD-10-CM | POA: Diagnosis not present

## 2020-04-25 DIAGNOSIS — I480 Paroxysmal atrial fibrillation: Secondary | ICD-10-CM | POA: Insufficient documentation

## 2020-04-25 DIAGNOSIS — I110 Hypertensive heart disease with heart failure: Secondary | ICD-10-CM | POA: Insufficient documentation

## 2020-04-25 DIAGNOSIS — I4819 Other persistent atrial fibrillation: Secondary | ICD-10-CM | POA: Diagnosis not present

## 2020-04-25 DIAGNOSIS — Z9889 Other specified postprocedural states: Secondary | ICD-10-CM | POA: Diagnosis not present

## 2020-04-25 LAB — BASIC METABOLIC PANEL
Anion gap: 8 (ref 5–15)
BUN: 24 mg/dL — ABNORMAL HIGH (ref 8–23)
CO2: 22 mmol/L (ref 22–32)
Calcium: 8.7 mg/dL — ABNORMAL LOW (ref 8.9–10.3)
Chloride: 109 mmol/L (ref 98–111)
Creatinine, Ser: 1.28 mg/dL — ABNORMAL HIGH (ref 0.61–1.24)
GFR calc Af Amer: >60 mL/min (ref >60)
GFR calc non Af Amer: 57 mL/min — ABNORMAL LOW (ref >60)
Glucose, Bld: 106 mg/dL — ABNORMAL HIGH (ref 70–99)
Potassium: 4.3 mmol/L (ref 3.5–5.1)
Sodium: 139 mmol/L (ref 135–145)

## 2020-04-25 MED ORDER — SPIRONOLACTONE 25 MG PO TABS: 25.0000 mg | ORAL_TABLET | Freq: Every evening | ORAL | 3 refills | Status: DC

## 2020-04-25 NOTE — Progress Notes (Signed)
PCP: Theotis Barrio, MD Cardiology: Dr. Shirlee Latch  69 y.o. with history of HTN and smoking was recently diagnosed with atrial fibrillation and chronic systolic CHF.  Patient was admitted in 4/20 with left elbow septic bursitis.  Several weeks prior to the admission, he had been feeling palpitations and had been tiring more easily.  At the time of admission, he reported chest pressure.  He was found to be in atrial fibrillation with RVR.  Echo was done, showing EF depressed to 30-35%.  He was started on Coreg and it was titrated up to control his HR.  He was started on Eliquis.  He was not cardioverted.   In 10/20, he had LHC/RHC showing moderate nonobstructive CAD, elevated PCWP, and CI 1.85.  He was started on digoxin.  Three weeks later after restarting apixaban he had had successful DCCV to NSR later in 10/20.   He had recurrent atrial fibrillation and failed DCCV in 12/20.  He then had atrial fibrillation ablation in 1/21.  He is now off amiodarone.   Cardiac MRI (1/21) with LV EF 47%, RV EF 46%, basal inferolateral and apical lateral LGE, looks like coronary disease pattern.   He returns for followup of CHF and atrial fibrillation.  Weight is stable.  He continues to smoke, not interested in quitting. 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  No exertional dyspnea.  Rare atypical chest pain.  No lightheadedness.  No BRBPR/melena.  He has difficulty with constipation.   ECG (personally reviewed): NSR at 49, iRBBB, LAFB  Labs (5/20): LDL 65 Labs (8/20): K 4.5, creatinine 1.1, hgb 13.1 Labs (11/20): K 4, creatinine 1.08, hgb 15, digoxin 0.7 Labs (12/20): LDL 56 Labs (1/21): K 4.3, creatinine 1.2 Labs (2/21): K 4.4, creatinine 1.19  PMH: 1. HTN 2. Atrial fibrillation: Paroxysmal. - DCCV to NSR in 10/20.  - Atrial fibrillation ablation 1/21.  3. Chronic systolic CHF: Nonischemic cardiomyopathy.  echo (4/20) with EF 30-35%, mildly decreased RV systolic function.  - LHC/RHC (10/20): 60% mid  LAD, 40% PLOM, 50% dRCA; mean RA 2, PA 45/20, mean PCWP 22, CI 1.85, PVR 2.3 WU.  - Cardiac MRI (1/21): LV EF 47%, RV EF 46%, basal inferolateral and apical lateral LGE, looks like coronary disease pattern.  4. H/o septic bursitis 5. CAD: LHC in 10/20 with moderate nonobstructive disease (see above).   SH: Works as Music therapist, widowed, smokes 1 ppd. Lives in [place].   FH: Father with lung cancer.  Brother with CABG in his 7s.   ROS: All systems reviewed and negative except as per HPI.   Current Outpatient Medications  Medication Sig Dispense Refill  . carvedilol (COREG) 6.25 MG tablet Take 1 tablet (6.25 mg total) by mouth 2 (two) times daily with a meal. 60 tablet 5  . ELIQUIS 5 MG TABS tablet TAKE 1 TABLET BY MOUTH TWICE A DAY 60 tablet 3  . rosuvastatin (CRESTOR) 10 MG tablet TAKE 1 TABLET BY MOUTH EVERY DAY 90 tablet 3  . sacubitril-valsartan (ENTRESTO) 49-51 MG Take 1 tablet by mouth 2 (two) times daily. 60 tablet 6  . spironolactone (ALDACTONE) 25 MG tablet Take 1 tablet (25 mg total) by mouth every evening. 90 tablet 3   No current facility-administered medications for this encounter.   BP 122/68   Pulse (!) 51   Wt 110.7 kg (244 lb)   SpO2 99%   BMI 29.70 kg/m  General: NAD Neck: No JVD, no thyromegaly or thyroid nodule.  Lungs:  Clear to auscultation bilaterally with normal respiratory effort. CV: Nondisplaced PMI.  Heart regular S1/S2, no S3/S4, no murmur.  No peripheral edema.  No carotid bruit.  Normal pedal pulses.  Abdomen: Soft, nontender, no hepatosplenomegaly, no distention.  Skin: Intact without lesions or rashes.  Neurologic: Alert and oriented x 3.  Psych: Normal affect. Extremities: No clubbing or cyanosis.  HEENT: Normal.   Assessment/Plan: 1. Chronic systolic CHF: Echo in 2/95 with EF 30-35%, mildly decreased RV systolic function.  Nonischemic cardiomyopathy, RHC/LHC in 10/20 showed nonobstructive CAD and low output with CI 1.85.  It is possible that  this is a tachycardia-mediated cardiomyopathy with EF down because of atrial fibrillation.  It is also possible that the cardiomyopathy is from some other cause and that the cardiomyopathy itself predisposed him to atrial fibrillation.  Cardiac MRI in 1/21 showed some improvement, with LV EF 47% and normal RV, there was coronary pattern LGE in the inferolateral wall.  NYHA class II.  He is not volume overloaded on exam. He has mild bradycardia.   - Continue Coreg 6.25 mg bid, dose decreased in the past due to bradycardia.   - Continue Entresto 49/51 bid.  - Increase spironolactone to 25 mg daily with BMET today and in 10 days.   - Echo at followup in 3 months.  2. Atrial fibrillation: Paroxysmal.  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  He had DCCV to NSR in 10/20 but afib recurred.  He had atrial fibrillation ablation in 1/21.  He is in NSR today.  Amiodarone was stopped post-ablation.   - Continue Eliquis 5 mg bid.   3. Smoking: I strongly recommended that he quit.  He is not ready.  4. HTN: BP controlled.   5. CAD: Moderate nonobstructive CAD on 10/20 cath.  - No ASA given apixaban use.  - Continue Crestor 10 mg daily, good lipids in 12/20.   Followup 3 months with echo.    [name] 04/25/2020

## 2020-04-25 NOTE — Patient Instructions (Signed)
Labs done today. We will contact you only if your labs are abnormal.  INCREASE Spironolactone 25mg (1 tablet) by mouth daily.  No other medication changes were made. Please continue all other medications as prescribed.  Your physician recommends that you schedule a follow-up appointment in: 10days for a lab only appointment and in 4 months with an echo prior to your appointment.  Your physician has requested that you have an echocardiogram. Echocardiography is a painless test that uses sound waves to create images of your heart. It provides your doctor with information about the size and shape of your heart and how well your heart's chambers and valves are working. This procedure takes approximately one hour. There are no restrictions for this procedure.  At the [facility], you and your health needs are our priority. As part of our continuing mission to provide you with exceptional heart care, we have created designated Provider Care Teams. These Care Teams include your primary Cardiologist (physician) and Advanced Practice Providers (APPs- Physician Assistants and Nurse Practitioners) who all work together to provide you with the care you need, when you need it.   You may see any of the following providers on your designated Care Team at your next follow up: Dr Marland Kitchen . Dr Arvilla Meres . Marca Ancona, NP . Tonye Becket, PA . Robbie Lis, PharmD   Please be sure to bring in all your medications bottles to every appointment.

## 2020-05-06 ENCOUNTER — Other Ambulatory Visit: Payer: Self-pay

## 2020-05-06 ENCOUNTER — Ambulatory Visit (HOSPITAL_COMMUNITY)
Admission: RE | Admit: 2020-05-06 | Discharge: 2020-05-06 | Disposition: A | Discharge status: Home / Self Care | Payer: Medicaid Other | Source: Ambulatory Visit | Attending: Internal Medicine | Admitting: Internal Medicine

## 2020-05-06 DIAGNOSIS — I5022 Chronic systolic (congestive) heart failure: Secondary | ICD-10-CM | POA: Diagnosis not present

## 2020-05-06 LAB — BASIC METABOLIC PANEL
Anion gap: 7 (ref 5–15)
BUN: 27 mg/dL — ABNORMAL HIGH (ref 8–23)
CO2: 24 mmol/L (ref 22–32)
Calcium: 8.7 mg/dL — ABNORMAL LOW (ref 8.9–10.3)
Chloride: 109 mmol/L (ref 98–111)
Creatinine, Ser: 1.43 mg/dL — ABNORMAL HIGH (ref 0.61–1.24)
GFR calc Af Amer: 58 mL/min — ABNORMAL LOW (ref >60)
GFR calc non Af Amer: 50 mL/min — ABNORMAL LOW (ref >60)
Glucose, Bld: 82 mg/dL (ref 70–99)
Potassium: 4.5 mmol/L (ref 3.5–5.1)
Sodium: 140 mmol/L (ref 135–145)

## 2020-05-08 ENCOUNTER — Telehealth (HOSPITAL_COMMUNITY): Payer: Self-pay | Admitting: Cardiology

## 2020-05-08 ENCOUNTER — Other Ambulatory Visit (HOSPITAL_COMMUNITY): Payer: Self-pay | Admitting: Cardiology

## 2020-05-08 DIAGNOSIS — I5022 Chronic systolic (congestive) heart failure: Secondary | ICD-10-CM

## 2020-05-08 NOTE — Telephone Encounter (Signed)
-----   Message from Laurey Morale, MD sent at 05/06/2020 12:37 PM EDT ----- No changes for now, repeat BMET 2 wks as creatinine is up a bit.

## 2020-05-08 NOTE — Telephone Encounter (Signed)
Pt aware and voiced understanding  Orders placed

## 2020-05-22 ENCOUNTER — Other Ambulatory Visit: Payer: Self-pay

## 2020-05-22 ENCOUNTER — Ambulatory Visit (HOSPITAL_COMMUNITY)
Admission: RE | Admit: 2020-05-22 | Discharge: 2020-05-22 | Disposition: A | Discharge status: Home / Self Care | Payer: Medicaid Other | Source: Ambulatory Visit | Attending: Internal Medicine | Admitting: Internal Medicine

## 2020-05-22 DIAGNOSIS — I5022 Chronic systolic (congestive) heart failure: Secondary | ICD-10-CM | POA: Insufficient documentation

## 2020-05-22 LAB — BASIC METABOLIC PANEL
Anion gap: 8 (ref 5–15)
BUN: 35 mg/dL — ABNORMAL HIGH (ref 8–23)
CO2: 23 mmol/L (ref 22–32)
Calcium: 8.9 mg/dL (ref 8.9–10.3)
Chloride: 108 mmol/L (ref 98–111)
Creatinine, Ser: 1.7 mg/dL — ABNORMAL HIGH (ref 0.61–1.24)
GFR calc Af Amer: 47 mL/min — ABNORMAL LOW (ref >60)
GFR calc non Af Amer: 40 mL/min — ABNORMAL LOW (ref >60)
Glucose, Bld: 86 mg/dL (ref 70–99)
Potassium: 4.7 mmol/L (ref 3.5–5.1)
Sodium: 139 mmol/L (ref 135–145)

## 2020-05-27 ENCOUNTER — Telehealth (HOSPITAL_COMMUNITY): Payer: Self-pay | Admitting: *Deleted

## 2020-05-27 DIAGNOSIS — I5022 Chronic systolic (congestive) heart failure: Secondary | ICD-10-CM

## 2020-05-27 NOTE — Telephone Encounter (Signed)
Pt aware, agreeable, and verbalized understanding. Repeat lab sch for 7/6   Philicia Glade Lloyd, CMA  05/24/2020 3:59 PM EDT     Left message to return call   Philicia Glade Lloyd, CMA  05/23/2020 2:36 PM EDT     Left message to return call   Laurey Morale, MD  05/22/2020 5:46 PM EDT     Creatinine higher, increase po fluid intake and repeat BMET 2 WKS.

## 2020-06-04 ENCOUNTER — Ambulatory Visit (HOSPITAL_COMMUNITY)
Admission: RE | Admit: 2020-06-04 | Discharge: 2020-06-04 | Disposition: A | Discharge status: Home / Self Care | Payer: Medicaid Other | Source: Ambulatory Visit | Attending: Cardiology | Admitting: Cardiology

## 2020-06-04 ENCOUNTER — Other Ambulatory Visit: Payer: Self-pay

## 2020-06-04 ENCOUNTER — Ambulatory Visit (HOSPITAL_BASED_OUTPATIENT_CLINIC_OR_DEPARTMENT_OTHER): Payer: Medicaid Other

## 2020-06-04 DIAGNOSIS — F17200 Nicotine dependence, unspecified, uncomplicated: Secondary | ICD-10-CM | POA: Insufficient documentation

## 2020-06-04 DIAGNOSIS — I428 Other cardiomyopathies: Secondary | ICD-10-CM

## 2020-06-04 DIAGNOSIS — I2510 Atherosclerotic heart disease of native coronary artery without angina pectoris: Secondary | ICD-10-CM | POA: Diagnosis not present

## 2020-06-04 DIAGNOSIS — I110 Hypertensive heart disease with heart failure: Secondary | ICD-10-CM | POA: Insufficient documentation

## 2020-06-04 DIAGNOSIS — I4819 Other persistent atrial fibrillation: Secondary | ICD-10-CM | POA: Diagnosis not present

## 2020-06-04 DIAGNOSIS — I5022 Chronic systolic (congestive) heart failure: Secondary | ICD-10-CM | POA: Diagnosis not present

## 2020-06-04 LAB — BASIC METABOLIC PANEL
Anion gap: 9 (ref 5–15)
BUN: 41 mg/dL — ABNORMAL HIGH (ref 8–23)
CO2: 23 mmol/L (ref 22–32)
Calcium: 9.2 mg/dL (ref 8.9–10.3)
Chloride: 107 mmol/L (ref 98–111)
Creatinine, Ser: 1.69 mg/dL — ABNORMAL HIGH (ref 0.61–1.24)
GFR calc Af Amer: 47 mL/min — ABNORMAL LOW (ref >60)
GFR calc non Af Amer: 41 mL/min — ABNORMAL LOW (ref >60)
Glucose, Bld: 101 mg/dL — ABNORMAL HIGH (ref 70–99)
Potassium: 4.9 mmol/L (ref 3.5–5.1)
Sodium: 139 mmol/L (ref 135–145)

## 2020-06-10 ENCOUNTER — Telehealth: Payer: Medicaid Other | Admitting: Internal Medicine

## 2020-06-18 ENCOUNTER — Telehealth: Payer: Self-pay | Admitting: Internal Medicine

## 2020-06-18 NOTE — Telephone Encounter (Signed)
New Message ° ° °Patient is returning call in reference to echocardiogram results.  °

## 2020-06-18 NOTE — Telephone Encounter (Signed)
Called patient with echo results.

## 2020-06-28 ENCOUNTER — Other Ambulatory Visit: Payer: Self-pay | Admitting: Internal Medicine

## 2020-06-28 DIAGNOSIS — I4891 Unspecified atrial fibrillation: Secondary | ICD-10-CM

## 2020-07-02 ENCOUNTER — Encounter: Payer: Self-pay | Admitting: *Deleted

## 2020-07-02 ENCOUNTER — Telehealth: Payer: Self-pay

## 2020-07-02 NOTE — Telephone Encounter (Signed)
Left a message regarding virtual appt on 07/03/20. 

## 2020-07-03 ENCOUNTER — Telehealth (HOSPITAL_COMMUNITY): Payer: Self-pay | Admitting: *Deleted

## 2020-07-03 ENCOUNTER — Other Ambulatory Visit: Payer: Self-pay

## 2020-07-03 ENCOUNTER — Telehealth (INDEPENDENT_AMBULATORY_CARE_PROVIDER_SITE_OTHER): Payer: Medicaid Other | Admitting: Internal Medicine

## 2020-07-03 DIAGNOSIS — I4819 Other persistent atrial fibrillation: Secondary | ICD-10-CM

## 2020-07-03 DIAGNOSIS — D6869 Other thrombophilia: Secondary | ICD-10-CM

## 2020-07-03 DIAGNOSIS — I5022 Chronic systolic (congestive) heart failure: Secondary | ICD-10-CM

## 2020-07-03 NOTE — Progress Notes (Signed)
Electrophysiology TeleHealth Note  Due to national recommendations of social distancing due to COVID 19, an audio telehealth visit is felt to be most appropriate for this patient at this time.  Verbal consent was obtained by me for the telehealth visit today.  The patient does not have capability for a virtual visit.  A phone visit is therefore required today.   Date:  07/03/2020   ID:  Sean Barnett, DOB 1951/02/11, MRN [id number]  Location: patient's home  Provider location:  [place]  Evaluation Performed: Follow-up visit  PCP:  Theotis Barrio, MD   Electrophysiologist:  Dr Johney Frame  Chief Complaint:  palpitations  History of Present Illness:    Sean Barnett is a 69 y.o. male who presents via telehealth conferencing today.  Since last being seen in our clinic, the patient reports doing very well. He is very active.  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  He finds that in hot weather, he is dizzy.  He has had low BP (SBP 80s) in the hot weather.  No symptoms of afib post ablation. Today, he denies symptoms of palpitations, chest pain, shortness of breath,  lower extremity edema,  presyncope, or syncope.  The patient is otherwise without complaint today.     Past Medical History:  Diagnosis Date  . Bursitis of elbow 03/2019   left elbow  . DDD (degenerative disc disease), lumbar   . Dyspnea 03/30/2019  . Insomnia 03/30/2019  . Nonischemic cardiomyopathy (HCC)   . Nonobstructive CAD   . Persistent atrial fibrillation (HCC)   . Septic bursitis of elbow, left 04/13/2019    Past Surgical History:  Procedure Laterality Date  . ATRIAL FIBRILLATION ABLATION N/A 12/07/2019   Procedure: ATRIAL FIBRILLATION ABLATION;  Surgeon: Hillis Range, MD;  Location: [facility] INVASIVE CV LAB;  Service: Cardiovascular;  Laterality: N/A;  . CARDIOVERSION N/A 09/27/2019   Procedure: CARDIOVERSION;  Surgeon: Laurey Morale, MD;  Location: [facility] ENDOSCOPY;  Service: Cardiovascular;   Laterality: N/A;  . CARDIOVERSION N/A 11/30/2019   Procedure: CARDIOVERSION;  Surgeon: Laurey Morale, MD;  Location: [facility] ENDOSCOPY;  Service: Cardiovascular;  Laterality: N/A;  . DENTAL EXAMINATION UNDER ANESTHESIA    . RIGHT/LEFT HEART CATH AND CORONARY ANGIOGRAPHY N/A 09/06/2019   Procedure: RIGHT/LEFT HEART CATH AND CORONARY ANGIOGRAPHY;  Surgeon: Laurey Morale, MD;  Location: [facility] INVASIVE CV LAB;  Service: Cardiovascular;  Laterality: N/A;    Current Outpatient Medications  Medication Sig Dispense Refill  . ELIQUIS 5 MG TABS tablet TAKE 1 TABLET BY MOUTH TWICE A DAY 60 tablet 3  . carvedilol (COREG) 6.25 MG tablet Take 1 tablet (6.25 mg total) by mouth 2 (two) times daily with a meal. 60 tablet 5  . ENTRESTO 49-51 MG TAKE 1 TABLET BY MOUTH TWICE A DAY 180 tablet 3  . rosuvastatin (CRESTOR) 10 MG tablet TAKE 1 TABLET BY MOUTH EVERY DAY 90 tablet 3  . spironolactone (ALDACTONE) 25 MG tablet Take 1 tablet (25 mg total) by mouth every evening. 90 tablet 3   No current facility-administered medications for this visit.    Allergies:   Sulfa antibiotics   Social History:  The patient  reports that he has been smoking cigarettes. He has a 50.00 pack-year smoking history. He has never used smokeless tobacco. He reports current alcohol use of about 2.0 standard drinks of alcohol per week. He reports that he does not use drugs.   ROS:  Please see the history of present illness.  All other systems are personally reviewed and negative.    Exam:    Vital Signs:  There were no vitals taken for this visit.  Well sounding, alert and conversant   Labs/Other Tests and Data Reviewed:    Recent Labs: 08/29/2019: B Natriuretic Peptide 737.2 12/17/2019: Hemoglobin 13.3; Platelets 310 01/22/2020: ALT 24 03/25/2020: TSH 6.090 06/04/2020: BUN 41; Creatinine, Ser 1.69; Potassium 4.9; Sodium 139   Wt Readings from Last 3 Encounters:  04/25/20 244 lb (110.7 kg)  03/25/20 243 lb 12.8 oz (110.6 kg)    03/04/20 236 lb (107 kg)     ekg 5/21 reveals sinus rhyth Echo 7/21 reveals that his EF has recovered  ASSESSMENT & PLAN:    1.  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  I will reach out to Dr Shirlee Latch.   Risks, benefits and potential toxicities for medications prescribed and/or refilled reviewed with patient today.   Follow-up:  4 months with me in the office   Patient Risk:  after full review of this patients clinical status, I feel that they are at moderate risk at this time.  Today, I have spent 15 minutes with the patient with telehealth technology discussing arrhythmia management .    SignedHillis Range, MD  07/03/2020 2:42 PM     [facility] [address] [place] [address] [phone] (office) [phone] (fax)

## 2020-07-03 NOTE — Telephone Encounter (Signed)
-----   Message from Laurey Morale, MD sent at 07/03/2020  3:12 PM EDT ----- Let's have him cut his Entresto back to 24/26 bid instead of 49/51.   Will send to my office to call.  ----- Message ----- From: Hillis Range, MD Sent: 07/03/2020   2:45 PM EDT To: Laurey Morale, MD, Sampson Goon, RN  [place],  EF has recovered post ablation  He is having low BP (SBP 80s) with dizziness. Could be back off on any of his medicines?  He feels like with increased summer heat, symptoms of low BP have gotten worse. This morning he was outside tearing an old deck apart. Think we could stop spironolactone?  Let me know and I will pass on to him.

## 2020-07-03 NOTE — Telephone Encounter (Signed)
Left message to call back  

## 2020-07-04 MED ORDER — ENTRESTO 24-26 MG PO TABS: 1.0000 | ORAL_TABLET | Freq: Two times a day (BID) | ORAL | 11 refills | Status: DC

## 2020-07-04 NOTE — Telephone Encounter (Signed)
Patient advised and verbalized understanding. New Rx sent in

## 2020-07-04 NOTE — Telephone Encounter (Signed)
Meds ordered this encounter  Medications  . sacubitril-valsartan (ENTRESTO) 24-26 MG    Sig: Take 1 tablet by mouth 2 (two) times daily.    Dispense:  60 tablet    Refill:  11    Please cancel all previous orders for current medication. Change in dosage or pill size.

## 2020-08-23 ENCOUNTER — Encounter (HOSPITAL_COMMUNITY): Payer: Self-pay | Admitting: Cardiology

## 2020-08-23 ENCOUNTER — Other Ambulatory Visit: Payer: Self-pay

## 2020-08-23 ENCOUNTER — Ambulatory Visit (HOSPITAL_COMMUNITY)
Admission: RE | Admit: 2020-08-23 | Discharge: 2020-08-23 | Disposition: A | Discharge status: Home / Self Care | Payer: Medicaid Other | Source: Ambulatory Visit | Attending: Cardiology | Admitting: Cardiology

## 2020-08-23 ENCOUNTER — Ambulatory Visit (HOSPITAL_BASED_OUTPATIENT_CLINIC_OR_DEPARTMENT_OTHER)
Admission: RE | Admit: 2020-08-23 | Discharge: 2020-08-23 | Disposition: A | Discharge status: Home / Self Care | Payer: Medicaid Other | Source: Ambulatory Visit | Attending: Radiology | Admitting: Radiology

## 2020-08-23 VITALS — BP 112/62 | HR 52 | Ht 76.0 in | Wt 246.2 lb

## 2020-08-23 DIAGNOSIS — D6869 Other thrombophilia: Secondary | ICD-10-CM

## 2020-08-23 DIAGNOSIS — I110 Hypertensive heart disease with heart failure: Secondary | ICD-10-CM | POA: Diagnosis not present

## 2020-08-23 DIAGNOSIS — I5022 Chronic systolic (congestive) heart failure: Secondary | ICD-10-CM | POA: Insufficient documentation

## 2020-08-23 DIAGNOSIS — F17210 Nicotine dependence, cigarettes, uncomplicated: Secondary | ICD-10-CM | POA: Insufficient documentation

## 2020-08-23 DIAGNOSIS — Z7901 Long term (current) use of anticoagulants: Secondary | ICD-10-CM | POA: Diagnosis not present

## 2020-08-23 DIAGNOSIS — Z8249 Family history of ischemic heart disease and other diseases of the circulatory system: Secondary | ICD-10-CM | POA: Insufficient documentation

## 2020-08-23 DIAGNOSIS — Z79899 Other long term (current) drug therapy: Secondary | ICD-10-CM | POA: Diagnosis not present

## 2020-08-23 DIAGNOSIS — M25519 Pain in unspecified shoulder: Secondary | ICD-10-CM

## 2020-08-23 DIAGNOSIS — I428 Other cardiomyopathies: Secondary | ICD-10-CM | POA: Diagnosis not present

## 2020-08-23 DIAGNOSIS — M25511 Pain in right shoulder: Secondary | ICD-10-CM | POA: Insufficient documentation

## 2020-08-23 DIAGNOSIS — I480 Paroxysmal atrial fibrillation: Secondary | ICD-10-CM | POA: Diagnosis not present

## 2020-08-23 DIAGNOSIS — I2510 Atherosclerotic heart disease of native coronary artery without angina pectoris: Secondary | ICD-10-CM | POA: Insufficient documentation

## 2020-08-23 LAB — ECHOCARDIOGRAM COMPLETE
Area-P 1/2: 1.48 cm2
S' Lateral: 3.5 cm
Single Plane A2C EF: 58.1 %

## 2020-08-23 LAB — BASIC METABOLIC PANEL
Anion gap: 6 (ref 5–15)
BUN: 38 mg/dL — ABNORMAL HIGH (ref 8–23)
CO2: 20 mmol/L — ABNORMAL LOW (ref 22–32)
Calcium: 8.9 mg/dL (ref 8.9–10.3)
Chloride: 110 mmol/L (ref 98–111)
Creatinine, Ser: 1.9 mg/dL — ABNORMAL HIGH (ref 0.61–1.24)
GFR calc Af Amer: 41 mL/min — ABNORMAL LOW (ref >60)
GFR calc non Af Amer: 35 mL/min — ABNORMAL LOW (ref >60)
Glucose, Bld: 94 mg/dL (ref 70–99)
Potassium: 5 mmol/L (ref 3.5–5.1)
Sodium: 136 mmol/L (ref 135–145)

## 2020-08-23 LAB — CBC
HCT: 36.6 % — ABNORMAL LOW (ref 39.0–52.0)
Hemoglobin: 11.7 g/dL — ABNORMAL LOW (ref 13.0–17.0)
MCH: 31.4 pg (ref 26.0–34.0)
MCHC: 32 g/dL (ref 30.0–36.0)
MCV: 98.1 fL (ref 80.0–100.0)
Platelets: 249 10*3/uL (ref 150–400)
RBC: 3.73 MIL/uL — ABNORMAL LOW (ref 4.22–5.81)
RDW: 13.7 % (ref 11.5–15.5)
WBC: 9.8 10*3/uL (ref 4.0–10.5)
nRBC: 0 % (ref 0.0–0.2)

## 2020-08-23 LAB — LIPID PANEL
Cholesterol: 90 mg/dL (ref 0–200)
HDL: 24 mg/dL — ABNORMAL LOW (ref >40)
LDL Cholesterol: 46 mg/dL (ref 0–99)
Total CHOL/HDL Ratio: 3.8 RATIO
Triglycerides: 99 mg/dL (ref <150)
VLDL: 20 mg/dL (ref 0–40)

## 2020-08-23 NOTE — Patient Instructions (Addendum)
Labs done today, your results will be available in MyChart, we will contact you for abnormal readings.  You have been referred to Orthopedics. They will contact you to schedule an appointment.  Please call our office in March to schedule your follow up appointment   If you have any questions or concerns before your next appointment please send [place] a message through [place] or call our office at [phone].    TO LEAVE A MESSAGE FOR THE NURSE SELECT OPTION 2, PLEASE LEAVE A MESSAGE INCLUDING: . YOUR NAME . DATE OF BIRTH . CALL BACK NUMBER . REASON FOR CALL**this is important as we prioritize the call backs  YOU WILL RECEIVE A CALL BACK THE SAME DAY AS LONG AS YOU CALL BEFORE 4:00 PM  At the [facility], you and your health needs are our priority. As part of our continuing mission to provide you with exceptional heart care, we have created designated Provider Care Teams. These Care Teams include your primary Cardiologist (physician) and Advanced Practice Providers (APPs- Physician Assistants and Nurse Practitioners) who all work together to provide you with the care you need, when you need it.   You may see any of the following providers on your designated Care Team at your next follow up: Marland Kitchen Dr Arvilla Meres . Dr Marca Ancona . Tonye Becket, NP . Robbie Lis, PA . Karle Plumber, PharmD   Please be sure to bring in all your medications bottles to every appointment.

## 2020-08-23 NOTE — Progress Notes (Signed)
  Echocardiogram 2D Echocardiogram has been performed.  Delcie Roch 08/23/2020, 9:42 AM

## 2020-08-25 NOTE — Progress Notes (Signed)
PCP: Theotis Barrio, MD Cardiology: Dr. Shirlee Latch  69 y.o. with history of HTN and smoking was recently diagnosed with atrial fibrillation and chronic systolic CHF.  Patient was admitted in 4/20 with left elbow septic bursitis.  Several weeks prior to the admission, he had been feeling palpitations and had been tiring more easily.  At the time of admission, he reported chest pressure.  He was found to be in atrial fibrillation with RVR.  Echo was done, showing EF depressed to 30-35%.  He was started on Coreg and it was titrated up to control his HR.  He was started on Eliquis.  He was not cardioverted.   In 10/20, he had LHC/RHC showing moderate nonobstructive CAD, elevated PCWP, and CI 1.85.  He was started on digoxin.  Three weeks later after restarting apixaban he had had successful DCCV to NSR later in 10/20.   He had recurrent atrial fibrillation and failed DCCV in 12/20.  He then had atrial fibrillation ablation in 1/21.  He is now off amiodarone.   Cardiac MRI (1/21) with LV EF 47%, RV EF 46%, basal inferolateral and apical lateral LGE, looks like coronary disease pattern.   Echo was done today and reviewed, EF improved to 55-60%, normal RV.   He returns for followup of CHF and atrial fibrillation.  Weight is stable.  He continues to smoke, not interested in quitting.  Due to low BP and lightheadedness this summer, especially when working outside in the heat, he has decreased Entresto to 24/26 bid.  He continues to work as a Surveyor, minerals full time.  Main complaint today is concern for right rotator cuff partial tear.  He is unable to abduct his arm at the shoulder.  No dyspnea walking on flat ground. He does get short of breath with heavy lifting.  No palpitations or chest pain.  No orthopnea/PND.    ECG (personally reviewed): NSR, normal  Labs (5/20): LDL 65 Labs (8/20): K 4.5, creatinine 1.1, hgb 13.1 Labs (11/20): K 4, creatinine 1.08, hgb 15, digoxin 0.7 Labs (12/20): LDL 56 Labs (1/21): K  4.3, creatinine 1.2 Labs (2/21): K 4.4, creatinine 1.19 Labs (7/21): K 4.9, creatinine 1.69  PMH: 1. HTN 2. Atrial fibrillation: Paroxysmal. - DCCV to NSR in 10/20.  - Atrial fibrillation ablation 1/21.  3. Chronic systolic CHF: Nonischemic cardiomyopathy.  echo (4/20) with EF 30-35%, mildly decreased RV systolic function.  - LHC/RHC (10/20): 60% mid LAD, 40% PLOM, 50% dRCA; mean RA 2, PA 45/20, mean PCWP 22, CI 1.85, PVR 2.3 WU.  - Cardiac MRI (1/21): LV EF 47%, RV EF 46%, basal inferolateral and apical lateral LGE, looks like coronary disease pattern.  - Echo (9/21): EF 55-60% with normal RV.  4. H/o septic bursitis 5. CAD: LHC in 10/20 with moderate nonobstructive disease (see above).   SH: Works as Music therapist, widowed, smokes 1 ppd. Lives in [place].   FH: Father with lung cancer.  Brother with CABG in his 51s.   ROS: All systems reviewed and negative except as per HPI.   Current Outpatient Medications  Medication Sig Dispense Refill  . carvedilol (COREG) 6.25 MG tablet Take 1 tablet (6.25 mg total) by mouth 2 (two) times daily with a meal. 60 tablet 5  . ELIQUIS 5 MG TABS tablet TAKE 1 TABLET BY MOUTH TWICE A DAY 60 tablet 3  . rosuvastatin (CRESTOR) 10 MG tablet TAKE 1 TABLET BY MOUTH EVERY DAY 90 tablet 3  . sacubitril-valsartan (ENTRESTO) 24-26 MG Take 1  tablet by mouth 2 (two) times daily. 60 tablet 11  . spironolactone (ALDACTONE) 25 MG tablet Take 1 tablet (25 mg total) by mouth every evening. 90 tablet 3   No current facility-administered medications for this encounter.   BP 112/62 (BP Location: Left Arm, Patient Position: Sitting)   Pulse (!) 52   Ht 6\' 4"  (1.93 m)   Wt 111.7 kg (246 lb 3.2 oz)   SpO2 99%   BMI 29.97 kg/m  General: NAD Neck: No JVD, no thyromegaly or thyroid nodule.  Lungs: Mildly decreased BS bilaterally.  CV: Nondisplaced PMI.  Heart regular S1/S2, no S3/S4, no murmur.  No peripheral edema.  No carotid bruit.  Normal pedal pulses.  Abdomen:  Soft, nontender, no hepatosplenomegaly, no distention.  Skin: Intact without lesions or rashes.  Neurologic: Alert and oriented x 3.  Psych: Normal affect. Extremities: No clubbing or cyanosis.  HEENT: Normal.   Assessment/Plan: 1. Chronic systolic CHF: Echo in 4/20 with EF 30-35%, mildly decreased RV systolic function.  Nonischemic cardiomyopathy, RHC/LHC in 10/20 showed nonobstructive CAD and low output with CI 1.85.  It is possible that this is a tachycardia-mediated cardiomyopathy with EF down because of atrial fibrillation.  It is also possible that the cardiomyopathy is from some other cause and that the cardiomyopathy itself predisposed him to atrial fibrillation.  Cardiac MRI in 1/21 showed some improvement, with LV EF 47% and normal RV, there was coronary pattern LGE in the inferolateral wall.  Echo today showed EF up to 55-60% with normal RV.  NYHA class II.  He is not volume overloaded on exam.   - Continue Coreg 6.25 mg bid, dose decreased in the past due to bradycardia.   - Continue Entresto 24/26 bid, dose decreased in the past due to hypotension.   - Continue spironolactone 25 mg daily.  - BMET today.  2. Atrial fibrillation: Paroxysmal.  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  He had DCCV to NSR in 10/20 but afib recurred.  He had atrial fibrillation ablation in 1/21.  He is in NSR today.  Amiodarone was stopped post-ablation.   - Continue Eliquis 5 mg bid.  CBC today.  3. Smoking: I strongly recommended that he quit.  He is not ready.  4. HTN: BP tends to run low, Entresto cut back. Stable today without lightheadedness.  5. CAD: Moderate nonobstructive CAD on 10/20 cath.  - No ASA given apixaban use.  - Continue Crestor 10 mg daily, check lipids today.  6. Right shoulder pain: Concern for rotator cuff injury.  I will refer him for orthopedic evaluation.   With improvement in EF on today's echo, followup in 6 months but will need BMET in 3  months.   11/20 08/25/2020

## 2020-08-28 ENCOUNTER — Other Ambulatory Visit (HOSPITAL_COMMUNITY): Payer: Self-pay | Admitting: Cardiology

## 2020-08-28 DIAGNOSIS — I4891 Unspecified atrial fibrillation: Secondary | ICD-10-CM

## 2020-08-28 DIAGNOSIS — I5020 Unspecified systolic (congestive) heart failure: Secondary | ICD-10-CM

## 2020-08-29 ENCOUNTER — Telehealth (HOSPITAL_COMMUNITY): Payer: Self-pay

## 2020-08-29 DIAGNOSIS — I5022 Chronic systolic (congestive) heart failure: Secondary | ICD-10-CM

## 2020-08-29 MED ORDER — SPIRONOLACTONE 25 MG PO TABS
12.5000 mg | ORAL_TABLET | Freq: Every evening | ORAL | 0 refills | Status: DC
Start: 2020-08-29 — End: 2020-11-11

## 2020-08-29 NOTE — Telephone Encounter (Signed)
-----   Message from Laurey Morale, MD sent at 08/23/2020  4:14 PM EDT ----- Decrease spironolactone to 12.5 mg daily.  Increase po hydration. BMET again in 10 days.

## 2020-08-29 NOTE — Telephone Encounter (Signed)
Patient advised and verbalized understanding.lab appt scheduled,lab order entered,med list updated to reflect changes.  

## 2020-09-05 ENCOUNTER — Encounter: Payer: Self-pay | Admitting: Orthopedic Surgery

## 2020-09-05 ENCOUNTER — Telehealth: Payer: Self-pay | Admitting: Orthopedic Surgery

## 2020-09-05 ENCOUNTER — Ambulatory Visit (INDEPENDENT_AMBULATORY_CARE_PROVIDER_SITE_OTHER): Payer: Medicaid Other | Admitting: Orthopedic Surgery

## 2020-09-05 ENCOUNTER — Ambulatory Visit: Payer: Self-pay

## 2020-09-05 DIAGNOSIS — M25511 Pain in right shoulder: Secondary | ICD-10-CM | POA: Diagnosis not present

## 2020-09-05 DIAGNOSIS — G8929 Other chronic pain: Secondary | ICD-10-CM | POA: Diagnosis not present

## 2020-09-05 DIAGNOSIS — Z96611 Presence of right artificial shoulder joint: Secondary | ICD-10-CM | POA: Diagnosis not present

## 2020-09-05 NOTE — Telephone Encounter (Signed)
Pt called stating an MRI was put in for his Left shoulder but the issue is with the right shoulder so he would like for [place] to change the order

## 2020-09-05 NOTE — Progress Notes (Signed)
Office Visit Note   Patient: Sean Barnett           Date of Birth: November 28, 1951           MRN: [id number] Visit Date: 09/05/2020 Requested by: Laurey Morale, MD [phone] [place],  [place] [address] PCP: Theotis Barrio, MD  Subjective: Chief Complaint  Patient presents with  . Right Shoulder - Pain    HPI: Sean Barnett is a 69 year old patient with right shoulder pain.  Started a few months ago.  Actually became worse when he tried to take a hole with a postal digger in the bamboo field.  Reports primarily shoulder pain radiating to the elbow but no neck pain no radicular symptoms.  He still works as a Music therapist.  It is hard for him to lift and hold weighted objects above shoulder level.  Does not take any medication for the problem.  He is on Eliquis for heart rhythm issues.  Reports that his weakness is more problematic than pain.              ROS: All systems reviewed are negative as they relate to the chief complaint within the history of present illness.  Patient denies  fevers or chills.   Assessment & Plan: Visit Diagnoses:  1. Chronic right shoulder pain   2. Presence of right artificial shoulder joint     Plan: Impression is right shoulder pain with likely medium-size rotator cuff tear based on exam.  Whether or not he wants to have this fixed is really hard to say.  He does want to work 2 more years.  He does have multiple other medical comorbidities.  Plan MRI arthrogram to evaluate the size of the tear.  In general based on this exam now I think this is something he could conceivably live with.  We will see what the scan shows in make plans after that.  Follow-Up Instructions: Return for after MRI.   Orders:  Orders Placed This Encounter  Procedures  . XR Shoulder Right  . Arthrogram  . MR Shoulder Left w/ contrast   No orders of the defined types were placed in this encounter.     Procedures: No procedures performed   Clinical Data: No  additional findings.  Objective: Vital Signs: There were no vitals taken for this visit.  Physical Exam:   Constitutional: Patient appears well-developed HEENT:  Head: Normocephalic Eyes:EOM are normal Neck: Normal range of motion Cardiovascular: Normal rate Pulmonary/chest: Effort normal Neurologic: Patient is alert Skin: Skin is warm Psychiatric: Patient has normal mood and affect    Ortho Exam: Ortho exam demonstrates no restriction of external rotation of 15 degrees of abduction.  Does have a little bit of coarseness with internal extra rotation at 90 degrees abduction on the right shoulder which is not present on the left.  O'Brien's testing equivocal on the right.  Negative impingement signs.  No discrete AC joint tenderness right versus left.  Does have full passive forward flexion and abduction comparable to the left-hand side.  Specialty Comments:  No specialty comments available.  Imaging: XR Shoulder Right  Result Date: 09/05/2020 AP axillary outlet right shoulder reviewed.  Mild AC joint arthritis is present.  Acromiohumeral distance decreased to around 8 mm.  No glenohumeral joint arthritis.  Visualized lung fields clear.  No fracture or dislocation.    PMFS History: Patient Active Problem List   Diagnosis Date Noted  . Foot deformity, right 03/25/2020  .  Lower extremity numbness 03/25/2020  . Persistent atrial fibrillation (HCC) 12/20/2019  . Secondary hypercoagulable state (HCC) 12/20/2019  . Healthcare maintenance 07/11/2019  . Tobacco use 07/11/2019  . Hypertension 04/13/2019  . Systolic heart failure (HCC) 03/31/2019   Past Medical History:  Diagnosis Date  . Bursitis of elbow 03/2019   left elbow  . DDD (degenerative disc disease), lumbar   . Dyspnea 03/30/2019  . Insomnia 03/30/2019  . Nonischemic cardiomyopathy (HCC)   . Nonobstructive CAD   . Persistent atrial fibrillation (HCC)   . Septic bursitis of elbow, left 04/13/2019    Family History    Problem Relation Age of Onset  . Lung cancer Father     Past Surgical History:  Procedure Laterality Date  . ATRIAL FIBRILLATION ABLATION N/A 12/07/2019   Procedure: ATRIAL FIBRILLATION ABLATION;  Surgeon: Hillis Range, MD;  [facility]: [facility] INVASIVE CV LAB;  Service: Cardiovascular;  Laterality: N/A;  . CARDIOVERSION N/A 09/27/2019   Procedure: CARDIOVERSION;  Surgeon: Laurey Morale, MD;  Location: [facility] ENDOSCOPY;  Service: Cardiovascular;  Laterality: N/A;  . CARDIOVERSION N/A 11/30/2019   Procedure: CARDIOVERSION;  Surgeon: Laurey Morale, MD;  Location: [facility] ENDOSCOPY;  Service: Cardiovascular;  Laterality: N/A;  . DENTAL EXAMINATION UNDER ANESTHESIA    . RIGHT/LEFT HEART CATH AND CORONARY ANGIOGRAPHY N/A 09/06/2019   Procedure: RIGHT/LEFT HEART CATH AND CORONARY ANGIOGRAPHY;  Surgeon: Laurey Morale, MD;  Location: [facility] INVASIVE CV LAB;  Service: Cardiovascular;  Laterality: N/A;   Social History   Occupational History  . Not on file  Tobacco Use  . Smoking status: Current Every Day Smoker    Packs/day: 1.00    Years: 50.00    Pack years: 50.00    Types: Cigarettes  . Smokeless tobacco: Never Used  . Tobacco comment: trying to "cut down"  Vaping Use  . Vaping Use: Never used  Substance and Sexual Activity  . Alcohol use: Yes    Alcohol/week: 2.0 standard drinks    Types: 1 Cans of beer, 1 Standard drinks or equivalent per week    Comment: rare  . Drug use: Never  . Sexual activity: Not on file

## 2020-09-05 NOTE — Telephone Encounter (Signed)
I looked in ov notes to make sure the right shoulder was the one being discussed and then changed order to right shoulder.

## 2020-09-09 ENCOUNTER — Other Ambulatory Visit: Payer: Self-pay

## 2020-09-09 ENCOUNTER — Ambulatory Visit (HOSPITAL_COMMUNITY)
Admission: RE | Admit: 2020-09-09 | Discharge: 2020-09-09 | Disposition: A | Discharge status: Home / Self Care | Payer: Medicaid Other | Source: Ambulatory Visit | Attending: Internal Medicine | Admitting: Internal Medicine

## 2020-09-09 DIAGNOSIS — I5022 Chronic systolic (congestive) heart failure: Secondary | ICD-10-CM | POA: Insufficient documentation

## 2020-09-09 LAB — BASIC METABOLIC PANEL WITH GFR
Anion gap: 9 (ref 5–15)
BUN: 29 mg/dL — ABNORMAL HIGH (ref 8–23)
CO2: 22 mmol/L (ref 22–32)
Calcium: 9.1 mg/dL (ref 8.9–10.3)
Chloride: 107 mmol/L (ref 98–111)
Creatinine, Ser: 1.69 mg/dL — ABNORMAL HIGH (ref 0.61–1.24)
GFR, Estimated: 41 mL/min — ABNORMAL LOW
Glucose, Bld: 117 mg/dL — ABNORMAL HIGH (ref 70–99)
Potassium: 4.6 mmol/L (ref 3.5–5.1)
Sodium: 138 mmol/L (ref 135–145)

## 2020-09-25 ENCOUNTER — Ambulatory Visit
Admission: RE | Admit: 2020-09-25 | Discharge: 2020-09-25 | Disposition: A | Discharge status: Home / Self Care | Payer: Medicaid Other | Source: Ambulatory Visit | Attending: Orthopedic Surgery | Admitting: Orthopedic Surgery

## 2020-09-25 ENCOUNTER — Other Ambulatory Visit: Payer: Self-pay

## 2020-09-25 DIAGNOSIS — Z96611 Presence of right artificial shoulder joint: Secondary | ICD-10-CM

## 2020-09-25 DIAGNOSIS — G8929 Other chronic pain: Secondary | ICD-10-CM

## 2020-09-25 DIAGNOSIS — M25511 Pain in right shoulder: Secondary | ICD-10-CM | POA: Diagnosis not present

## 2020-09-25 DIAGNOSIS — M25611 Stiffness of right shoulder, not elsewhere classified: Secondary | ICD-10-CM | POA: Diagnosis not present

## 2020-09-25 MED ORDER — IOPAMIDOL (ISOVUE-M 200) INJECTION 41%
13.0000 mL | Freq: Once | INTRAMUSCULAR | Status: AC
  Administered 2020-09-25: 13 mL via INTRA_ARTICULAR

## 2020-10-07 ENCOUNTER — Telehealth (HOSPITAL_COMMUNITY): Payer: Self-pay | Admitting: Pharmacy Technician

## 2020-10-07 NOTE — Telephone Encounter (Signed)
Advanced Heart Failure Patient Advocate Encounter  Prior Authorization for Sherryll Burger has been approved.    PA# [id number] Effective dates: 10/07/20 through 10/07/21  Archer Asa, CPhT

## 2020-10-07 NOTE — Telephone Encounter (Signed)
Patient Advocate Encounter   Received notification from [facility] that prior authorization for Sherryll Burger is required.   PA submitted on CoverMyMeds Key [id number] Status is pending   Will continue to follow.

## 2020-10-09 ENCOUNTER — Ambulatory Visit (INDEPENDENT_AMBULATORY_CARE_PROVIDER_SITE_OTHER): Payer: Medicaid Other | Admitting: Orthopedic Surgery

## 2020-10-09 ENCOUNTER — Ambulatory Visit (INDEPENDENT_AMBULATORY_CARE_PROVIDER_SITE_OTHER): Payer: Medicaid Other

## 2020-10-09 DIAGNOSIS — M25571 Pain in right ankle and joints of right foot: Secondary | ICD-10-CM

## 2020-10-12 ENCOUNTER — Encounter: Payer: Self-pay | Admitting: Orthopedic Surgery

## 2020-10-12 NOTE — Progress Notes (Signed)
Office Visit Note   Patient: Sean Barnett           Date of Birth: 1950-12-24           MRN: [id number] Visit Date: 10/09/2020 Requested by: Theotis Barrio, MD [address] [place],  [place] [address] PCP: Theotis Barrio, MD  Subjective: Chief Complaint  Patient presents with  . scan review    HPI: Sean Barnett is a 69 year old patient here for MRI scan review.  He does have a small supraspinatus rotator cuff tear.  He is also injured his right ankle about a week ago.  Had an inversion injury and has been able to marginally weight-bear since that time.  His injury on the right shoulder started when he was lifting a miter box and felt a pull in his shoulder.  Overall his shoulder is better.  It is hard for him to hold his arm and lift things overhead but is something he can live with.  He has been using crutches for the right ankle.              ROS: All systems reviewed are negative as they relate to the chief complaint within the history of present illness.  Patient denies  fevers or chills.   Assessment & Plan: Visit Diagnoses:  1. Pain in right ankle and joints of right foot     Plan: Impression is right shoulder supraspinatus tear with not much retraction.  Functionally he is pretty good in terms of getting through his day.  Long-term I do not think that the rotator cuff pathology is compatible with home remodeling particularly a lot of lifting.  Nonetheless he wants to try the nonoperative route for now.  Regarding the right ankle the radiographs are negative for fracture but he does have significant soft tissue swelling.  Plan ASO for that for 2 to 3 weeks and then home directed rehab per patient request.  Follow-up with [place] as needed.  Follow-Up Instructions: Return if symptoms worsen or fail to improve.   Orders:  Orders Placed This Encounter  Procedures  . XR Ankle Complete Right   No orders of the defined types were placed in this encounter.     Procedures: No  procedures performed   Clinical Data: No additional findings.  Objective: Vital Signs: There were no vitals taken for this visit.  Physical Exam:   Constitutional: Patient appears well-developed HEENT:  Head: Normocephalic Eyes:EOM are normal Neck: Normal range of motion Cardiovascular: Normal rate Pulmonary/chest: Effort normal Neurologic: Patient is alert Skin: Skin is warm Psychiatric: Patient has normal mood and affect    Ortho Exam: Ortho exam demonstrates full active and passive range of motion of the left ankle.  Right ankle has some lateral swelling and tenderness but no medial sided swelling and tenderness.  Does have tenderness directly over the fibular metaphysis.  Palpable intact nontender anterior to posterior to peroneal and Achilles tendons.  No other masses lymphadenopathy or skin changes noted in that right ankle region.  Right shoulder is examined.  Has good active and passive range of motion with only slight supraspinatus weakness 5- out of 5 to testing.  Not much in the way of coarse grinding or crepitus in that right shoulder region.  Specialty Comments:  No specialty comments available.  Imaging: No results found.   PMFS History: Patient Active Problem List   Diagnosis Date Noted  . Foot deformity, right 03/25/2020  . Lower extremity numbness 03/25/2020  .  Persistent atrial fibrillation (HCC) 12/20/2019  . Secondary hypercoagulable state (HCC) 12/20/2019  . Healthcare maintenance 07/11/2019  . Tobacco use 07/11/2019  . Hypertension 04/13/2019  . Systolic heart failure (HCC) 03/31/2019   Past Medical History:  Diagnosis Date  . Bursitis of elbow 03/2019   left elbow  . DDD (degenerative disc disease), lumbar   . Dyspnea 03/30/2019  . Insomnia 03/30/2019  . Nonischemic cardiomyopathy (HCC)   . Nonobstructive CAD   . Persistent atrial fibrillation (HCC)   . Septic bursitis of elbow, left 04/13/2019    Family History  Problem Relation Age of  Onset  . Lung cancer Father     Past Surgical History:  Procedure Laterality Date  . ATRIAL FIBRILLATION ABLATION N/A 12/07/2019   Procedure: ATRIAL FIBRILLATION ABLATION;  Surgeon: Hillis Range, MD;  Location: [facility] INVASIVE CV LAB;  Service: Cardiovascular;  Laterality: N/A;  . CARDIOVERSION N/A 09/27/2019   Procedure: CARDIOVERSION;  Surgeon: Laurey Morale, MD;  Location: [facility] ENDOSCOPY;  Service: Cardiovascular;  Laterality: N/A;  . CARDIOVERSION N/A 11/30/2019   Procedure: CARDIOVERSION;  Surgeon: Laurey Morale, MD;  Location: [facility] ENDOSCOPY;  Service: Cardiovascular;  Laterality: N/A;  . DENTAL EXAMINATION UNDER ANESTHESIA    . RIGHT/LEFT HEART CATH AND CORONARY ANGIOGRAPHY N/A 09/06/2019   Procedure: RIGHT/LEFT HEART CATH AND CORONARY ANGIOGRAPHY;  Surgeon: Laurey Morale, MD;  Location: [facility] INVASIVE CV LAB;  Service: Cardiovascular;  Laterality: N/A;   Social History   Occupational History  . Not on file  Tobacco Use  . Smoking status: Current Every Day Smoker    Packs/day: 1.00    Years: 50.00    Pack years: 50.00    Types: Cigarettes  . Smokeless tobacco: Never Used  . Tobacco comment: trying to "cut down"  Vaping Use  . Vaping Use: Never used  Substance and Sexual Activity  . Alcohol use: Yes    Alcohol/week: 2.0 standard drinks    Types: 1 Cans of beer, 1 Standard drinks or equivalent per week    Comment: rare  . Drug use: Never  . Sexual activity: Not on file

## 2020-10-28 IMAGING — MR MR CARD MORPHOLOGY WO/W CM
32 series · 40 of 40 positions shown · IV contrast (gadavist)
Comparison: none

CLINICAL DATA: Cardiomyopathy of uncertain etiology

EXAM:
CARDIAC MRI
TECHNIQUE: The patient was scanned on a 1.5 Tesla GE magnet. A dedicated
cardiac coil was used. Functional imaging was done using Fiesta
sequences. [DATE], and 4 chamber views were done to assess for RWMA's.
Modified Bhan rule using a short axis stack was used to
calculate an ejection fraction on a dedicated work station using
Circle software. The patient received 8 cc of Gadavist. After 10
minutes inversion recovery sequences were used to assess for
infiltration and scar tissue.

[Series 6: bSSFP · oblique · 8.0mm · 1.61mm/px · 1 of 25 slices shown (1 of 20)]
[im 1/25]
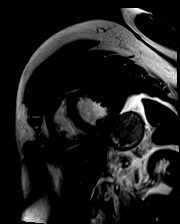

[Series 6: bSSFP · oblique · 8.0mm · 1.61mm/px · 1 of 25 slices shown (2 of 20)]
[im 1/25]
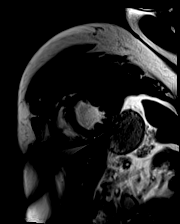

[Series 6: bSSFP · oblique · 8.0mm · 1.61mm/px · 1 of 25 slices shown (3 of 20)]
[im 1/25]
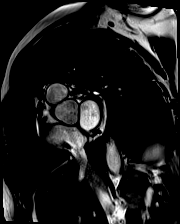

[Series 6: bSSFP · oblique · 8.0mm · 1.61mm/px · 2 of 25 slices shown (4 of 20)]
[im 1/25]
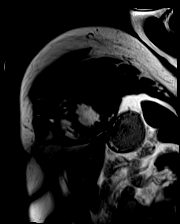
[im 25/25]
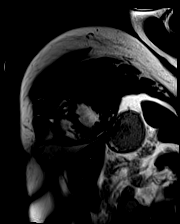

[Series 6: bSSFP · oblique · 8.0mm · 1.61mm/px · 1 of 25 slices shown (5 of 20)]
[im 1/25]
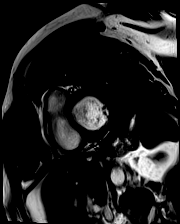

[Series 6: bSSFP · oblique · 8.0mm · 1.61mm/px · 1 of 25 slices shown (6 of 20)]
[im 1/25]
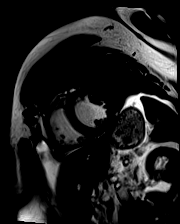

[Series 6: bSSFP · oblique · 8.0mm · 1.61mm/px · 2 of 25 slices shown (7 of 20)]
[im 1/25]
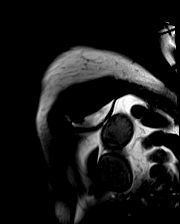
[im 25/25]
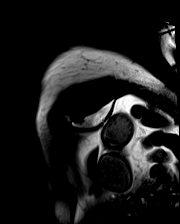

[Series 6: bSSFP · oblique · 8.0mm · 1.61mm/px · 2 of 25 slices shown (8 of 20)]
[im 1/25]
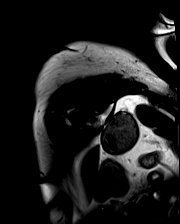
[im 25/25]
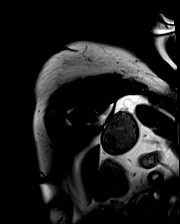

[Series 6: bSSFP · oblique · 8.0mm · 1.61mm/px · 1 of 25 slices shown (9 of 20)]
[im 1/25]
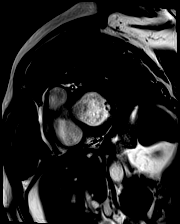

[Series 6: bSSFP · oblique · 8.0mm · 1.61mm/px · 1 of 25 slices shown (10 of 20)]
[im 1/25]
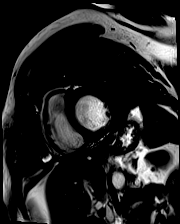

[Series 6: bSSFP · oblique · 8.0mm · 1.61mm/px · 2 of 25 slices shown (11 of 20)]
[im 1/25]
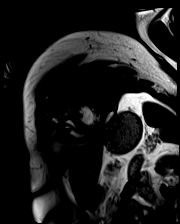
[im 25/25]
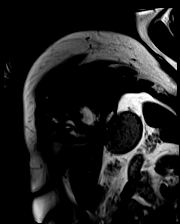

[Series 6: bSSFP · oblique · 8.0mm · 1.61mm/px · 2 of 25 slices shown (12 of 20)]
[im 1/25]
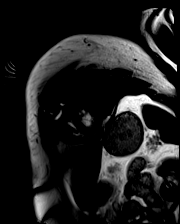
[im 25/25]
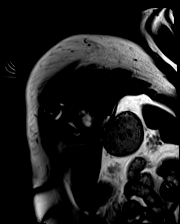

[Series 6: bSSFP · oblique · 8.0mm · 1.61mm/px · 1 of 25 slices shown (13 of 20)]
[im 1/25]
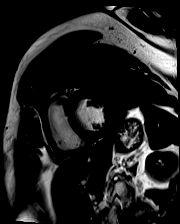

[Series 6: bSSFP · oblique · 8.0mm · 1.61mm/px · 1 of 25 slices shown (14 of 20)]
[im 1/25]
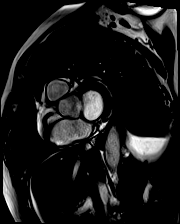

[Series 6: bSSFP · oblique · 8.0mm · 1.61mm/px · 1 of 25 slices shown (15 of 20)]
[im 1/25]
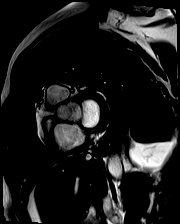

[Series 6: bSSFP · oblique · 8.0mm · 1.61mm/px · 1 of 25 slices shown (16 of 20)]
[im 1/25]
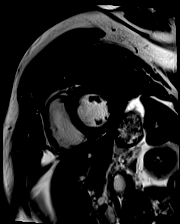

[Series 6: bSSFP · oblique · 8.0mm · 1.61mm/px · 2 of 25 slices shown (17 of 20)]
[im 1/25]
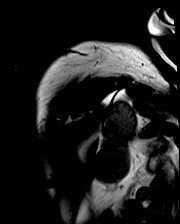
[im 25/25]
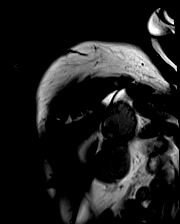

[Series 7: t2_stir_db_sax · oblique · 8.0mm · 1.73mm/px · 1 of 17 slices shown]
[im 1/17]
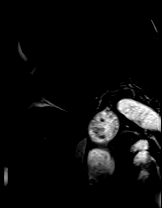

[Series 9: (id)_long_t1 · oblique · 8.0mm · 1.41mm/px · 1 of 24 slices shown]
[im 1/24]
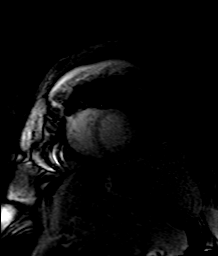

[Series 10: (id)_long_t1_moco · oblique · 8.0mm · 1.41mm/px · 1 of 24 slices shown]
[im 1/24]
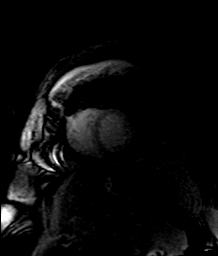

[Series 13: (id)_trufi · oblique · 8.0mm · 1.88mm/px · 1 of 9 slices shown]
[im 1/9]
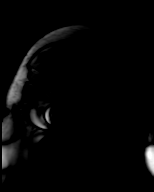

[Series 14: (id)_trufi_moco · oblique · 8.0mm · 1.88mm/px · 1 of 9 slices shown]
[im 1/9]
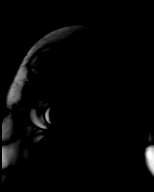

[Series 17: bSSFP · oblique · 6.0mm · 1.41mm/px · 1 of 25 slices shown (18 of 20)]
[im 1/25]
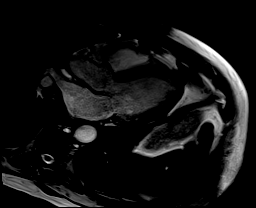

[Series 18: bSSFP · oblique · 6.0mm · 1.41mm/px · 1 of 25 slices shown (19 of 20)]
[im 1/25]
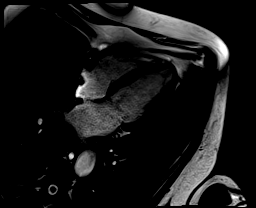

[Series 19: bSSFP · oblique · 6.0mm · 1.41mm/px · 1 of 25 slices shown (20 of 20)]
[im 1/25]
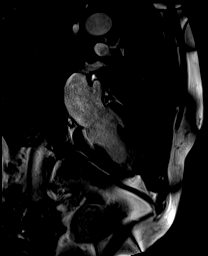

[Series 21: lge_single shot sa · oblique · 8.0mm · 1.98mm/px · 1 of 17 slices shown (1 of 2)]
[im 1/17]
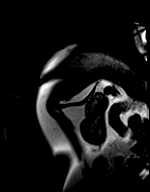

[Series 22: lge_single shot sa · oblique · 8.0mm · 1.98mm/px · 1 of 17 slices shown (2 of 2)]
[im 1/17]
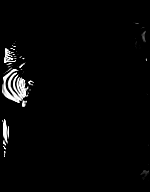

[Series 29: (id)_short_t1 · oblique · 8.0mm · 1.41mm/px · 2 of 27 slices shown]
[im 1/27]
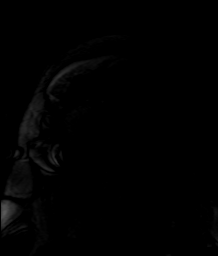
[im 27/27]
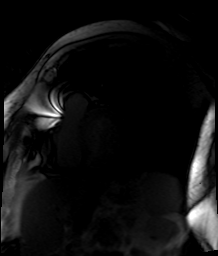

[Series 30: (id)_short_t1_moco · oblique · 8.0mm · 1.41mm/px · 2 of 27 slices shown]
[im 1/27]
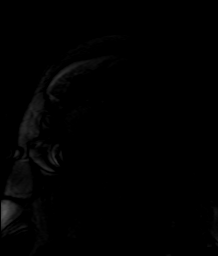
[im 27/27]
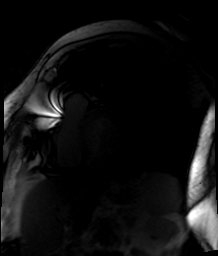

[Series 31: (id)_short_t1_moco_t1 · oblique · 8.0mm · 1.41mm/px · 1 of 6 slices shown]
[im 1/6]
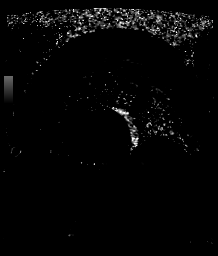

[Series 34: lge short axis_mag · oblique · 8.0mm · 1.61mm/px · 1 of 17 slices shown]
[im 1/17]
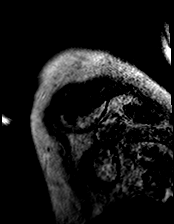

[Series 35: lge short axis_psir · oblique · 8.0mm · 1.61mm/px · 1 of 17 slices shown]
[im 1/17]
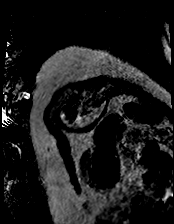

[40 of 40 positions shown; findings below may reference images not displayed]

FINDINGS: Limited images of the lung fields showed no gross abnormalities.

Normal left ventricular size with mild LV hypertrophy. Basal
inferolateral hypokinesis. Overall LV EF 47%. Normal RV size and
systolic function with EF 46%. Moderate left atrial dilation. Mild
right atrial dilation. No more than trivial mitral regurgitation but
flow sequences to quantify not done. Trileaflet aortic valve with no
regurgitation or stenosis.

On delayed enhancement imaging, there is late gadolinium enhancement
(LGE) in the basal inferolateral wall and a small area in the apical
lateral wall. The basal LGE looks like a mix of subendocardial and
mid-myocardial LGE. The apical lateral LGE appears to be
subendocardial.

Measurements:

LVEDV 182 mL

LVSV 85 mL

LVEF 47%

RVEDV 182 mL

RVSV 84 mL

RVEF 46%
IMPRESSION: 1. Normal LV size with mild LV hypertrophy, EF 47% with basal
inferolateral hypokinesis.

2.  Normal RV size and systolic function, EF 46%.

3. Basal inferolateral and apical lateral LGE noted. I think this
seems most likely related to coronary disease (primarily
subendocardial).

Jamlet Sofi Storozhenko Snoz

## 2020-11-03 ENCOUNTER — Other Ambulatory Visit: Payer: Self-pay | Admitting: Internal Medicine

## 2020-11-03 DIAGNOSIS — I4891 Unspecified atrial fibrillation: Secondary | ICD-10-CM

## 2020-11-11 ENCOUNTER — Other Ambulatory Visit: Payer: Self-pay

## 2020-11-11 ENCOUNTER — Encounter: Payer: Self-pay | Admitting: Internal Medicine

## 2020-11-11 ENCOUNTER — Telehealth (INDEPENDENT_AMBULATORY_CARE_PROVIDER_SITE_OTHER): Payer: Medicaid Other | Admitting: Internal Medicine

## 2020-11-11 ENCOUNTER — Telehealth: Payer: Self-pay | Admitting: *Deleted

## 2020-11-11 VITALS — BP 124/80 | HR 67

## 2020-11-11 DIAGNOSIS — I5022 Chronic systolic (congestive) heart failure: Secondary | ICD-10-CM

## 2020-11-11 DIAGNOSIS — I4819 Other persistent atrial fibrillation: Secondary | ICD-10-CM | POA: Diagnosis not present

## 2020-11-11 DIAGNOSIS — I10 Essential (primary) hypertension: Secondary | ICD-10-CM

## 2020-11-11 DIAGNOSIS — D6869 Other thrombophilia: Secondary | ICD-10-CM

## 2020-11-11 NOTE — Telephone Encounter (Signed)
Discontinued Spironolactone 12.5 mg daily.

## 2020-11-11 NOTE — Telephone Encounter (Signed)
-----   Message from Hillis Range, MD sent at 11/11/2020  9:44 AM EST ----- Stop spironolactone (pt aware)

## 2020-11-11 NOTE — Progress Notes (Signed)
Electrophysiology TeleHealth Note  Due to national recommendations of social distancing due to COVID 19, an audio telehealth visit is felt to be most appropriate for this patient at this time.  Verbal consent was obtained by me for the telehealth visit today.  The patient does not have capability for a virtual visit.  A phone visit is therefore required today.   Date:  11/11/2020   ID:  Sean Barnett, DOB 1951/08/05, MRN [id number]  Location: patient's home  Provider location:  [place]  Evaluation Performed: Follow-up visit  PCP:  Theotis Barrio, MD   Electrophysiologist:  Dr Johney Frame  Chief Complaint:  palpitations  History of Present Illness:    Sean Barnett is a 69 y.o. male who presents via telehealth conferencing today.  Since last being seen in our clinic, the patient reports doing very well.  afib has been well controlled.  Today, he denies symptoms of palpitations, chest pain, shortness of breath,  lower extremity edema, dizziness, presyncope, or syncope.  The patient is otherwise without complaint today.     Past Medical History:  Diagnosis Date  . Bursitis of elbow 03/2019   left elbow  . DDD (degenerative disc disease), lumbar   . Dyspnea 03/30/2019  . Insomnia 03/30/2019  . Nonischemic cardiomyopathy (HCC)   . Nonobstructive CAD   . Persistent atrial fibrillation (HCC)   . Septic bursitis of elbow, left 04/13/2019    Past Surgical History:  Procedure Laterality Date  . ATRIAL FIBRILLATION ABLATION N/A 12/07/2019   Procedure: ATRIAL FIBRILLATION ABLATION;  Surgeon: Hillis Range, MD;  Location: [facility] INVASIVE CV LAB;  Service: Cardiovascular;  Laterality: N/A;  . CARDIOVERSION N/A 09/27/2019   Procedure: CARDIOVERSION;  Surgeon: Laurey Morale, MD;  Location: [facility] ENDOSCOPY;  Service: Cardiovascular;  Laterality: N/A;  . CARDIOVERSION N/A 11/30/2019   Procedure: CARDIOVERSION;  Surgeon: Laurey Morale, MD;  Location: [facility] ENDOSCOPY;  Service: Cardiovascular;   Laterality: N/A;  . DENTAL EXAMINATION UNDER ANESTHESIA    . RIGHT/LEFT HEART CATH AND CORONARY ANGIOGRAPHY N/A 09/06/2019   Procedure: RIGHT/LEFT HEART CATH AND CORONARY ANGIOGRAPHY;  Surgeon: Laurey Morale, MD;  Location: [facility] INVASIVE CV LAB;  Service: Cardiovascular;  Laterality: N/A;    Current Outpatient Medications  Medication Sig Dispense Refill  . carvedilol (COREG) 6.25 MG tablet TAKE 1 TABLET (6.25 MG TOTAL) BY MOUTH 2 (TWO) TIMES DAILY WITH A MEAL. 180 tablet 3  . ELIQUIS 5 MG TABS tablet TAKE 1 TABLET BY MOUTH TWICE A DAY 60 tablet 5  . rosuvastatin (CRESTOR) 10 MG tablet TAKE 1 TABLET BY MOUTH EVERY DAY 90 tablet 3  . sacubitril-valsartan (ENTRESTO) 24-26 MG Take 1 tablet by mouth 2 (two) times daily. 60 tablet 11  . spironolactone (ALDACTONE) 25 MG tablet Take 0.5 tablets (12.5 mg total) by mouth every evening. 90 tablet 0   No current facility-administered medications for this visit.    Allergies:   Sulfa antibiotics   Social History:  The patient  reports that he has been smoking cigarettes. He has a 50.00 pack-year smoking history. He has never used smokeless tobacco. He reports current alcohol use of about 2.0 standard drinks of alcohol per week. He reports that he does not use drugs.   ROS:  Please see the history of present illness.   All other systems are personally reviewed and negative.    Exam:    Vital Signs:  BP 124/80   Pulse 67   Well sounding, alert and conversant  Labs/Other Tests and Data Reviewed:    Recent Labs: 01/22/2020: ALT 24 03/25/2020: TSH 6.090 08/23/2020: Hemoglobin 11.7; Platelets 249 09/09/2020: BUN 29; Creatinine, Ser 1.69; Potassium 4.6; Sodium 138   Wt Readings from Last 3 Encounters:  08/23/20 246 lb 3.2 oz (111.7 kg)  04/25/20 244 lb (110.7 kg)  03/25/20 243 lb 12.8 oz (110.6 kg)     ASSESSMENT & PLAN:    1.  Persistent afib Maintaining sinus rhythm post ablation off AAD therapy chads2vasc score is at least 2.  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 HTN Stable Stop spironolactone  4. HL Continue crestor  Risks, benefits and potential toxicities for medications prescribed and/or refilled reviewed with patient today.   Follow-up:  with me in 38months   Patient Risk:  after full review of this patients clinical status, I feel that they are at moderate risk at this time.  Today, I have spent 21 minutes with the patient with telehealth technology discussing arrhythmia management .    Randolm Idol, MD  11/11/2020 9:38 AM     [facility] [address] [place] [address] [phone] (office) [phone] (fax)

## 2020-11-13 ENCOUNTER — Ambulatory Visit: Payer: Medicaid Other | Admitting: Internal Medicine

## 2020-12-09 ENCOUNTER — Other Ambulatory Visit: Payer: Self-pay | Admitting: Internal Medicine

## 2020-12-09 DIAGNOSIS — I4819 Other persistent atrial fibrillation: Secondary | ICD-10-CM

## 2020-12-11 ENCOUNTER — Other Ambulatory Visit: Payer: Self-pay

## 2020-12-11 DIAGNOSIS — I4891 Unspecified atrial fibrillation: Secondary | ICD-10-CM

## 2020-12-11 MED ORDER — APIXABAN 5 MG PO TABS: 5.0000 mg | ORAL_TABLET | Freq: Two times a day (BID) | ORAL | 5 refills | Status: DC

## 2020-12-11 NOTE — Telephone Encounter (Signed)
Received following faxed message from pharmacy:    "Prescriber not covered under medicaid". Please resend RX. Thank you, SChaplin, RN,BSN

## 2021-03-14 ENCOUNTER — Ambulatory Visit (HOSPITAL_COMMUNITY)
Admission: RE | Admit: 2021-03-14 | Discharge: 2021-03-14 | Disposition: A | Discharge status: Home / Self Care | Payer: Medicaid Other | Source: Ambulatory Visit | Attending: Cardiology | Admitting: Cardiology

## 2021-03-14 ENCOUNTER — Encounter (HOSPITAL_COMMUNITY): Payer: Self-pay | Admitting: Cardiology

## 2021-03-14 ENCOUNTER — Other Ambulatory Visit: Payer: Self-pay

## 2021-03-14 VITALS — BP 130/70 | HR 56 | Wt 251.2 lb

## 2021-03-14 DIAGNOSIS — I480 Paroxysmal atrial fibrillation: Secondary | ICD-10-CM

## 2021-03-14 DIAGNOSIS — F17210 Nicotine dependence, cigarettes, uncomplicated: Secondary | ICD-10-CM | POA: Diagnosis not present

## 2021-03-14 DIAGNOSIS — I110 Hypertensive heart disease with heart failure: Secondary | ICD-10-CM | POA: Insufficient documentation

## 2021-03-14 DIAGNOSIS — I498 Other specified cardiac arrhythmias: Secondary | ICD-10-CM | POA: Diagnosis not present

## 2021-03-14 DIAGNOSIS — Z79899 Other long term (current) drug therapy: Secondary | ICD-10-CM | POA: Insufficient documentation

## 2021-03-14 DIAGNOSIS — I5022 Chronic systolic (congestive) heart failure: Secondary | ICD-10-CM | POA: Diagnosis not present

## 2021-03-14 DIAGNOSIS — I428 Other cardiomyopathies: Secondary | ICD-10-CM | POA: Insufficient documentation

## 2021-03-14 DIAGNOSIS — I2510 Atherosclerotic heart disease of native coronary artery without angina pectoris: Secondary | ICD-10-CM | POA: Diagnosis not present

## 2021-03-14 DIAGNOSIS — Z7901 Long term (current) use of anticoagulants: Secondary | ICD-10-CM | POA: Insufficient documentation

## 2021-03-14 HISTORY — DX: Heart failure, unspecified: I50.9

## 2021-03-14 LAB — CBC
HCT: 37.7 % — ABNORMAL LOW (ref 39.0–52.0)
Hemoglobin: 12.2 g/dL — ABNORMAL LOW (ref 13.0–17.0)
MCH: 30.2 pg (ref 26.0–34.0)
MCHC: 32.4 g/dL (ref 30.0–36.0)
MCV: 93.3 fL (ref 80.0–100.0)
Platelets: 248 10*3/uL (ref 150–400)
RBC: 4.04 MIL/uL — ABNORMAL LOW (ref 4.22–5.81)
RDW: 14.6 % (ref 11.5–15.5)
WBC: 8.3 10*3/uL (ref 4.0–10.5)
nRBC: 0 % (ref 0.0–0.2)

## 2021-03-14 LAB — BASIC METABOLIC PANEL
Anion gap: 6 (ref 5–15)
BUN: 27 mg/dL — ABNORMAL HIGH (ref 8–23)
CO2: 24 mmol/L (ref 22–32)
Calcium: 9.1 mg/dL (ref 8.9–10.3)
Chloride: 107 mmol/L (ref 98–111)
Creatinine, Ser: 1.47 mg/dL — ABNORMAL HIGH (ref 0.61–1.24)
GFR, Estimated: 51 mL/min — ABNORMAL LOW (ref >60)
Glucose, Bld: 124 mg/dL — ABNORMAL HIGH (ref 70–99)
Potassium: 4.7 mmol/L (ref 3.5–5.1)
Sodium: 137 mmol/L (ref 135–145)

## 2021-03-14 NOTE — Patient Instructions (Signed)
Continue current medications  Labs done today, we will call you for abnormal results  Please call our office in September to schedule your follow up appointment  If you have any questions or concerns before your next appointment please send [place] a message through [place] or call our office at [phone].    TO LEAVE A MESSAGE FOR THE NURSE SELECT OPTION 2, PLEASE LEAVE A MESSAGE INCLUDING: . YOUR NAME . DATE OF BIRTH . CALL BACK NUMBER . REASON FOR CALL**this is important as we prioritize the call backs  YOU WILL RECEIVE A CALL BACK THE SAME DAY AS LONG AS YOU CALL BEFORE 4:00 PM  At the [facility], you and your health needs are our priority. As part of our continuing mission to provide you with exceptional heart care, we have created designated Provider Care Teams. These Care Teams include your primary Cardiologist (physician) and Advanced Practice Providers (APPs- Physician Assistants and Nurse Practitioners) who all work together to provide you with the care you need, when you need it.   You may see any of the following providers on your designated Care Team at your next follow up: Marland Kitchen Dr Arvilla Meres . Dr Marca Ancona . Dr Thornell Mule . Tonye Becket, NP . Robbie Lis, PA . Shanda Bumps [place],NP . Karle Plumber, PharmD   Please be sure to bring in all your medications bottles to every appointment.

## 2021-03-16 NOTE — Progress Notes (Signed)
PCP: Theotis Barrio, MD Cardiology: Dr. Shirlee Latch  70 y.o. with history of HTN and smoking was recently diagnosed with atrial fibrillation and chronic systolic CHF.  Patient was admitted in 4/20 with left elbow septic bursitis.  Several weeks prior to the admission, he had been feeling palpitations and had been tiring more easily.  At the time of admission, he reported chest pressure.  He was found to be in atrial fibrillation with RVR.  Echo was done, showing EF depressed to 30-35%.  He was started on Coreg and it was titrated up to control his HR.  He was started on Eliquis.  He was not cardioverted.   In 10/20, he had LHC/RHC showing moderate nonobstructive CAD, elevated PCWP, and CI 1.85.  He was started on digoxin.  Three weeks later after restarting apixaban he had had successful DCCV to NSR later in 10/20.   He had recurrent atrial fibrillation and failed DCCV in 12/20.  He then had atrial fibrillation ablation in 1/21.  He is now off amiodarone.   Cardiac MRI (1/21) with LV EF 47%, RV EF 46%, basal inferolateral and apical lateral LGE, looks like coronary disease pattern.   Echo in 9/21 showed EF improved to 55-60%, normal RV.   He returns for followup of CHF and atrial fibrillation.  He is still smoking, not interested in quitting.  He remains in NSR today.  Rare palpitations.  Rare lightheadedness if he stands too fast.  Dyspnea only with heavy exertion (carrying a heavy load).  Still doing carpentry work full-time.  Limited only by his torn left rotator cuff, contemplating surgery.  He is no longer taking spironolactone.    ECG (personally reviewed): NSR with PAC  Labs (5/20): LDL 65 Labs (8/20): K 4.5, creatinine 1.1, hgb 13.1 Labs (11/20): K 4, creatinine 1.08, hgb 15, digoxin 0.7 Labs (12/20): LDL 56 Labs (1/21): K 4.3, creatinine 1.2 Labs (2/21): K 4.4, creatinine 1.19 Labs (7/21): K 4.9, creatinine 1.69 Labs (10/21): K 4.6, creatinine 1.69  PMH: 1. HTN 2. Atrial fibrillation:  Paroxysmal. - DCCV to NSR in 10/20.  - Atrial fibrillation ablation 1/21.  3. Chronic systolic CHF: Nonischemic cardiomyopathy.  echo (4/20) with EF 30-35%, mildly decreased RV systolic function.  - LHC/RHC (10/20): 60% mid LAD, 40% PLOM, 50% dRCA; mean RA 2, PA 45/20, mean PCWP 22, CI 1.85, PVR 2.3 WU.  - Cardiac MRI (1/21): LV EF 47%, RV EF 46%, basal inferolateral and apical lateral LGE, looks like coronary disease pattern.  - Echo (9/21): EF 55-60% with normal RV.  4. H/o septic bursitis 5. CAD: LHC in 10/20 with moderate nonobstructive disease (see above).   SH: Works as Music therapist, widowed, smokes 1 ppd. Lives in [place].   FH: Father with lung cancer.  Brother with CABG in his 52s.   ROS: All systems reviewed and negative except as per HPI.   Current Outpatient Medications  Medication Sig Dispense Refill  . apixaban (ELIQUIS) 5 MG TABS tablet Take 1 tablet (5 mg total) by mouth 2 (two) times daily. 60 tablet 5  . carvedilol (COREG) 6.25 MG tablet TAKE 1 TABLET (6.25 MG TOTAL) BY MOUTH 2 (TWO) TIMES DAILY WITH A MEAL. 180 tablet 3  . rosuvastatin (CRESTOR) 10 MG tablet TAKE 1 TABLET BY MOUTH EVERY DAY 90 tablet 3  . sacubitril-valsartan (ENTRESTO) 24-26 MG Take 1 tablet by mouth 2 (two) times daily. 60 tablet 11   No current facility-administered medications for this encounter.   BP 130/70  Pulse (!) 56   Wt 113.9 kg (251 lb 3.2 oz)   SpO2 97%   BMI 30.58 kg/m  General: NAD Neck: No JVD, no thyromegaly or thyroid nodule.  Lungs: Clear to auscultation bilaterally with normal respiratory effort. CV: Nondisplaced PMI.  Heart regular S1/S2, no S3/S4, no murmur.  No peripheral edema.  No carotid bruit.  Normal pedal pulses.  Abdomen: Soft, nontender, no hepatosplenomegaly, no distention.  Skin: Intact without lesions or rashes.  Neurologic: Alert and oriented x 3.  Psych: Normal affect. Extremities: No clubbing or cyanosis.  HEENT: Normal.   Assessment/Plan: 1. Chronic  systolic CHF: Echo in 4/20 with EF 30-35%, mildly decreased RV systolic function.  Nonischemic cardiomyopathy, RHC/LHC in 10/20 showed nonobstructive CAD and low output with CI 1.85.  It is possible that this is a tachycardia-mediated cardiomyopathy with EF down because of atrial fibrillation.  It is also possible that the cardiomyopathy is from some other cause and that the cardiomyopathy itself predisposed him to atrial fibrillation.  Cardiac MRI in 1/21 showed some improvement, with LV EF 47% and normal RV, there was coronary pattern LGE in the inferolateral wall.  Echo in 9/21 showed EF up to 55-60% with normal RV.  NYHA class II.  He is not volume overloaded on exam.   - Continue Coreg 6.25 mg bid, dose decreased in the past due to bradycardia.   - Continue Entresto 24/26 bid, dose decreased in the past due to hypotension.   - He is off spironolactone, with improved EF, I think he can stay off (but would continue Entresto and Coreg).   - BMET today.  2. Atrial fibrillation: Paroxysmal.  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  He had DCCV to NSR in 10/20 but afib recurred.  He had atrial fibrillation ablation in 1/21.  He is in NSR today.  Amiodarone was stopped post-ablation.   - Continue Eliquis 5 mg bid.  CBC today.  3. Smoking: I strongly recommended that he quit.  He is not ready.  4. HTN: BP controlled.   5. CAD: Moderate nonobstructive CAD on 10/20 cath.  - No ASA given apixaban use.  - Continue Crestor 10 mg daily, good LDL in 9/21.   Followup in 6 months.   Marca Ancona 03/16/2021

## 2021-04-07 ENCOUNTER — Other Ambulatory Visit (HOSPITAL_COMMUNITY): Payer: Self-pay | Admitting: Cardiology

## 2021-04-19 ENCOUNTER — Encounter: Payer: Self-pay | Admitting: *Deleted

## 2021-06-18 ENCOUNTER — Other Ambulatory Visit: Payer: Self-pay | Admitting: Internal Medicine

## 2021-06-18 DIAGNOSIS — I4891 Unspecified atrial fibrillation: Secondary | ICD-10-CM

## 2021-06-20 ENCOUNTER — Encounter: Payer: Self-pay | Admitting: Internal Medicine

## 2021-06-20 NOTE — Telephone Encounter (Signed)
Patient last office visit was in April 2021.  Mailed letter asking him to please contact office to get an appointment scheduled.

## 2021-07-08 ENCOUNTER — Ambulatory Visit: Payer: Medicaid Other | Admitting: Student

## 2021-07-08 ENCOUNTER — Other Ambulatory Visit: Payer: Self-pay

## 2021-07-08 ENCOUNTER — Encounter: Payer: Self-pay | Admitting: Student

## 2021-07-08 VITALS — BP 124/67 | HR 52 | Temp 97.8°F | Ht 76.0 in | Wt 249.6 lb

## 2021-07-08 DIAGNOSIS — Z0000 Encounter for general adult medical examination without abnormal findings: Secondary | ICD-10-CM

## 2021-07-08 DIAGNOSIS — I5020 Unspecified systolic (congestive) heart failure: Secondary | ICD-10-CM

## 2021-07-08 DIAGNOSIS — I10 Essential (primary) hypertension: Secondary | ICD-10-CM

## 2021-07-08 DIAGNOSIS — R7303 Prediabetes: Secondary | ICD-10-CM | POA: Insufficient documentation

## 2021-07-08 DIAGNOSIS — I482 Chronic atrial fibrillation, unspecified: Secondary | ICD-10-CM | POA: Diagnosis not present

## 2021-07-08 DIAGNOSIS — I4819 Other persistent atrial fibrillation: Secondary | ICD-10-CM

## 2021-07-08 LAB — POCT GLYCOSYLATED HEMOGLOBIN (HGB A1C): Hemoglobin A1C: 5.8 % — AB (ref 4.0–5.6)

## 2021-07-08 LAB — GLUCOSE, CAPILLARY: Glucose-Capillary: 111 mg/dL — ABNORMAL HIGH (ref 70–99)

## 2021-07-08 MED ORDER — APIXABAN 5 MG PO TABS: 5.0000 mg | ORAL_TABLET | Freq: Two times a day (BID) | ORAL | 5 refills | Status: DC

## 2021-07-08 NOTE — Assessment & Plan Note (Signed)
History of HFrEF likely secondary to A. fib versus CAD.  Last echocardiogram on 07/2020 show EF of 55-60%.  Patient reports feeling well.  Denies chest pain, shortness of breath, LE edema, orthopnea.  Report adherence to his medications.  Last BMP in April 2022 showed stable creatinine 1.47.  Last saw cardiology in April.  -Continue Coreg 6.25 mg twice daily -Continue Entresto 24/26 mg twice daily -Follow-up with cardiology

## 2021-07-08 NOTE — Assessment & Plan Note (Signed)
-   Patient declined colonoscopy or FIT test.  States that he is not worried at his age.  Denies melena or blood in stool.  Endorses constipation, which he contributes to his medication. -Also declined pneumonia vaccine

## 2021-07-08 NOTE — Progress Notes (Deleted)
   CC: ***  HPI:[name] is a 69 y.o. male who presents for evaluation of ***. Please see individual problem based A/P for details.  CHF - entresto 24-26 - Carvedilol 6.25mg   HLD - rosuvastatin 29m  Persistent afib - eliquis 5  Health maintenance - colonoscopy?  Depression, PHQ-9: Based on the patients  Flowsheet Row Office Visit from 07/11/2019 in [facility]  PHQ-9 Total Score 5      score we have ***.  Past Medical History:  Diagnosis Date   Bursitis of elbow 03/2019   left elbow   CHF (congestive heart failure) (HCC)    DDD (degenerative disc disease), lumbar    Dyspnea 03/30/2019   Insomnia 03/30/2019   Nonischemic cardiomyopathy (HCC)    Nonobstructive CAD    Persistent atrial fibrillation (HCC)    Septic bursitis of elbow, left 04/13/2019   Review of Systems:   ROS   Physical Exam: There were no vitals filed for this visit.   General: *** HEENT: Conjunctiva nl , antiicteric sclerae, moist mucous membranes, no exudate or erythema Cardiovascular: Normal rate, regular rhythm.  No murmurs, rubs, or gallops Pulmonary : Equal breath sounds, No wheezes, rales, or rhonchi Abdominal: soft, nontender,  bowel sounds present Ext: No edema in lower extremities, no tenderness to palpation of lower extremities.   Assessment & Plan:   See Encounters Tab for problem based charting.  Patient {GC/GE:[id number]::"discussed with","seen with"} Dr. {YHCWC:[id number]::"D. Hoffman","Guilloud","Mullen","Narendra","Raines","Vincent","Williams"}

## 2021-07-08 NOTE — Assessment & Plan Note (Signed)
Last A1c 5.81-year ago  -Check A1c

## 2021-07-08 NOTE — Patient Instructions (Signed)
Sean Barnett,  It was a pleasure seeing you in the clinic today.  Here is a summary of what we talked about:  1.  Heart failure: I am glad that you are doing well.  Please continue your medication as instructed.  2.  Atrial fibrillation: I sent a refill of your Eliquis to the pharmacy.  3.  Prediabetes: I will check A1c today.  Please return in 6 months, sooner if needed  Take care,  Dr. Cyndie Chime

## 2021-07-08 NOTE — Assessment & Plan Note (Addendum)
Status post DCCV 12/2019.  Currently bradycardic but regular rhythm.  His pulse 52 today.  Patient denies dizziness, lightheadedness or shortness of breath.  Last EKG in April did not show any heart block.  -Continue Coreg -Refill Eliquis -Consider EKG if becomes symptomatic

## 2021-07-08 NOTE — Progress Notes (Signed)
   CC: Routine checkup.  Medication refill  HPI:  [name] is a 70 y.o. with past medical history of HFpEF, hypertension, atrial fibrillation, who presented to the clinic for medication refill.  He has no complaint.  Please see problem based charting for detail  Past Medical History:  Diagnosis Date   Bursitis of elbow 03/2019   left elbow   CHF (congestive heart failure) (HCC)    DDD (degenerative disc disease), lumbar    Dyspnea 03/30/2019   Insomnia 03/30/2019   Nonischemic cardiomyopathy (HCC)    Nonobstructive CAD    Persistent atrial fibrillation (HCC)    Septic bursitis of elbow, left 04/13/2019   Review of Systems:  per HPI  Physical Exam:  Vitals:   07/08/21 1120  BP: 124/67  Pulse: (!) 52  Temp: 97.8 F (36.6 C)  TempSrc: Oral  SpO2: 97%  Weight: 249 lb 9.6 oz (113.2 kg)  Height: 6\' 4"  (1.93 m)   Physical Exam Constitutional:      General: He is not in acute distress.    Appearance: He is not toxic-appearing.  HENT:     Head: Normocephalic.  Eyes:     Conjunctiva/sclera: Conjunctivae normal.  Cardiovascular:     Rate and Rhythm: Regular rhythm. Bradycardia present.     Heart sounds: Normal heart sounds.     Comments: No JVD Trace edema bilateral LE Pulmonary:     Effort: Pulmonary effort is normal. No respiratory distress.     Breath sounds: Normal breath sounds. No wheezing.  Musculoskeletal:        General: Normal range of motion.     Cervical back: Normal range of motion.  Skin:    General: Skin is warm.     Coloration: Skin is not jaundiced.  Neurological:     Mental Status: He is alert and oriented to person, place, and time.  Psychiatric:        Mood and Affect: Mood normal.        Behavior: Behavior normal.     Assessment & Plan:   See Encounters Tab for problem based charting.  Patient discussed with Dr. 

## 2021-07-08 NOTE — Assessment & Plan Note (Signed)
Blood pressure well controlled. Continue current regimen.  

## 2021-07-11 NOTE — Progress Notes (Signed)
Internal Medicine Clinic Attending  Case discussed with Dr. Nguyen  At the time of the visit.  We reviewed the resident's history and exam and pertinent patient test results.  I agree with the assessment, diagnosis, and plan of care documented in the resident's note. 

## 2021-07-14 ENCOUNTER — Other Ambulatory Visit (HOSPITAL_COMMUNITY): Payer: Self-pay | Admitting: Cardiology

## 2021-09-08 ENCOUNTER — Other Ambulatory Visit: Payer: Self-pay

## 2021-09-08 ENCOUNTER — Encounter (HOSPITAL_COMMUNITY): Payer: Self-pay | Admitting: Cardiology

## 2021-09-08 ENCOUNTER — Ambulatory Visit (HOSPITAL_COMMUNITY)
Admission: RE | Admit: 2021-09-08 | Discharge: 2021-09-08 | Disposition: A | Discharge status: Home / Self Care | Payer: Medicaid Other | Source: Ambulatory Visit | Attending: Cardiology | Admitting: Cardiology

## 2021-09-08 VITALS — BP 138/78 | HR 53 | Wt 241.8 lb

## 2021-09-08 DIAGNOSIS — F17210 Nicotine dependence, cigarettes, uncomplicated: Secondary | ICD-10-CM | POA: Insufficient documentation

## 2021-09-08 DIAGNOSIS — E785 Hyperlipidemia, unspecified: Secondary | ICD-10-CM | POA: Diagnosis not present

## 2021-09-08 DIAGNOSIS — Z7901 Long term (current) use of anticoagulants: Secondary | ICD-10-CM | POA: Diagnosis not present

## 2021-09-08 DIAGNOSIS — N183 Chronic kidney disease, stage 3 unspecified: Secondary | ICD-10-CM | POA: Diagnosis not present

## 2021-09-08 DIAGNOSIS — I2510 Atherosclerotic heart disease of native coronary artery without angina pectoris: Secondary | ICD-10-CM | POA: Diagnosis not present

## 2021-09-08 DIAGNOSIS — I428 Other cardiomyopathies: Secondary | ICD-10-CM | POA: Insufficient documentation

## 2021-09-08 DIAGNOSIS — I5022 Chronic systolic (congestive) heart failure: Secondary | ICD-10-CM | POA: Insufficient documentation

## 2021-09-08 DIAGNOSIS — I480 Paroxysmal atrial fibrillation: Secondary | ICD-10-CM | POA: Diagnosis not present

## 2021-09-08 DIAGNOSIS — Z79899 Other long term (current) drug therapy: Secondary | ICD-10-CM | POA: Insufficient documentation

## 2021-09-08 DIAGNOSIS — I130 Hypertensive heart and chronic kidney disease with heart failure and stage 1 through stage 4 chronic kidney disease, or unspecified chronic kidney disease: Secondary | ICD-10-CM | POA: Diagnosis not present

## 2021-09-08 LAB — LIPID PANEL
Cholesterol: 83 mg/dL (ref 0–200)
HDL: 30 mg/dL — ABNORMAL LOW (ref >40)
LDL Cholesterol: 44 mg/dL (ref 0–99)
Total CHOL/HDL Ratio: 2.8 RATIO
Triglycerides: 47 mg/dL (ref <150)
VLDL: 9 mg/dL (ref 0–40)

## 2021-09-08 LAB — BASIC METABOLIC PANEL
Anion gap: 7 (ref 5–15)
BUN: 21 mg/dL (ref 8–23)
CO2: 24 mmol/L (ref 22–32)
Calcium: 8.8 mg/dL — ABNORMAL LOW (ref 8.9–10.3)
Chloride: 106 mmol/L (ref 98–111)
Creatinine, Ser: 1.49 mg/dL — ABNORMAL HIGH (ref 0.61–1.24)
GFR, Estimated: 50 mL/min — ABNORMAL LOW (ref >60)
Glucose, Bld: 106 mg/dL — ABNORMAL HIGH (ref 70–99)
Potassium: 4.3 mmol/L (ref 3.5–5.1)
Sodium: 137 mmol/L (ref 135–145)

## 2021-09-08 NOTE — Patient Instructions (Signed)
EKG done today.  Labs done today. We will contact you only if your labs are abnormal.  No medication changes were made. Please continue all current medications as prescribed.  Your physician recommends that you schedule a follow-up appointment in: 6 months with an echo prior to your exam. Please contact our office in March 2023 to schedule a April 2023 appointment.   If you have any questions or concerns before your next appointment please send [place] a message through [place] or call our office at [phone].    TO LEAVE A MESSAGE FOR THE NURSE SELECT OPTION 2, PLEASE LEAVE A MESSAGE INCLUDING: YOUR NAME DATE OF BIRTH CALL BACK NUMBER REASON FOR CALL**this is important as we prioritize the call backs  YOU WILL RECEIVE A CALL BACK THE SAME DAY AS LONG AS YOU CALL BEFORE 4:00 PM   Do the following things EVERYDAY: Weigh yourself in the morning before breakfast. Write it down and keep it in a log. Take your medicines as prescribed Eat low salt foods--Limit salt (sodium) to 2000 mg per day.  Stay as active as you can everyday Limit all fluids for the day to less than 2 liters   At the [facility], you and your health needs are our priority. As part of our continuing mission to provide you with exceptional heart care, we have created designated Provider Care Teams. These Care Teams include your primary Cardiologist (physician) and Advanced Practice Providers (APPs- Physician Assistants and Nurse Practitioners) who all work together to provide you with the care you need, when you need it.   You may see any of the following providers on your designated Care Team at your next follow up: Dr Arvilla Meres Dr Carron Curie, NP Robbie Lis, [place] Karle Plumber, PharmD   Please be sure to bring in all your medications bottles to every appointment.

## 2021-09-08 NOTE — Progress Notes (Signed)
PCP: Andrey Campanile, MD Cardiology: Dr. Shirlee Latch  70 y.o. with history of HTN and smoking was recently diagnosed with atrial fibrillation and chronic systolic CHF.  Patient was admitted in 4/20 with left elbow septic bursitis.  Several weeks prior to the admission, he had been feeling palpitations and had been tiring more easily.  At the time of admission, he reported chest pressure.  He was found to be in atrial fibrillation with RVR.  Echo was done, showing EF depressed to 30-35%.  He was started on Coreg and it was titrated up to control his HR.  He was started on Eliquis.  He was not cardioverted.   In 10/20, he had LHC/RHC showing moderate nonobstructive CAD, elevated PCWP, and CI 1.85.  He was started on digoxin.  Three weeks later after restarting apixaban he had had successful DCCV to NSR later in 10/20.   He had recurrent atrial fibrillation and failed DCCV in 12/20.  He then had atrial fibrillation ablation in 1/21.  He is now off amiodarone.   Cardiac MRI (1/21) with LV EF 47%, RV EF 46%, basal inferolateral and apical lateral LGE, looks like coronary disease pattern.   Echo in 9/21 showed EF improved to 55-60%, normal RV.   He returns for followup of CHF and atrial fibrillation.  He is still smoking, not interested in quitting.  He remains in NSR today.  No palpitations.  Still working full time as a Music therapist. No exertional dyspnea or chest pain.  Weight down 10 lbs.   ECG (personally reviewed): Sinus brady at 47, iRBBB  Labs (5/20): LDL 65 Labs (8/20): K 4.5, creatinine 1.1, hgb 13.1 Labs (11/20): K 4, creatinine 1.08, hgb 15, digoxin 0.7 Labs (12/20): LDL 56 Labs (1/21): K 4.3, creatinine 1.2 Labs (2/21): K 4.4, creatinine 1.19 Labs (7/21): K 4.9, creatinine 1.69 Labs (10/21): K 4.6, creatinine 1.69 Labs (4/22): K 4.7, creatinine 1.47  PMH: 1. HTN 2. Atrial fibrillation: Paroxysmal. - DCCV to NSR in 10/20.  - Atrial fibrillation ablation 1/21.  3. Chronic systolic  CHF: Nonischemic cardiomyopathy.  echo (4/20) with EF 30-35%, mildly decreased RV systolic function.  - LHC/RHC (10/20): 60% mid LAD, 40% PLOM, 50% dRCA; mean RA 2, PA 45/20, mean PCWP 22, CI 1.85, PVR 2.3 WU.  - Cardiac MRI (1/21): LV EF 47%, RV EF 46%, basal inferolateral and apical lateral LGE, looks like coronary disease pattern.  - Echo (9/21): EF 55-60% with normal RV.  4. H/o septic bursitis 5. CAD: LHC in 10/20 with moderate nonobstructive disease (see above).  6. 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   FH: Father with lung cancer.  Brother with CABG in his 45s.   ROS: All systems reviewed and negative except as per HPI.   Current Outpatient Medications  Medication Sig Dispense Refill   apixaban (ELIQUIS) 5 MG TABS tablet Take 1 tablet (5 mg total) by mouth 2 (two) times daily. 60 tablet 5   carvedilol (COREG) 6.25 MG tablet TAKE 1 TABLET (6.25 MG TOTAL) BY MOUTH 2 (TWO) TIMES DAILY WITH A MEAL. 180 tablet 3   ENTRESTO 24-26 MG TAKE 1 TABLET BY MOUTH TWICE A DAY 60 tablet 11   rosuvastatin (CRESTOR) 10 MG tablet TAKE 1 TABLET BY MOUTH EVERY DAY 90 tablet 3   No current facility-administered medications for this encounter.   BP 138/78   Pulse (!) 53   Wt 109.7 kg (241 lb 12.8 oz)  SpO2 98%   BMI 29.43 kg/m  General: NAD Neck: No JVD, no thyromegaly or thyroid nodule.  Lungs: Distant BS. CV: Nondisplaced PMI.  Heart regular S1/S2, no S3/S4, no murmur.  No peripheral edema.  No carotid bruit.  Normal pedal pulses.  Abdomen: Soft, nontender, no hepatosplenomegaly, no distention.  Skin: Intact without lesions or rashes.  Neurologic: Alert and oriented x 3.  Psych: Normal affect. Extremities: No clubbing or cyanosis.  HEENT: Normal.   Assessment/Plan: 1. Chronic systolic CHF: Echo in 4/20 with EF 30-35%, mildly decreased RV systolic function.  Nonischemic cardiomyopathy, RHC/LHC in 10/20 showed nonobstructive CAD and low output  with CI 1.85.  It is possible that this is a tachycardia-mediated cardiomyopathy with EF down because of atrial fibrillation.  It is also possible that the cardiomyopathy is from some other cause and that the cardiomyopathy itself predisposed him to atrial fibrillation.  Cardiac MRI in 1/21 showed some improvement, with LV EF 47% and normal RV, there was coronary pattern LGE in the inferolateral wall.  Echo in 9/21 showed EF up to 55-60% with normal RV.  NYHA class II.  He is not volume overloaded on exam.   - Continue Coreg 6.25 mg bid, dose decreased in the past due to bradycardia.   - Continue Entresto 24/26 bid, dose decreased in the past due to hypotension.   - He is off spironolactone, with improved EF, I think he can stay off (but would continue Entresto and Coreg).   - BMET today.  - Repeat echo in 6 months at followup.  2. Atrial fibrillation: Paroxysmal.  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  He had DCCV to NSR in 10/20 but afib recurred.  He had atrial fibrillation ablation in 1/21.  He is in NSR today.  Amiodarone was stopped post-ablation.   - Continue Eliquis 5 mg bid.   3. Smoking: I strongly recommended that he quit.  He is not ready.  4. HTN: BP controlled.   5. CAD: Moderate nonobstructive CAD on 10/20 cath.  - No ASA given apixaban use.  - Continue Crestor 10 mg daily, check lipids today.   Followup in 6 months with echo.   Marca Ancona 09/08/2021

## 2021-09-11 ENCOUNTER — Telehealth (HOSPITAL_COMMUNITY): Payer: Self-pay | Admitting: Pharmacy Technician

## 2021-09-11 NOTE — Telephone Encounter (Signed)
Patient Advocate Encounter   Received notification from [employer] that prior authorization for Sean Barnett is required.   PA submitted on CoverMyMeds Key [id number] Status is pending   Will continue to follow.

## 2021-09-14 ENCOUNTER — Other Ambulatory Visit (HOSPITAL_COMMUNITY): Payer: Self-pay | Admitting: Cardiology

## 2021-09-14 DIAGNOSIS — I5020 Unspecified systolic (congestive) heart failure: Secondary | ICD-10-CM

## 2021-09-14 DIAGNOSIS — I4891 Unspecified atrial fibrillation: Secondary | ICD-10-CM

## 2021-09-15 NOTE — Telephone Encounter (Signed)
[facility] Patient Advocate Encounter  Prior Authorization for Entresto 24-26mg  has been approved.    PA# [id number] Effective dates: 09/11/21 through 09/11/22  Archer Asa, CPhT

## 2022-04-08 ENCOUNTER — Ambulatory Visit (HOSPITAL_BASED_OUTPATIENT_CLINIC_OR_DEPARTMENT_OTHER)
Admission: RE | Admit: 2022-04-08 | Discharge: 2022-04-08 | Disposition: A | Discharge status: Home / Self Care | Payer: Medicaid Other | Source: Ambulatory Visit | Attending: Cardiology | Admitting: Cardiology

## 2022-04-08 ENCOUNTER — Other Ambulatory Visit: Payer: Self-pay

## 2022-04-08 ENCOUNTER — Ambulatory Visit (HOSPITAL_COMMUNITY)
Admission: RE | Admit: 2022-04-08 | Discharge: 2022-04-08 | Disposition: A | Discharge status: Home / Self Care | Payer: Medicaid Other | Source: Ambulatory Visit | Attending: Internal Medicine | Admitting: Internal Medicine

## 2022-04-08 ENCOUNTER — Encounter (HOSPITAL_COMMUNITY): Payer: Self-pay | Admitting: Cardiology

## 2022-04-08 VITALS — BP 140/80 | HR 47 | Wt 238.2 lb

## 2022-04-08 DIAGNOSIS — F17200 Nicotine dependence, unspecified, uncomplicated: Secondary | ICD-10-CM | POA: Insufficient documentation

## 2022-04-08 DIAGNOSIS — I130 Hypertensive heart and chronic kidney disease with heart failure and stage 1 through stage 4 chronic kidney disease, or unspecified chronic kidney disease: Secondary | ICD-10-CM | POA: Diagnosis not present

## 2022-04-08 DIAGNOSIS — I480 Paroxysmal atrial fibrillation: Secondary | ICD-10-CM | POA: Diagnosis not present

## 2022-04-08 DIAGNOSIS — I110 Hypertensive heart disease with heart failure: Secondary | ICD-10-CM

## 2022-04-08 DIAGNOSIS — I2510 Atherosclerotic heart disease of native coronary artery without angina pectoris: Secondary | ICD-10-CM | POA: Diagnosis not present

## 2022-04-08 DIAGNOSIS — I428 Other cardiomyopathies: Secondary | ICD-10-CM | POA: Insufficient documentation

## 2022-04-08 DIAGNOSIS — I5022 Chronic systolic (congestive) heart failure: Secondary | ICD-10-CM | POA: Insufficient documentation

## 2022-04-08 DIAGNOSIS — Z7901 Long term (current) use of anticoagulants: Secondary | ICD-10-CM | POA: Insufficient documentation

## 2022-04-08 LAB — BASIC METABOLIC PANEL
Anion gap: 5 (ref 5–15)
BUN: 19 mg/dL (ref 8–23)
CO2: 25 mmol/L (ref 22–32)
Calcium: 8.7 mg/dL — ABNORMAL LOW (ref 8.9–10.3)
Chloride: 105 mmol/L (ref 98–111)
Creatinine, Ser: 1.4 mg/dL — ABNORMAL HIGH (ref 0.61–1.24)
GFR, Estimated: 54 mL/min — ABNORMAL LOW (ref >60)
Glucose, Bld: 103 mg/dL — ABNORMAL HIGH (ref 70–99)
Potassium: 4.6 mmol/L (ref 3.5–5.1)
Sodium: 135 mmol/L (ref 135–145)

## 2022-04-08 LAB — CBC
HCT: 35.5 % — ABNORMAL LOW (ref 39.0–52.0)
Hemoglobin: 11.8 g/dL — ABNORMAL LOW (ref 13.0–17.0)
MCH: 30.2 pg (ref 26.0–34.0)
MCHC: 33.2 g/dL (ref 30.0–36.0)
MCV: 90.8 fL (ref 80.0–100.0)
Platelets: 230 10*3/uL (ref 150–400)
RBC: 3.91 MIL/uL — ABNORMAL LOW (ref 4.22–5.81)
RDW: 14.6 % (ref 11.5–15.5)
WBC: 7.4 10*3/uL (ref 4.0–10.5)
nRBC: 0 % (ref 0.0–0.2)

## 2022-04-08 LAB — ECHOCARDIOGRAM COMPLETE
Area-P 1/2: 2.44 cm2
Calc EF: 50.4 %
S' Lateral: 4.2 cm
Single Plane A2C EF: 51.1 %
Single Plane A4C EF: 51.1 %

## 2022-04-08 MED ORDER — ENTRESTO 49-51 MG PO TABS: 1.0000 | ORAL_TABLET | Freq: Two times a day (BID) | ORAL | 3 refills | Status: DC

## 2022-04-08 NOTE — Patient Instructions (Signed)
EKG done today.  ? ?Labs done today. We will contact you only if your labs are abnormal. ? ?INCREASE Entresto 49-51mg  (1 tablet) by mouth 2 times daily.  ? ?No other medication changes were made. Please continue all current medications as prescribed. ? ?Your physician recommends that you schedule a follow-up appointment in: 10 days for a lab only appointment and in 6 months with our NP/PA Clinic here in our office. Please contact our office in October to schedule a November appointment.  ? ?If you have any questions or concerns before your next appointment please send [place] a message through [place] or call our office at [phone].   ? ?TO LEAVE A MESSAGE FOR THE NURSE SELECT OPTION 2, PLEASE LEAVE A MESSAGE INCLUDING: ?YOUR NAME ?DATE OF BIRTH ?CALL BACK NUMBER ?REASON FOR CALL**this is important as we prioritize the call backs ? ?YOU WILL RECEIVE A CALL BACK THE SAME DAY AS LONG AS YOU CALL BEFORE 4:00 PM ? ? ?Do the following things EVERYDAY: ?Weigh yourself in the morning before breakfast. Write it down and keep it in a log. ?Take your medicines as prescribed ?Eat low salt foods--Limit salt (sodium) to 2000 mg per day.  ?Stay as active as you can everyday ?Limit all fluids for the day to less than 2 liters ? ? ?At the [facility], you and your health needs are our priority. As part of our continuing mission to provide you with exceptional heart care, we have created designated Provider Care Teams. These Care Teams include your primary Cardiologist (physician) and Advanced Practice Providers (APPs- Physician Assistants and Nurse Practitioners) who all work together to provide you with the care you need, when you need it.  ? ?You may see any of the following providers on your designated Care Team at your next follow up: ?Dr Glori Bickers ?Dr [name] ?Darrick Grinder, NP ?Lyda Jester, PA ?Audry Riles, PharmD ? ? ?Please be sure to bring in all your medications bottles to every  appointment.  ? ?

## 2022-04-09 NOTE — Progress Notes (Signed)
PCP: Orvis Brill, MD ?Cardiology: Dr. Aundra Dubin ? ?71 y.o. with history of HTN and smoking was recently diagnosed with atrial fibrillation and chronic systolic CHF.  Patient was admitted in 4/20 with left elbow septic bursitis.  Several weeks prior to the admission, he had been feeling palpitations and had been tiring more easily.  At the time of admission, he reported chest pressure.  He was found to be in atrial fibrillation with RVR.  Echo was done, showing EF depressed to 30-35%.  He was started on Coreg and it was titrated up to control his HR.  He was started on Eliquis.  He was not cardioverted.  ? ?In 10/20, he had LHC/RHC showing moderate nonobstructive CAD, elevated PCWP, and CI 1.85.  He was started on digoxin.  Three weeks later after restarting apixaban he had had successful DCCV to NSR later in 10/20.  ? ?He had recurrent atrial fibrillation and failed DCCV in 12/20.  He then had atrial fibrillation ablation in 1/21.  He is now off amiodarone.  ? ?Cardiac MRI (1/21) with LV EF 47%, RV EF 46%, basal inferolateral and apical lateral LGE, looks like coronary disease pattern.  ? ?Echo in 9/21 showed EF improved to 55-60%, normal RV.  Echo was done today and reviewed, EF 55% with normal RV, mild biatrial enlargement.  ? ?He returns for followup of CHF and atrial fibrillation.  He is still smoking, not interested in quitting.  He remains in NSR today.  HR in 40s but he denies lightheadedness or syncope.  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  Stays active, no exertional dyspnea or chest pain.  No palpitations.  SBP runs in 140s generally when he checks. Weight down 3 lbs.  ? ?ECG (personally reviewed): Sinus brady, iRBBB ? ?Labs (5/20): LDL 65 ?Labs (8/20): K 4.5, creatinine 1.1, hgb 13.1 ?Labs (11/20): K 4, creatinine 1.08, hgb 15, digoxin 0.7 ?Labs (12/20): LDL 56 ?Labs (1/21): K 4.3, creatinine 1.2 ?Labs (2/21): K 4.4, creatinine 1.19 ?Labs (7/21): K 4.9, creatinine 1.69 ?Labs (10/21): K 4.6,  creatinine 1.69 ?Labs (4/22): K 4.7, creatinine 1.47 ?Labs (10/22): K 4.3, creatinine 1.49 ? ?PMH: ?1. HTN ?2. Atrial fibrillation: Paroxysmal. ?- DCCV to NSR in 10/20.  ?- Atrial fibrillation ablation 1/21.  ?3. Chronic systolic CHF: Nonischemic cardiomyopathy.  echo (4/20) with EF 30-35%, mildly decreased RV systolic function.  ?- LHC/RHC (10/20): 60% mid LAD, 40% PLOM, 50% dRCA; mean RA 2, PA 45/20, mean PCWP 22, CI 1.85, PVR 2.3 WU.  ?- Cardiac MRI (1/21): LV EF 47%, RV EF 46%, basal inferolateral and apical lateral LGE, looks like coronary disease pattern.  ?- Echo (9/21): EF 55-60% with normal RV.  ?- Echo (5/23): EF 55% with normal RV, mild biatrial enlargement.  ?4. H/o septic bursitis ?5. CAD: LHC in 10/20 with moderate nonobstructive disease (see above).  ?6. CKD stage 3 ? ?SH: Works as Games developer, widowed, smokes 1 ppd. Lives in [place].  ? ?FH: Father with lung cancer.  Brother with CABG in his 72s.  ? ?ROS: All systems reviewed and negative except as per HPI.  ? ?Current Outpatient Medications  ?Medication Sig Dispense Refill  ? apixaban (ELIQUIS) 5 MG TABS tablet Take 1 tablet (5 mg total) by mouth 2 (two) times daily. 60 tablet 5  ? carvedilol (COREG) 6.25 MG tablet TAKE 1 TABLET (6.25 MG TOTAL) BY MOUTH 2 (TWO) TIMES DAILY WITH A MEAL. 180 tablet 3  ? rosuvastatin (CRESTOR) 10 MG tablet TAKE 1  TABLET BY MOUTH EVERY DAY 90 tablet 3  ? sacubitril-valsartan (ENTRESTO) 49-51 MG Take 1 tablet by mouth 2 (two) times daily. 180 tablet 3  ? ?No current facility-administered medications for this encounter.  ? ?BP 140/80   Pulse (!) 47   Wt 108 kg (238 lb 3.2 oz)   SpO2 97%   BMI 28.99 kg/m?  ?General: NAD ?Neck: No JVD, no thyromegaly or thyroid nodule.  ?Lungs: Clear to auscultation bilaterally with normal respiratory effort. ?CV: Nondisplaced PMI.  Heart regular S1/S2, no S3/S4, no murmur.  No peripheral edema.  No carotid bruit.  Normal pedal pulses.  ?Abdomen: Soft, nontender, no hepatosplenomegaly,  no distention.  ?Skin: Intact without lesions or rashes.  ?Neurologic: Alert and oriented x 3.  ?Psych: Normal affect. ?Extremities: No clubbing or cyanosis.  ?HEENT: Normal.  ? ?Assessment/Plan: ?1. Chronic systolic CHF: Echo in [id number] with EF 30-35%, mildly decreased RV systolic function.  Nonischemic cardiomyopathy, RHC/LHC in 10/20 showed nonobstructive CAD and low output with CI 1.85.  It is possible that this is a tachycardia-mediated cardiomyopathy with EF down because of atrial fibrillation.  It is also possible that the cardiomyopathy is from some other cause and that the cardiomyopathy itself predisposed him to atrial fibrillation.  Cardiac MRI in 1/21 showed some improvement, with LV EF 47% and normal RV, there was coronary pattern LGE in the inferolateral wall.  Echo in 9/21 showed EF up to 55-60% with normal RV, echo today showed EF 55% with normal RV, mild biatrial enlargement. Marland Kitchen  NYHA class II.  He is not volume overloaded on exam.   ?- Continue Coreg 6.25 mg bid, dose decreased in the past due to bradycardia.   ?- With hypertension, increase Entresto to 49/51 bid.  BMET today and again in 10 days.  ?- He is off spironolactone, with improved EF, I think he can stay off (but would continue Entresto and Coreg).   ?2. Atrial fibrillation: Paroxysmal.  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  He had DCCV to NSR in 10/20 but afib recurred.  He had atrial fibrillation ablation in 1/21.  He is in NSR today.  Amiodarone was stopped post-ablation.   ?- Continue Eliquis 5 mg bid.  CBC today.  ?3. Smoking: I strongly recommended that he quit.  He is not ready.  ?4. HTN: BP mildly elevated, increase Entresto as above.   ?5. CAD: Moderate nonobstructive CAD on 10/20 cath.  ?- No ASA given apixaban use.  ?- Continue Crestor 10 mg daily, good lipids in 10/22.  ? ?Followup in 6 months with APP.  ? ?[name] ?04/09/2022 ? ? ? ? ?

## 2022-04-10 ENCOUNTER — Other Ambulatory Visit (HOSPITAL_COMMUNITY): Payer: Self-pay | Admitting: Cardiology

## 2022-04-20 ENCOUNTER — Ambulatory Visit (HOSPITAL_COMMUNITY)
Admission: RE | Admit: 2022-04-20 | Discharge: 2022-04-20 | Disposition: A | Discharge status: Home / Self Care | Payer: Medicaid Other | Source: Ambulatory Visit | Attending: Cardiology | Admitting: Cardiology

## 2022-04-20 DIAGNOSIS — I5022 Chronic systolic (congestive) heart failure: Secondary | ICD-10-CM

## 2022-04-20 LAB — BASIC METABOLIC PANEL
Anion gap: 4 — ABNORMAL LOW (ref 5–15)
BUN: 20 mg/dL (ref 8–23)
CO2: 25 mmol/L (ref 22–32)
Calcium: 8.8 mg/dL — ABNORMAL LOW (ref 8.9–10.3)
Chloride: 108 mmol/L (ref 98–111)
Creatinine, Ser: 1.34 mg/dL — ABNORMAL HIGH (ref 0.61–1.24)
GFR, Estimated: 57 mL/min — ABNORMAL LOW (ref >60)
Glucose, Bld: 85 mg/dL (ref 70–99)
Potassium: 4.3 mmol/L (ref 3.5–5.1)
Sodium: 137 mmol/L (ref 135–145)

## 2022-05-23 ENCOUNTER — Encounter: Payer: Self-pay | Admitting: *Deleted

## 2022-06-13 ENCOUNTER — Encounter (HOSPITAL_COMMUNITY): Payer: Self-pay

## 2022-06-13 ENCOUNTER — Ambulatory Visit (HOSPITAL_COMMUNITY)
Admission: EM | Admit: 2022-06-13 | Discharge: 2022-06-13 | Disposition: A | Discharge status: Home / Self Care | Payer: Medicaid Other | Attending: Internal Medicine | Admitting: Internal Medicine

## 2022-06-13 ENCOUNTER — Other Ambulatory Visit: Payer: Self-pay

## 2022-06-13 DIAGNOSIS — K0889 Other specified disorders of teeth and supporting structures: Secondary | ICD-10-CM | POA: Diagnosis not present

## 2022-06-13 DIAGNOSIS — K047 Periapical abscess without sinus: Secondary | ICD-10-CM | POA: Diagnosis not present

## 2022-06-13 MED ORDER — LIDOCAINE VISCOUS HCL 2 % MT SOLN: 15.0000 mL | OROMUCOSAL | 0 refills | Status: DC | PRN

## 2022-06-13 MED ORDER — AMOXICILLIN-POT CLAVULANATE 875-125 MG PO TABS: 1.0000 | ORAL_TABLET | Freq: Two times a day (BID) | ORAL | 0 refills | Status: DC

## 2022-06-13 NOTE — ED Triage Notes (Signed)
Pt states right sided dental pain for the last 2 days has been taking tylenol and motrin for the pain with relief. States last night he started with facial swelling on the right side of his face.

## 2022-06-13 NOTE — Discharge Instructions (Signed)
You have a dental infection which is being treated with antibiotic.  You have also been sent a lidocaine solution to help alleviate discomfort.  Please follow-up with dentist as soon as possible.

## 2022-06-13 NOTE — ED Provider Notes (Addendum)
[facility]    CSN: [id number] Arrival date & time: 06/13/22  1031      History   Chief Complaint Chief Complaint  Patient presents with   Dental Pain    HPI Sean Barnett is a 71 y.o. male.   Patient presents with right-sided lower dental pain that has been present for about 2 days.  He states that he noticed swelling last night.  He is attributing symptoms to eating chips and possibly causing some damage in the right lower gums.  Patient has taken Tylenol for pain with minimal improvement.  Denies fever, body aches, chills.   Dental Pain   Past Medical History:  Diagnosis Date   Bursitis of elbow 03/2019   left elbow   CHF (congestive heart failure) (HCC)    DDD (degenerative disc disease), lumbar    Dyspnea 03/30/2019   Insomnia 03/30/2019   Nonischemic cardiomyopathy (HCC)    Nonobstructive CAD    Persistent atrial fibrillation (HCC)    Septic bursitis of elbow, left 04/13/2019    Patient Active Problem List   Diagnosis Date Noted   Prediabetes 07/08/2021   Foot deformity, right 03/25/2020   Lower extremity numbness 03/25/2020   Persistent atrial fibrillation (HCC) 12/20/2019   Secondary hypercoagulable state (HCC) 12/20/2019   Healthcare maintenance 07/11/2019   Tobacco use 07/11/2019   Hypertension 04/13/2019   Systolic heart failure (HCC) 03/31/2019    Past Surgical History:  Procedure Laterality Date   ATRIAL FIBRILLATION ABLATION N/A 12/07/2019   Procedure: ATRIAL FIBRILLATION ABLATION;  Surgeon: Hillis Range, MD;  Location: [facility];  Service: Cardiovascular;  Laterality: N/A;   CARDIOVERSION N/A 09/27/2019   Procedure: CARDIOVERSION;  Surgeon: Laurey Morale, MD;  Location: [facility] ENDOSCOPY;  Service: Cardiovascular;  Laterality: N/A;   CARDIOVERSION N/A 11/30/2019   Procedure: CARDIOVERSION;  Surgeon: Laurey Morale, MD;  Location: [facility] ENDOSCOPY;  Service: Cardiovascular;  Laterality: N/A;   DENTAL EXAMINATION UNDER ANESTHESIA      RIGHT/LEFT HEART CATH AND CORONARY ANGIOGRAPHY N/A 09/06/2019   Procedure: RIGHT/LEFT HEART CATH AND CORONARY ANGIOGRAPHY;  Surgeon: Laurey Morale, MD;  Location: [facility] INVASIVE CV LAB;  Service: Cardiovascular;  Laterality: N/A;       Home Medications    Prior to Admission medications   Medication Sig Start Date End Date Taking? Authorizing Provider  amoxicillin-clavulanate (AUGMENTIN) 875-125 MG tablet Take 1 tablet by mouth every 12 (twelve) hours. 06/13/22  Yes Ricky Doan, Rolly Salter E, FNP  lidocaine (XYLOCAINE) 2 % solution Use as directed 15 mLs in the mouth or throat as needed for mouth pain. 06/13/22  Yes Madysun Thall, Acie Fredrickson, FNP  apixaban (ELIQUIS) 5 MG TABS tablet Take 1 tablet (5 mg total) by mouth 2 (two) times daily. 07/08/21 04/09/23  Doran Stabler, DO  carvedilol (COREG) 6.25 MG tablet TAKE 1 TABLET (6.25 MG TOTAL) BY MOUTH 2 (TWO) TIMES DAILY WITH A MEAL. 09/15/21   Laurey Morale, MD  rosuvastatin (CRESTOR) 10 MG tablet TAKE 1 TABLET BY MOUTH EVERY DAY 04/13/22   Laurey Morale, MD  sacubitril-valsartan (ENTRESTO) 49-51 MG Take 1 tablet by mouth 2 (two) times daily. 04/08/22   Laurey Morale, MD    Family History Family History  Problem Relation Age of Onset   Lung cancer Father     Social History Social History   Tobacco Use   Smoking status: Every Day    Packs/day: 1.00    Years: 50.00    Total pack years: 50.00  Types: Cigarettes   Smokeless tobacco: Never   Tobacco comments:    trying to "cut down"  Vaping Use   Vaping Use: Never used  Substance Use Topics   Alcohol use: Yes    Alcohol/week: 2.0 standard drinks of alcohol    Types: 1 Cans of beer, 1 Standard drinks or equivalent per week    Comment: rare   Drug use: Never     Allergies   Sulfa antibiotics   Review of Systems Review of Systems Per HPI  Physical Exam Triage Vital Signs ED Triage Vitals  Enc Vitals Group     BP 06/13/22 1108 (!) 149/79     Pulse Rate 06/13/22 1108 (!) 49     Resp  06/13/22 1108 16     Temp 06/13/22 1108 98.4 F (36.9 C)     Temp Source 06/13/22 1108 Oral     SpO2 06/13/22 1108 97 %     Weight --      Height --      Head Circumference --      Peak Flow --      Pain Score 06/13/22 1109 6     Pain Loc --      Pain Edu? --      Excl. in GC? --    No data found.  Updated Vital Signs BP (!) 149/79 (BP Location: Right Arm)   Pulse (!) 56   Temp 98.4 F (36.9 C) (Oral)   Resp 16   SpO2 97%   Visual Acuity Right Eye Distance:   Left Eye Distance:   Bilateral Distance:    Right Eye Near:   Left Eye Near:    Bilateral Near:     Physical Exam Constitutional:      General: He is not in acute distress.    Appearance: Normal appearance. He is not toxic-appearing or diaphoretic.  HENT:     Head: Normocephalic and atraumatic.     Mouth/Throat:     Lips: Pink.     Dentition: Abnormal dentition. Dental tenderness and gingival swelling present.     Comments: Gingival swelling and erythema to right lower dentition and surrounding gingiva.  No obvious abscess noted.  No obvious foreign bodies. Eyes:     Extraocular Movements: Extraocular movements intact.     Conjunctiva/sclera: Conjunctivae normal.  Pulmonary:     Effort: Pulmonary effort is normal.  Neurological:     General: No focal deficit present.     Mental Status: He is alert and oriented to person, place, and time. Mental status is at baseline.  Psychiatric:        Mood and Affect: Mood normal.        Behavior: Behavior normal.        Thought Content: Thought content normal.        Judgment: Judgment normal.      UC Treatments / Results  Labs (all labs ordered are listed, but only abnormal results are displayed) Labs Reviewed - No data to display  EKG   Radiology No results found.  Procedures Procedures (including critical care time)  Medications Ordered in UC Medications - No data to display  Initial Impression / Assessment and Plan / UC Course  I have reviewed  the triage vital signs and the nursing notes.  Pertinent labs & imaging results that were available during my care of the patient were reviewed by me and considered in my medical decision making (see chart for details).  Patient has dental infection and swelling.  Will treat with Augmentin.  Viscous lidocaine prescribed for patient to alleviate discomfort.  Discussed supportive care.  Patient advised to follow-up with dentist for further evaluation and management as soon as possible.  Patient verbalized understanding and was agreeable with plan.  Patient's heart rate in 50s but patient states this is baseline and after further review this is consistent with patient's baseline heart rate. Patient is stable and no current complications noted on physical exam.  Final Clinical Impressions(s) / UC Diagnoses   Final diagnoses:  Pain, dental  Dental infection     Discharge Instructions      You have a dental infection which is being treated with antibiotic.  You have also been sent a lidocaine solution to help alleviate discomfort.  Please follow-up with dentist as soon as possible.    ED Prescriptions     Medication Sig Dispense Auth. Provider   amoxicillin-clavulanate (AUGMENTIN) 875-125 MG tablet Take 1 tablet by mouth every 12 (twelve) hours. 14 tablet [place], [place] [address], [place]   lidocaine (XYLOCAINE) 2 % solution Use as directed 15 mLs in the mouth or throat as needed for mouth pain. 100 mL [place]avus Bryant, [place]      PDMP not reviewed this encounter.   G[place]avus [place], [place] 06/13/22 1138    [address] [place] 06/13/22 1139    Gustavus Bryant, [place] 06/13/22 1139

## 2022-06-23 ENCOUNTER — Other Ambulatory Visit: Payer: Self-pay | Admitting: Student

## 2022-06-23 DIAGNOSIS — I4819 Other persistent atrial fibrillation: Secondary | ICD-10-CM

## 2022-06-23 DIAGNOSIS — I482 Chronic atrial fibrillation, unspecified: Secondary | ICD-10-CM

## 2022-06-23 NOTE — Telephone Encounter (Signed)
Thank Glenda for getting the follow-up scheduled for him!

## 2022-06-23 NOTE — Telephone Encounter (Signed)
LOV 07/08/2021 with 6 month f/u. Called pt to schedule an appt - he's agreeable. Call transferred to front office- appt schedule w/PCP 8/28.

## 2022-07-02 ENCOUNTER — Ambulatory Visit (HOSPITAL_BASED_OUTPATIENT_CLINIC_OR_DEPARTMENT_OTHER): Payer: Medicaid Other | Admitting: Internal Medicine

## 2022-07-02 ENCOUNTER — Encounter (HOSPITAL_BASED_OUTPATIENT_CLINIC_OR_DEPARTMENT_OTHER): Payer: Self-pay | Admitting: Internal Medicine

## 2022-07-02 VITALS — BP 122/68 | HR 56 | Ht 76.0 in | Wt 227.0 lb

## 2022-07-02 DIAGNOSIS — I4819 Other persistent atrial fibrillation: Secondary | ICD-10-CM | POA: Diagnosis not present

## 2022-07-02 DIAGNOSIS — I5020 Unspecified systolic (congestive) heart failure: Secondary | ICD-10-CM

## 2022-07-02 DIAGNOSIS — D6869 Other thrombophilia: Secondary | ICD-10-CM | POA: Diagnosis not present

## 2022-07-02 NOTE — Patient Instructions (Addendum)
Medication Instructions:  Your physician recommends that you continue on your current medications as directed. Please refer to the Current Medication list given to you today. *If you need a refill on your cardiac medications before your next appointment, please call your pharmacy*  Lab Work: None ordered. If you have labs (blood work) drawn today and your tests are completely normal, you will receive your results only by: MyChart Message (if you have MyChart) OR A paper copy in the mail If you have any lab test that is abnormal or we need to change your treatment, we will call you to review the results.  Testing/Procedures: None ordered.  Follow-Up: At [facility], you and your health needs are our priority.  As part of our continuing mission to provide you with exceptional heart care, we have created designated Provider Care Teams.  These Care Teams include your primary Cardiologist (physician) and Advanced Practice Providers (APPs -  Physician Assistants and Nurse Practitioners) who all work together to provide you with the care you need, when you need it.  Your next appointment:    Your physician wants you to follow-up in: one year follow up in the [facility].   (  We want to encourage you to STOP SMOKING, to help improve your cardiac health. )   You will receive a reminder letter in the mail two months in advance. If you don't receive a letter, please call our office to schedule the follow-up appointment.   Important Information About Sugar

## 2022-07-02 NOTE — Progress Notes (Signed)
PCP: Marrianne Mood, MD Primary Cardiologist: Dr Shirlee Latch Primary EP: Dr Johney Frame  Sean Barnett is a 71 y.o. male who presents today for routine electrophysiology followup.  Since last being seen in our clinic, the patient reports doing very well.  Today, he denies symptoms of palpitations, chest pain, shortness of breath,  lower extremity edema, dizziness, presyncope, or syncope.  The patient is otherwise without complaint today.   Past Medical History:  Diagnosis Date   Bursitis of elbow 03/2019   left elbow   CHF (congestive heart failure) (HCC)    DDD (degenerative disc disease), lumbar    Dyspnea 03/30/2019   Insomnia 03/30/2019   Nonischemic cardiomyopathy (HCC)    Nonobstructive CAD    Persistent atrial fibrillation (HCC)    Septic bursitis of elbow, left 04/13/2019   Past Surgical History:  Procedure Laterality Date   ATRIAL FIBRILLATION ABLATION N/A 12/07/2019   Procedure: ATRIAL FIBRILLATION ABLATION;  Surgeon: Hillis Range, MD;  Location: [facility];  Service: Cardiovascular;  Laterality: N/A;   CARDIOVERSION N/A 09/27/2019   Procedure: CARDIOVERSION;  Surgeon: Laurey Morale, MD;  Location: [facility] ENDOSCOPY;  Service: Cardiovascular;  Laterality: N/A;   CARDIOVERSION N/A 11/30/2019   Procedure: CARDIOVERSION;  Surgeon: Laurey Morale, MD;  Location: [facility] ENDOSCOPY;  Service: Cardiovascular;  Laterality: N/A;   DENTAL EXAMINATION UNDER ANESTHESIA     RIGHT/LEFT HEART CATH AND CORONARY ANGIOGRAPHY N/A 09/06/2019   Procedure: RIGHT/LEFT HEART CATH AND CORONARY ANGIOGRAPHY;  Surgeon: Laurey Morale, MD;  Location: [facility] INVASIVE CV LAB;  Service: Cardiovascular;  Laterality: N/A;    ROS- all systems are reviewed and negatives except as per HPI above  Current Outpatient Medications  Medication Sig Dispense Refill   carvedilol (COREG) 6.25 MG tablet TAKE 1 TABLET (6.25 MG TOTAL) BY MOUTH 2 (TWO) TIMES DAILY WITH A MEAL. 180 tablet 3   ELIQUIS 5 MG TABS tablet TAKE 1  TABLET BY MOUTH TWICE A DAY 180 tablet 3   rosuvastatin (CRESTOR) 10 MG tablet TAKE 1 TABLET BY MOUTH EVERY DAY 90 tablet 2   sacubitril-valsartan (ENTRESTO) 49-51 MG Take 1 tablet by mouth 2 (two) times daily. 180 tablet 3   No current facility-administered medications for this visit.    Physical Exam: Vitals:   07/02/22 1149  BP: 122/68  Pulse: (!) 56  SpO2: 97%  Weight: 227 lb (103 kg)  Height: 6\' 4"  (1.93 m)    GEN- The patient is well appearing, alert and oriented x 3 today.   Head- normocephalic, atraumatic Eyes-  Sclera clear, conjunctiva pink Ears- hearing intact Oropharynx- clear Lungs- Clear to ausculation bilaterally, normal work of breathing Heart- Regular rate and rhythm, no murmurs, rubs or gallops, PMI not laterally displaced GI- soft, NT, ND, + BS Extremities- no clubbing, cyanosis, or edema  Wt Readings from Last 3 Encounters:  07/02/22 227 lb (103 kg)  04/08/22 238 lb 3.2 oz (108 kg)  09/08/21 241 lb 12.8 oz (109.7 kg)      Assessment and Plan:  Persistent afib Maintaining sinus rhythm post ablation off AAD therapy He is on eliquis for chads2vasc score of 2 Cnc, bmet 5/23 reviewed  2. 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   Return in a year to AF clinic  06/08/22 MD, [facility]  07/02/2022 12:01 PM

## 2022-07-27 ENCOUNTER — Ambulatory Visit: Payer: Medicaid Other | Admitting: Student

## 2022-07-27 ENCOUNTER — Other Ambulatory Visit: Payer: Self-pay

## 2022-07-27 ENCOUNTER — Encounter: Payer: Self-pay | Admitting: Student

## 2022-07-27 VITALS — BP 132/70 | HR 54 | Temp 97.5°F | Resp 32 | Ht 76.0 in | Wt 238.2 lb

## 2022-07-27 DIAGNOSIS — M25561 Pain in right knee: Secondary | ICD-10-CM | POA: Diagnosis not present

## 2022-07-27 DIAGNOSIS — F17210 Nicotine dependence, cigarettes, uncomplicated: Secondary | ICD-10-CM

## 2022-07-27 DIAGNOSIS — M25562 Pain in left knee: Secondary | ICD-10-CM | POA: Diagnosis not present

## 2022-07-27 DIAGNOSIS — Z0000 Encounter for general adult medical examination without abnormal findings: Secondary | ICD-10-CM

## 2022-07-27 DIAGNOSIS — I2510 Atherosclerotic heart disease of native coronary artery without angina pectoris: Secondary | ICD-10-CM | POA: Diagnosis not present

## 2022-07-27 DIAGNOSIS — I428 Other cardiomyopathies: Secondary | ICD-10-CM | POA: Diagnosis not present

## 2022-07-27 DIAGNOSIS — D649 Anemia, unspecified: Secondary | ICD-10-CM | POA: Diagnosis not present

## 2022-07-27 DIAGNOSIS — Z720 Tobacco use: Secondary | ICD-10-CM

## 2022-07-27 DIAGNOSIS — I4819 Other persistent atrial fibrillation: Secondary | ICD-10-CM | POA: Diagnosis present

## 2022-07-27 DIAGNOSIS — R7303 Prediabetes: Secondary | ICD-10-CM | POA: Diagnosis not present

## 2022-07-27 DIAGNOSIS — I10 Essential (primary) hypertension: Secondary | ICD-10-CM | POA: Diagnosis not present

## 2022-07-27 DIAGNOSIS — M25569 Pain in unspecified knee: Secondary | ICD-10-CM | POA: Insufficient documentation

## 2022-07-27 LAB — POCT GLYCOSYLATED HEMOGLOBIN (HGB A1C): Hemoglobin A1C: 5.7 % — AB (ref 4.0–5.6)

## 2022-07-27 LAB — GLUCOSE, CAPILLARY: Glucose-Capillary: 97 mg/dL (ref 70–99)

## 2022-07-27 NOTE — Assessment & Plan Note (Signed)
On chart review the patient was noted to have a mild normocytic anemia present since September 2021.  Also had a brief period of anemia in 2020.  Most recent hemoglobin in May 2023 was 11.8.  Baseline seems to be around 13 or 14.  Patient is asymptomatic.  Denies signs of overt bleeding.  We will repeat CBC today and also check iron, TIBC and ferritin.

## 2022-07-27 NOTE — Assessment & Plan Note (Addendum)
Repeat hemoglobin A1c is 5.7, stable from 5.8 one year ago.

## 2022-07-27 NOTE — Assessment & Plan Note (Signed)
No symptoms suggestive of paroxysms of RVR.  No signs or symptoms of embolic phenomenon.  Exam notable for bradycardia today, but patient is asymptomatic.  Tolerating his Eliquis without significant bleeding side effects.  Problem is well controlled and stable. - Continue Coreg 6.25 mg twice daily - Continue Eliquis 5 mg twice daily

## 2022-07-27 NOTE — Assessment & Plan Note (Addendum)
Intermittent sharp pain in knees, seems to be with specific types of movements, especially while at work.  Has a brace at home that he uses to good effect.  Does not require any over-the-counter medicines.  Knows not to take NSAIDs with his heart conditions.  Possibly due to osteoarthritis, but since this does not seem to be an activity or quality of life limiting problem I will defer further workup including imaging for now.

## 2022-07-27 NOTE — Patient Instructions (Addendum)
Thank you, [name] for allowing [place] to provide your care today. Today we discussed anemia, atrial fibrillation, heart failure, high blood pressure, tobacco use, screening colonoscopy, knee pain, and tooth problems. I will start evaluating your mild anemia by drawing blood today to check your hemoglobin and iron levels. If it demonstrates continued mild anemia, I recommend evaluation for a gastrointestinal source of bleeding, as this is the most common cause of anemia in men your age. Your chronic problems are under good control.  I have ordered the following labs for you:   Lab Orders         Lipid Profile         Iron and IBC ([id number])         CBC no Diff         Ferritin         POC Hbg A1C      Referrals ordered today:   Referral Orders  No referral(s) requested today     I have ordered the following medication/changed the following medications:   Stop the following medications: There are no discontinued medications.   Start the following medications: No orders of the defined types were placed in this encounter.    Follow up: 1 year   We look forward to seeing you next time. Please call our clinic at [phone] if you have any questions or concerns. The best time to call is Monday-Friday from 9am-4pm, but there is someone available 24/7. If after hours or the weekend, call the main hospital number and ask for the Internal Medicine Resident On-Call. If you need medication refills, please notify your pharmacy one week in advance and they will send [place] a request.   Thank you for trusting me with your care. Wishing you the best!   Marrianne Mood, MD [facility]

## 2022-07-27 NOTE — Assessment & Plan Note (Signed)
Patient continues to remain asymptomatic.  Denies episodic chest pain, shortness of breath, orthopnea, PND, leg swelling.  Stressed the importance of adhering to his regimen of Entresto and carvedilol.  Has follow-up with heart failure physician, Dr. Shirlee Latch, in a few months. - Continue carvedilol 6.25 mg twice daily - Continue Entresto 100 twice daily

## 2022-07-27 NOTE — Assessment & Plan Note (Signed)
Discussed the utility of colon cancer screening as the patient approaches his mid 58s.  Increased importance in the setting of his mild anemia.  He reports not being concerned about his risk due to negative family history and absence of symptoms.  He prefers to forego colonoscopy at this time.

## 2022-07-27 NOTE — Assessment & Plan Note (Signed)
Discussed tobacco use with patient today.  He is currently making attempts to cut back.  Still admits that he enjoys smoking very much.  Reaffirmed that quitting smoking is the best thing the patient can do for his health.

## 2022-07-27 NOTE — Progress Notes (Signed)
Subjective:  CC: Teeth problems  HPI:  Sean Barnett is a 71 y.o. male with a past medical history stated below and presents today for routine follow up. Please see problem based assessment and plan for additional details.  Patient's primary concern today is tooth problems.  He reports loosening of several teeth.  He has not been to a dentist in decades.  Denies pain.  He also complains of intermittent bilateral knee pain.  Worse with certain movements.  Using a knee brace improves the pain.  He has not required the use of any over-the-counter medicines for his knee pain.  He is aware of the risks of ibuprofen with his heart condition and the fact that he is on Eliquis.  Has not had any episodes of chest pain, shortness of breath, heart palpitations recently.  His capacity for work is good.  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  He does not feel like his health conditions have been slowing him down.  Past Medical History:  Diagnosis Date   Bursitis of elbow 03/2019   left elbow   CHF (congestive heart failure) (HCC)    DDD (degenerative disc disease), lumbar    Dyspnea 03/30/2019   Insomnia 03/30/2019   Nonischemic cardiomyopathy (HCC)    Nonobstructive CAD    Persistent atrial fibrillation (HCC)    Septic bursitis of elbow, left 04/13/2019    Current Outpatient Medications on File Prior to Visit  Medication Sig Dispense Refill   carvedilol (COREG) 6.25 MG tablet TAKE 1 TABLET (6.25 MG TOTAL) BY MOUTH 2 (TWO) TIMES DAILY WITH A MEAL. 180 tablet 3   ELIQUIS 5 MG TABS tablet TAKE 1 TABLET BY MOUTH TWICE A DAY 180 tablet 3   rosuvastatin (CRESTOR) 10 MG tablet TAKE 1 TABLET BY MOUTH EVERY DAY 90 tablet 2   sacubitril-valsartan (ENTRESTO) 49-51 MG Take 1 tablet by mouth 2 (two) times daily. 180 tablet 3   No current facility-administered medications on file prior to visit.    Family History  Problem Relation Age of Onset   Lung cancer  Father     Social History   Socioeconomic History   Marital status: Widowed    Spouse name: Not on file   Number of children: Not on file   Years of education: Not on file   Highest education level: Not on file  Occupational History   Not on file  Tobacco Use   Smoking status: Every Day    Packs/day: 1.00    Years: 50.00    Total pack years: 50.00    Types: Cigarettes   Smokeless tobacco: Never   Tobacco comments:    trying to "cut down"  Vaping Use   Vaping Use: Never used  Substance and Sexual Activity   Alcohol use: Yes    Alcohol/week: 2.0 standard drinks of alcohol    Types: 1 Cans of beer, 1 Standard drinks or equivalent per week    Comment: rare   Drug use: Never   Sexual activity: Not on file  Other Topics Concern   Not on file  Social History Narrative   Lives in [place] alone   [place] (self employed)   Social Determinants of Health   Financial Resource Strain: Not on file  Food Insecurity: Not on file  Transportation Needs: Not on file  Physical Activity: Not on file  Stress: Not on file  Social Connections: Not on file  Intimate Partner Violence: Not on  file    Review of Systems: ROS negative except for what is noted on the assessment and plan.  Objective:   Vitals:   07/27/22 1011  BP: 132/70  Pulse: (!) 54  Resp: (!) 32  Temp: (!) 97.5 F (36.4 C)  TempSrc: Oral  SpO2: 99%  Weight: 238 lb 3.2 oz (108 kg)  Height: 6\' 4"  (1.93 m)    Physical Exam: Constitutional: well-appearing male sitting in chair, in no acute distress HENT: Poor dentition Cardiovascular: Bradycardia.  Right radial pulse 2+.  No JVD. Pulmonary/Chest: normal work of breathing on room air, decreased lung sounds bilaterally Abdominal: soft, non-tender, non-distended MSK: normal bulk and tone.  Normal range of motion bilateral knees.  No appreciable effusion in bilateral knees.  No joint line tenderness bilateral knees.  No lower extremity swelling. Neurological:  alert & oriented Skin: warm and dry Psych: Appropriate mood and affect   Assessment & Plan:  Persistent atrial fibrillation (HCC) No symptoms suggestive of paroxysms of RVR.  No signs or symptoms of embolic phenomenon.  Exam notable for bradycardia today, but patient is asymptomatic.  Tolerating his Eliquis without significant bleeding side effects.  Problem is well controlled and stable. - Continue Coreg 6.25 mg twice daily - Continue Eliquis 5 mg twice daily  Nonischemic cardiomyopathy [facility]) Patient continues to remain asymptomatic.  Denies episodic chest pain, shortness of breath, orthopnea, PND, leg swelling.  Stressed the importance of adhering to his regimen of Entresto and carvedilol.  Has follow-up with heart failure physician, [facility]. [facility], in a few months. - Continue carvedilol 6.25 mg twice daily - Continue Entresto 100 twice daily  Hypertension In this robust patient with active lifestyle I will aim for a goal SBP less than 130 mmHg.  Patient is currently at this goal on his current regimen.  Occasionally has episodes of what sound like orthostatic hypotension, but acknowledges that these episodes typically occur on days where he does not eat/drink very much and is doing strenuous physical labor.  On exam today BP is 132/70. No adjustments necessary today. - Continue carvedilol and Entresto  Prediabetes Repeat hemoglobin A1c is 5.7, stable from 5.8 one year ago.  Normocytic anemia On chart review the patient was noted to have a mild normocytic anemia present since September 2021.  Also had a brief period of anemia in 2020.  Most recent hemoglobin in May 2023 was 11.8.  Baseline seems to be around 13 or 14.  Patient is asymptomatic.  Denies signs of overt bleeding.  We will repeat CBC today and also check iron, TIBC and ferritin.    Knee pain Intermittent sharp pain in knees, seems to be with specific types of movements, especially while at work.  Has a brace at home that he uses  to good effect.  Does not require any over-the-counter medicines.  Knows not to take NSAIDs with his heart conditions.  Possibly due to osteoarthritis, but since this does not seem to be an activity or quality of life limiting problem I will defer further workup including imaging for now.  Tobacco use Discussed tobacco use with patient today.  He is currently making attempts to cut back.  Still admits that he enjoys smoking very much.  Reaffirmed that quitting smoking is the best thing the patient can do for his health.  Healthcare maintenance Discussed the utility of colon cancer screening as the patient approaches his mid 43s.  Increased importance in the setting of his mild anemia.  He reports not being  concerned about his risk due to negative family history and absence of symptoms.  He prefers to forego colonoscopy at this time.    Patient seen with Dr. Claris Gladden, M.D. [facility]  PGY-1 Pager: [phone] Date 07/27/2022  Time 4:26 PM

## 2022-07-27 NOTE — Assessment & Plan Note (Addendum)
In this robust patient with active lifestyle I will aim for a goal SBP less than 130 mmHg.  Patient is currently at this goal on his current regimen.  Occasionally has episodes of what sound like orthostatic hypotension, but acknowledges that these episodes typically occur on days where he does not eat/drink very much and is doing strenuous physical labor.  On exam today BP is 132/70. No adjustments necessary today. - Continue carvedilol and Entresto

## 2022-07-28 LAB — CBC
Hematocrit: 36.1 % — ABNORMAL LOW (ref 37.5–51.0)
Hemoglobin: 12 g/dL — ABNORMAL LOW (ref 13.0–17.7)
MCH: 30.8 pg (ref 26.6–33.0)
MCHC: 33.2 g/dL (ref 31.5–35.7)
MCV: 93 fL (ref 79–97)
Platelets: 231 10*3/uL (ref 150–450)
RBC: 3.9 x10E6/uL — ABNORMAL LOW (ref 4.14–5.80)
RDW: 14.5 % (ref 11.6–15.4)
WBC: 7.1 10*3/uL (ref 3.4–10.8)

## 2022-07-28 LAB — LIPID PANEL
Chol/HDL Ratio: 2.9 ratio (ref 0.0–5.0)
Cholesterol, Total: 94 mg/dL — ABNORMAL LOW (ref 100–199)
HDL: 32 mg/dL — ABNORMAL LOW (ref >39)
LDL Chol Calc (NIH): 46 mg/dL (ref 0–99)
Triglycerides: 75 mg/dL (ref 0–149)
VLDL Cholesterol Cal: 16 mg/dL (ref 5–40)

## 2022-07-28 LAB — IRON AND TIBC
Iron Saturation: 19 % (ref 15–55)
Iron: 42 ug/dL (ref 38–169)
Total Iron Binding Capacity: 225 ug/dL — ABNORMAL LOW (ref 250–450)
UIBC: 183 ug/dL (ref 111–343)

## 2022-07-28 LAB — FERRITIN: Ferritin: 330 ng/mL (ref 30–400)

## 2022-07-28 NOTE — Progress Notes (Signed)
Internal Medicine Clinic Attending  I saw and evaluated the patient.  I personally confirmed the key portions of the history and exam documented by Dr. McLendon and I reviewed pertinent patient test results.  The assessment, diagnosis, and plan were formulated together and I agree with the documentation in the resident's note.  

## 2022-08-14 ENCOUNTER — Other Ambulatory Visit (HOSPITAL_COMMUNITY): Payer: Self-pay

## 2022-08-14 ENCOUNTER — Telehealth (HOSPITAL_COMMUNITY): Payer: Self-pay

## 2022-08-14 NOTE — Telephone Encounter (Signed)
Advanced Heart Failure Patient Advocate Encounter   Received notification from [employer] Program that prior authorization is required for [facility]. PA submitted and APPROVED on 08/14/2022.  [facility]  Effective: 08/14/2022 -  08/14/2023  Burnell Blanks, CPhT Rx Patient Advocate Phone: [phone]

## 2022-09-22 ENCOUNTER — Other Ambulatory Visit (HOSPITAL_COMMUNITY): Payer: Self-pay | Admitting: Cardiology

## 2022-09-22 DIAGNOSIS — I4891 Unspecified atrial fibrillation: Secondary | ICD-10-CM

## 2022-09-22 DIAGNOSIS — I5020 Unspecified systolic (congestive) heart failure: Secondary | ICD-10-CM

## 2022-10-06 NOTE — Progress Notes (Signed)
PCP: Marrianne Mood, MD Cardiology: Dr. Shirlee Latch  71 y.o. with history of HTN and smoking was recently diagnosed with atrial fibrillation and chronic systolic CHF.  Patient was admitted in 4/20 with left elbow septic bursitis.  Several weeks prior to the admission, he had been feeling palpitations and had been tiring more easily.  At the time of admission, he reported chest pressure.  He was found to be in atrial fibrillation with RVR.  Echo was done, showing EF depressed to 30-35%.  He was started on Coreg and it was titrated up to control his HR.  He was started on Eliquis.  He was not cardioverted.   In 10/20, he had LHC/RHC showing moderate nonobstructive CAD, elevated PCWP, and CI 1.85.  He was started on digoxin.  Three weeks later after restarting apixaban he had had successful DCCV to NSR later in 10/20.   He had recurrent atrial fibrillation and failed DCCV in 12/20.  He then had atrial fibrillation ablation in 1/21.  He is now off amiodarone.   Cardiac MRI (1/21) with LV EF 47%, RV EF 46%, basal inferolateral and apical lateral LGE, looks like coronary disease pattern.   Echo in 9/21 showed EF improved to 55-60%, normal RV.  Echo was done today and reviewed, EF 55% with normal RV, mild biatrial enlargement.   He returns for followup of CHF and atrial fibrillation.  He is still smoking, not interested in quitting.  He remains in NSR today.  HR in 40s but he denies lightheadedness or syncope.  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  Stays active, no exertional dyspnea or chest pain.  No palpitations.  SBP runs in 140s generally when he checks. Weight down 3 lbs.   ECG (personally reviewed): Sinus brady, iRBBB  Labs (5/20): LDL 65 Labs (8/20): K 4.5, creatinine 1.1, hgb 13.1 Labs (11/20): K 4, creatinine 1.08, hgb 15, digoxin 0.7 Labs (12/20): LDL 56 Labs (1/21): K 4.3, creatinine 1.2 Labs (2/21): K 4.4, creatinine 1.19 Labs (7/21): K 4.9, creatinine 1.69 Labs (10/21): K 4.6,  creatinine 1.69 Labs (4/22): K 4.7, creatinine 1.47 Labs (10/22): K 4.3, creatinine 1.49  PMH: 1. HTN 2. Atrial fibrillation: Paroxysmal. - DCCV to NSR in 10/20.  - Atrial fibrillation ablation 1/21.  3. Chronic systolic CHF: Nonischemic cardiomyopathy.  echo (4/20) with EF 30-35%, mildly decreased RV systolic function.  - LHC/RHC (10/20): 60% mid LAD, 40% PLOM, 50% dRCA; mean RA 2, PA 45/20, mean PCWP 22, CI 1.85, PVR 2.3 WU.  - Cardiac MRI (1/21): LV EF 47%, RV EF 46%, basal inferolateral and apical lateral LGE, looks like coronary disease pattern.  - Echo (9/21): EF 55-60% with normal RV.  - Echo (5/23): EF 55% with normal RV, mild biatrial enlargement.  4. H/o septic bursitis 5. CAD: LHC in 10/20 with moderate nonobstructive disease (see above).  6. CKD stage 3  SH: Works as Music therapist, widowed, smokes 1 ppd. Lives in [place].   FH: Father with lung cancer.  Brother with CABG in his 41s.   ROS: All systems reviewed and negative except as per HPI.   Current Outpatient Medications  Medication Sig Dispense Refill   carvedilol (COREG) 6.25 MG tablet TAKE 1 TABLET BY MOUTH 2 TIMES DAILY WITH A MEAL. 180 tablet 3   ELIQUIS 5 MG TABS tablet TAKE 1 TABLET BY MOUTH TWICE A DAY 180 tablet 3   rosuvastatin (CRESTOR) 10 MG tablet TAKE 1 TABLET BY MOUTH EVERY DAY 90 tablet 2  sacubitril-valsartan (ENTRESTO) 49-51 MG Take 1 tablet by mouth 2 (two) times daily. 180 tablet 3   No current facility-administered medications for this visit.   There were no vitals taken for this visit. General: NAD Neck: No JVD, no thyromegaly or thyroid nodule.  Lungs: Clear to auscultation bilaterally with normal respiratory effort. CV: Nondisplaced PMI.  Heart regular S1/S2, no S3/S4, no murmur.  No peripheral edema.  No carotid bruit.  Normal pedal pulses.  Abdomen: Soft, nontender, no hepatosplenomegaly, no distention.  Skin: Intact without lesions or rashes.  Neurologic: Alert and oriented x 3.   Psych: Normal affect. Extremities: No clubbing or cyanosis.  HEENT: Normal.   Assessment/Plan: 1. Chronic systolic CHF: Echo in 1/61 with EF 30-35%, mildly decreased RV systolic function.  Nonischemic cardiomyopathy, RHC/LHC in 10/20 showed nonobstructive CAD and low output with CI 1.85.  It is possible that this is a tachycardia-mediated cardiomyopathy with EF down because of atrial fibrillation.  It is also possible that the cardiomyopathy is from some other cause and that the cardiomyopathy itself predisposed him to atrial fibrillation.  Cardiac MRI in 1/21 showed some improvement, with LV EF 47% and normal RV, there was coronary pattern LGE in the inferolateral wall.  Echo in 9/21 showed EF up to 55-60% with normal RV, echo today showed EF 55% with normal RV, mild biatrial enlargement. Marland Kitchen  NYHA class II.  He is not volume overloaded on exam.   - Continue Coreg 6.25 mg bid, dose decreased in the past due to bradycardia.   - With hypertension, increase Entresto to 49/51 bid.  BMET today and again in 10 days.  - He is off spironolactone, with improved EF, I think he can stay off (but would continue Entresto and Coreg).   2. Atrial fibrillation: Paroxysmal.  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  He had DCCV to NSR in 10/20 but afib recurred.  He had atrial fibrillation ablation in 1/21.  He is in NSR today.  Amiodarone was stopped post-ablation.   - Continue Eliquis 5 mg bid.  CBC today.  3. Smoking: I strongly recommended that he quit.  He is not ready.  4. HTN: BP mildly elevated, increase Entresto as above.   5. CAD: Moderate nonobstructive CAD on 10/20 cath.  - No ASA given apixaban use.  - Continue Crestor 10 mg daily, good lipids in 10/22.   Followup in 6 months with APP.   [place] 10/06/2022

## 2022-10-07 ENCOUNTER — Ambulatory Visit (HOSPITAL_COMMUNITY)
Admission: RE | Admit: 2022-10-07 | Discharge: 2022-10-07 | Disposition: A | Discharge status: Home / Self Care | Payer: Medicare Other | Source: Ambulatory Visit | Attending: Physician Assistant | Admitting: Physician Assistant

## 2022-10-07 ENCOUNTER — Encounter (HOSPITAL_COMMUNITY): Payer: Self-pay

## 2022-10-07 ENCOUNTER — Other Ambulatory Visit (HOSPITAL_COMMUNITY): Payer: Self-pay | Admitting: Cardiology

## 2022-10-07 ENCOUNTER — Inpatient Hospital Stay (HOSPITAL_COMMUNITY)
Admission: RE | Admit: 2022-10-07 | Discharge: 2022-10-07 | Disposition: A | Discharge status: Home / Self Care | Payer: Medicaid Other | Source: Ambulatory Visit | Attending: Cardiology | Admitting: Cardiology

## 2022-10-07 VITALS — BP 130/80 | HR 50 | Wt 234.0 lb

## 2022-10-07 DIAGNOSIS — F17200 Nicotine dependence, unspecified, uncomplicated: Secondary | ICD-10-CM | POA: Diagnosis not present

## 2022-10-07 DIAGNOSIS — F17210 Nicotine dependence, cigarettes, uncomplicated: Secondary | ICD-10-CM | POA: Diagnosis not present

## 2022-10-07 DIAGNOSIS — Z79899 Other long term (current) drug therapy: Secondary | ICD-10-CM | POA: Diagnosis not present

## 2022-10-07 DIAGNOSIS — I480 Paroxysmal atrial fibrillation: Secondary | ICD-10-CM | POA: Insufficient documentation

## 2022-10-07 DIAGNOSIS — R002 Palpitations: Secondary | ICD-10-CM

## 2022-10-07 DIAGNOSIS — R0600 Dyspnea, unspecified: Secondary | ICD-10-CM | POA: Insufficient documentation

## 2022-10-07 DIAGNOSIS — I10 Essential (primary) hypertension: Secondary | ICD-10-CM | POA: Diagnosis not present

## 2022-10-07 DIAGNOSIS — R42 Dizziness and giddiness: Secondary | ICD-10-CM | POA: Insufficient documentation

## 2022-10-07 DIAGNOSIS — I2510 Atherosclerotic heart disease of native coronary artery without angina pectoris: Secondary | ICD-10-CM

## 2022-10-07 DIAGNOSIS — Z7901 Long term (current) use of anticoagulants: Secondary | ICD-10-CM | POA: Diagnosis not present

## 2022-10-07 DIAGNOSIS — I428 Other cardiomyopathies: Secondary | ICD-10-CM | POA: Diagnosis not present

## 2022-10-07 DIAGNOSIS — I130 Hypertensive heart and chronic kidney disease with heart failure and stage 1 through stage 4 chronic kidney disease, or unspecified chronic kidney disease: Secondary | ICD-10-CM | POA: Insufficient documentation

## 2022-10-07 DIAGNOSIS — N183 Chronic kidney disease, stage 3 unspecified: Secondary | ICD-10-CM | POA: Insufficient documentation

## 2022-10-07 DIAGNOSIS — I5022 Chronic systolic (congestive) heart failure: Secondary | ICD-10-CM

## 2022-10-07 DIAGNOSIS — I4819 Other persistent atrial fibrillation: Secondary | ICD-10-CM | POA: Diagnosis not present

## 2022-10-07 DIAGNOSIS — IMO0001 Reserved for inherently not codable concepts without codable children: Secondary | ICD-10-CM

## 2022-10-07 LAB — BASIC METABOLIC PANEL
Anion gap: 5 (ref 5–15)
BUN: 24 mg/dL — ABNORMAL HIGH (ref 8–23)
CO2: 25 mmol/L (ref 22–32)
Calcium: 8.8 mg/dL — ABNORMAL LOW (ref 8.9–10.3)
Chloride: 109 mmol/L (ref 98–111)
Creatinine, Ser: 1.5 mg/dL — ABNORMAL HIGH (ref 0.61–1.24)
GFR, Estimated: 49 mL/min — ABNORMAL LOW (ref >60)
Glucose, Bld: 115 mg/dL — ABNORMAL HIGH (ref 70–99)
Potassium: 4.1 mmol/L (ref 3.5–5.1)
Sodium: 139 mmol/L (ref 135–145)

## 2022-10-07 LAB — CBC
HCT: 34.4 % — ABNORMAL LOW (ref 39.0–52.0)
Hemoglobin: 11.4 g/dL — ABNORMAL LOW (ref 13.0–17.0)
MCH: 30.9 pg (ref 26.0–34.0)
MCHC: 33.1 g/dL (ref 30.0–36.0)
MCV: 93.2 fL (ref 80.0–100.0)
Platelets: 224 10*3/uL (ref 150–400)
RBC: 3.69 MIL/uL — ABNORMAL LOW (ref 4.22–5.81)
RDW: 13.9 % (ref 11.5–15.5)
WBC: 7.4 10*3/uL (ref 4.0–10.5)
nRBC: 0 % (ref 0.0–0.2)

## 2022-10-07 LAB — TSH: TSH: 3.663 u[IU]/mL (ref 0.350–4.500)

## 2022-10-07 NOTE — Patient Instructions (Signed)
It was great to see you today! No medication changes are needed at this time.   Labs today We will only contact you if something comes back abnormal or we need to make some changes. Otherwise no news is good news!  Your provider has recommended that  you wear a Zio Patch for 14 days.  This monitor will record your heart rhythm for our review.  IF you have any symptoms while wearing the monitor please press the button.  If you have any issues with the patch or you notice a red or orange light on it please call the company at [phone].  Once you remove the patch please mail it back to the company as soon as possible so we can get the results.   Your physician wants you to follow-up in: 6 months with Dr Shirlee Latch. You will receive a reminder letter in the mail two months in advance. If you don't receive a letter, please call our office to schedule the follow-up appointment.  Do the following things EVERYDAY: Weigh yourself in the morning before breakfast. Write it down and keep it in a log. Take your medicines as prescribed Eat low salt foods--Limit salt (sodium) to 2000 mg per day.  Stay as active as you can everyday Limit all fluids for the day to less than 2 liters  At the [facility], you and your health needs are our priority. As part of our continuing mission to provide you with exceptional heart care, we have created designated Provider Care Teams. These Care Teams include your primary Cardiologist (physician) and Advanced Practice Providers (APPs- Physician Assistants and Nurse Practitioners) who all work together to provide you with the care you need, when you need it.   You may see any of the following providers on your designated Care Team at your next follow up: Dr Arvilla Meres Dr Marca Ancona Dr. Marcos Eke, NP Robbie Lis, [place] [facility] [place], [place] Brynda Peon, NP Karle Plumber, PharmD   Please be sure to bring in  all your medications bottles to every appointment.   If you have any questions or concerns before your next appointment please send [place] a message through [place] or call our office at [phone].    TO LEAVE A MESSAGE FOR THE NURSE SELECT OPTION 2, PLEASE LEAVE A MESSAGE INCLUDING: YOUR NAME DATE OF BIRTH CALL BACK NUMBER REASON FOR CALL**this is important as we prioritize the call backs  YOU WILL RECEIVE A CALL BACK THE SAME DAY AS LONG AS YOU CALL BEFORE 4:00 PM

## 2022-10-30 NOTE — Addendum Note (Signed)
Encounter addended by: Crissie Figures, RN on: 10/30/2022 10:55 AM  Actions taken: Imaging Exam ended

## 2022-12-22 ENCOUNTER — Other Ambulatory Visit (HOSPITAL_COMMUNITY): Payer: Self-pay | Admitting: Cardiology

## 2023-01-05 ENCOUNTER — Other Ambulatory Visit (HOSPITAL_COMMUNITY): Payer: Self-pay | Admitting: Cardiology

## 2023-01-22 ENCOUNTER — Other Ambulatory Visit (HOSPITAL_COMMUNITY): Payer: Self-pay | Admitting: Cardiology

## 2023-05-06 ENCOUNTER — Ambulatory Visit (INDEPENDENT_AMBULATORY_CARE_PROVIDER_SITE_OTHER): Payer: 59 | Admitting: Nurse Practitioner

## 2023-05-06 ENCOUNTER — Encounter: Payer: Self-pay | Admitting: Nurse Practitioner

## 2023-05-06 VITALS — BP 130/78 | HR 53 | Ht 74.0 in | Wt 239.6 lb

## 2023-05-06 DIAGNOSIS — M12819 Other specific arthropathies, not elsewhere classified, unspecified shoulder: Secondary | ICD-10-CM

## 2023-05-06 DIAGNOSIS — M25561 Pain in right knee: Secondary | ICD-10-CM | POA: Diagnosis not present

## 2023-05-06 DIAGNOSIS — F17210 Nicotine dependence, cigarettes, uncomplicated: Secondary | ICD-10-CM | POA: Diagnosis not present

## 2023-05-06 DIAGNOSIS — Z0000 Encounter for general adult medical examination without abnormal findings: Secondary | ICD-10-CM

## 2023-05-06 DIAGNOSIS — Z8639 Personal history of other endocrine, nutritional and metabolic disease: Secondary | ICD-10-CM

## 2023-05-06 DIAGNOSIS — K089 Disorder of teeth and supporting structures, unspecified: Secondary | ICD-10-CM

## 2023-05-06 DIAGNOSIS — I5022 Chronic systolic (congestive) heart failure: Secondary | ICD-10-CM

## 2023-05-06 DIAGNOSIS — D6869 Other thrombophilia: Secondary | ICD-10-CM

## 2023-05-06 DIAGNOSIS — G8929 Other chronic pain: Secondary | ICD-10-CM

## 2023-05-06 DIAGNOSIS — I4819 Other persistent atrial fibrillation: Secondary | ICD-10-CM

## 2023-05-06 LAB — HEMOGLOBIN A1C
Est. average glucose Bld gHb Est-mCnc: 126 mg/dL
Hgb A1c MFr Bld: 6 % — ABNORMAL HIGH (ref 4.8–5.6)

## 2023-05-06 NOTE — Progress Notes (Signed)
Primary Care & Sports Medicine [facility] at [facility] [address]  [address] [place], [place]  [address] [phone]   MEDICARE Fenton Malling VISIT  06/28/2023  Subjective:  Sean Barnett is a 72 y.o. male patient of Sean Barnett, Sung Amabile, NP who had a Medicare Annual Wellness Visit today. 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 he reports that he is socially active and does interact with friends/family regularly. he is moderately physically active and enjoys crossword puzzles and Holiday representative work.  Patient Care Team: Caytlin Better, Sung Amabile, NP as PCP - General (Nurse Practitioner)     05/06/2023   10:09 AM 07/27/2022   10:13 AM 07/08/2021   11:26 AM 03/25/2020    1:28 PM 12/07/2019    8:38 AM 11/30/2019   12:05 PM 09/27/2019    8:49 AM  Advanced Directives  Does Patient Have a Medical Advance Directive? Yes No Yes No No No No  Type of Estate agent of [place];Living will  Living will      Copy of Healthcare Power of Attorney in Chart? No - copy requested        Would patient like information on creating a medical advance directive?  No - Patient declined  No - Patient declined  No - Guardian declined No - Patient declined    Hospital Utilization Over the Past 12 Months: # of hospitalizations or ER visits: 1 # of surgeries: 0  Review of Systems    Patient reports that his overall health is unchanged when compared to last year.  Review of Systems: Negative except   Right knee pain and feeling of giving out.   All other systems negative.  Pain Assessment  2/10   [name] presents today for his annual physical examination. He has a history of atrial fibrillation, which is currently well-controlled with medication management. He reports one episode last year, but his condition has since stabilized. His atrial fibrillation was initially diagnosed  after an urgent care visit for an infected elbow, and he subsequently underwent catheterization in 2021. Since starting medications, he has experienced improved sleep quality.  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 He opted for conservative management of these issues, declining surgical intervention for his rotator cuff tear. He reports recent concerns with his right knee, describing a sensation of looseness and feeling as though it wants to "come apart." This sensation has been present for the past few weeks and is particularly noticeable when moving his leg while lying in bed.  Although officially retired, [name] continues to engage in small jobs to remain active. He resides with his brother, who has a history of bypass surgery and colon cancer. [name] dietary habits consist of one and a half meals per day, with frequent snacking on candy throughout the day. He abstains from alcohol consumption due to his atrial fibrillation diagnosis and has limited social activities.  Current Medications & Allergies (verified) Allergies as of 05/06/2023       Reactions   Sulfa Antibiotics Swelling   "like a balloon"        Medication List        Accurate as of May 06, 2023 11:59 PM. If you have any questions, ask your nurse or doctor.          carvedilol 6.25 MG tablet  Commonly known as: COREG TAKE 1 TABLET BY MOUTH 2 TIMES DAILY WITH A MEAL.   Eliquis 5 MG Tabs tablet Generic drug: apixaban TAKE 1 TABLET BY MOUTH TWICE A DAY   Entresto 24-26 MG Generic drug: sacubitril-valsartan TAKE 1 TABLET BY MOUTH TWICE A DAY What changed: Another medication with the same name was removed. Continue taking this medication, and follow the directions you see here. Changed by: [name]   rosuvastatin 10 MG tablet Commonly known as: CRESTOR TAKE 1 TABLET BY MOUTH EVERY DAY        History (reviewed): Past  Medical History:  Diagnosis Date   Bursitis of elbow 03/2019   left elbow   CHF (congestive heart failure) (HCC)    DDD (degenerative disc disease), lumbar    Dyspnea 03/30/2019   Insomnia 03/30/2019   Nonischemic cardiomyopathy (HCC)    Nonobstructive CAD    Persistent atrial fibrillation (HCC)    Septic bursitis of elbow, left 04/13/2019   Past Surgical History:  Procedure Laterality Date   ATRIAL FIBRILLATION ABLATION N/A 12/07/2019   Procedure: ATRIAL FIBRILLATION ABLATION;  Surgeon: Hillis Range, MD;  Location: [facility] INVASIVE CV LAB;  Service: Cardiovascular;  Laterality: N/A;   CARDIOVERSION N/A 09/27/2019   Procedure: CARDIOVERSION;  Surgeon: Laurey Morale, MD;  Location: [facility] ENDOSCOPY;  Service: Cardiovascular;  Laterality: N/A;   CARDIOVERSION N/A 11/30/2019   Procedure: CARDIOVERSION;  Surgeon: Laurey Morale, MD;  Location: [facility] ENDOSCOPY;  Service: Cardiovascular;  Laterality: N/A;   DENTAL EXAMINATION UNDER ANESTHESIA     RIGHT/LEFT HEART CATH AND CORONARY ANGIOGRAPHY N/A 09/06/2019   Procedure: RIGHT/LEFT HEART CATH AND CORONARY ANGIOGRAPHY;  Surgeon: Laurey Morale, MD;  Location: [facility] INVASIVE CV LAB;  Service: Cardiovascular;  Laterality: N/A;   Family History  Problem Relation Age of Onset   Lung cancer Father    Social History   Socioeconomic History   Marital status: Widowed    Spouse name: Not on file   Number of children: Not on file   Years of education: Not on file   Highest education level: Not on file  Occupational History   Not on file  Tobacco Use   Smoking status: Every Day    Current packs/day: 1.00    Average packs/day: 1 pack/day for 50.0 years (50.0 ttl pk-yrs)    Types: Cigarettes   Smokeless tobacco: Never   Tobacco comments:    trying to "cut down"  Vaping Use   Vaping status: Never Used  Substance and Sexual Activity   Alcohol use: Yes    Alcohol/week: 2.0 standard drinks of alcohol    Types: 1 Cans of beer, 1 Standard drinks or  equivalent per week    Comment: rare   Drug use: Never   Sexual activity: Not on file  Other Topics Concern   Not on file  Social History Narrative   Lives in [place] alone   [place] (self employed)   Social Determinants of Health   Financial Resource Strain: Low Risk  (05/06/2023)   Overall Financial Resource Strain (CARDIA)    Difficulty of Paying Living Expenses: Not hard at all  Food Insecurity: No Food Insecurity (05/06/2023)   Hunger Vital Sign    Worried About Running Out of Food in the Last Year: Never true    Ran Out of Food in the Last Year: Never true  Transportation Needs: No Transportation Needs (05/06/2023)   PRAPARE - Administrator, Civil Service (Medical): No  Lack of Transportation (Non-Medical): No  Physical Activity: Sufficiently Active (05/06/2023)   Exercise Vital Sign    Days of Exercise per Week: 4 days    Minutes of Exercise per Session: 50 min  Stress: Not on file  Social Connections: Moderately Isolated (05/06/2023)   Social Connection and Isolation Panel [NHANES]    Frequency of Communication with Friends and Family: More than three times a week    Frequency of Social Gatherings with Friends and Family: More than three times a week    Attends Religious Services: Never    Database administrator or Organizations: Yes    Attends Banker Meetings: 1 to 4 times per year    Marital Status: Widowed    Activities of Daily Living    05/06/2023   10:10 AM 07/27/2022   10:09 AM  In your present state of health, do you have any difficulty performing the following activities:  Hearing? 1 0  Comment little losing in rt. ear,   Vision? 0 1  Comment  needs reading glasses  Difficulty concentrating or making decisions? 0 0  Walking or climbing stairs? 0 1  Comment  knee pain  Dressing or bathing? 0 0  Doing errands, shopping? 0 0  Preparing Food and eating ? N   Using the Toilet? N   In the past six months, have you accidently leaked  urine? N   Do you have problems with loss of bowel control? N   Managing your Medications? N   Managing your Finances? N   Housekeeping or managing your Housekeeping? N    Physical Exam Vitals and nursing note reviewed.  Constitutional:      Appearance: Normal appearance. He is obese. He is not ill-appearing.  HENT:     Head: Normocephalic.     Right Ear: Tympanic membrane normal.     Left Ear: Tympanic membrane normal.     Nose: Nose normal.     Mouth/Throat:     Mouth: Mucous membranes are moist.     Pharynx: Oropharynx is clear.  Eyes:     Conjunctiva/sclera: Conjunctivae normal.  Neck:     Vascular: No carotid bruit.  Cardiovascular:     Rate and Rhythm: Normal rate. Rhythm irregular.     Pulses: Normal pulses.     Heart sounds: Normal heart sounds.  Pulmonary:     Effort: Pulmonary effort is normal.     Breath sounds: Normal breath sounds.  Abdominal:     General: Bowel sounds are normal. There is no distension.     Palpations: Abdomen is soft.     Tenderness: There is no abdominal tenderness. There is no right CVA tenderness, left CVA tenderness or guarding.  Musculoskeletal:     Right lower leg: No edema.     Left lower leg: No edema.  Lymphadenopathy:     Cervical: No cervical adenopathy.  Skin:    General: Skin is warm and dry.     Capillary Refill: Capillary refill takes less than 2 seconds.  Neurological:     Mental Status: He is alert and oriented to person, place, and time.     Sensory: No sensory deficit.     Motor: No weakness.  Psychiatric:        Mood and Affect: Mood normal.        Behavior: Behavior normal.     Patient Education/Literacy  Manages own literature and writing  Exercise Current Exercise Habits: The patient has a  physically strenuous job, but has no regular exercise apart from work.  Diet Patient reports consuming 2 meals a day and 2 snack(s) a day Patient reports that his primary diet is: Regular Patient reports that she does  have regular access to food.   Depression Screen    05/06/2023   10:05 AM 07/27/2022   10:09 AM 07/08/2021   11:25 AM 03/25/2020    2:37 PM 07/11/2019    1:24 PM 04/11/2019    8:58 AM  PHQ 2/9 Scores  PHQ - 2 Score 0 0 0 0 1 0  PHQ- 9 Score   0  5      Fall Risk    05/06/2023   10:05 AM 07/27/2022   10:08 AM 07/08/2021   11:25 AM 03/25/2020    1:29 PM 07/11/2019    2:57 PM  Fall Risk   Falls in the past year? 0 0 0 1 1  Number falls in past yr: 0 0  0 1  Comment     tripping over stuff on "job sites"  Injury with Fall? 0 0  0 0  Risk for fall due to : No Fall Risks No Fall Risks No Fall Risks  History of fall(s)  Follow up Falls evaluation completed Falls evaluation completed;Falls prevention discussed   Falls prevention discussed     Objective:   BP 130/78   Pulse (!) 53   Ht 6\' 2"  (1.88 m)   Wt 239 lb 9.6 oz (108.7 kg)   BMI 30.76 kg/m   Last Weight  Most recent update: 05/06/2023 10:08 AM    Weight  108.7 kg (239 lb 9.6 oz)             Body mass index is 30.76 kg/m.  Hearing/Vision  Donzel did not have difficulty with hearing/understanding during the face-to-face interview Tristian did not have difficulty with his vision during the face-to-face interview Reports that he has not had a formal eye exam by an eye care professional within the past year Reports that he has not had a formal hearing evaluation within the past year  Cognitive Function:     No data to display          Normal Cognitive Function Screening: Yes (Normal:0-7, Significant for Dysfunction: >8)  Immunization & Health Maintenance Record Immunization History  Administered Date(s) Administered   Tdap 07/11/2019    Health Maintenance  Topic Date Due   Colonoscopy  Never done   Lung Cancer Screening  Never done   COVID-19 Vaccine (1 - 2023-24 season) Never done   Zoster Vaccines- Shingrix (1 of 2) 08/06/2023 (Originally 03/14/2001)   Pneumonia Vaccine 58+ Years old (1 of 2 - PCV) 05/05/2024  (Originally 03/14/1957)   INFLUENZA VACCINE  07/01/2023   Medicare Annual Wellness (AWV)  06/27/2024   DTaP/Tdap/Td (2 - Td or Tdap) 07/10/2029   Hepatitis C Screening  Completed   HPV VACCINES  Aged Out       Assessment  This is a routine wellness examination for [employer].  Health Maintenance: Due or Overdue Health Maintenance Due  Topic Date Due   Colonoscopy  Never done   Lung Cancer Screening  Never done   COVID-19 Vaccine (1 - 2023-24 season) Never done    [name] does not need a referral for Community Assistance: Care Management:   not applicable Social Work:    not applicable Prescription Assistance:  not applicable Nutrition/Diabetes Education:  not applicable   Plan:  Personalized  Goals  Goals Addressed             This Visit's Progress    Social and Functional Skills Optimized       Evidence-based guidance:  Assess level of social support; promote maintaining links with family, friends and community to reduce social isolation.  Assess level of function related to basic activities of daily living that include eating, dressing, bathing, as well as instrumental activities of daily living such as shopping, managing finances and use of devices.  Encourage continuation of daily life components such as self-care, home maintenance, financial management, volunteer activities, education opportunities and hobbies.  Refer to occupational or physical therapy to develop comprehensive rehabilitation plan to improve or maintain activities of daily living; consider inclusion of endurance, balance and resistance-training.  Consider complementary therapy such as yoga, music, gardening, outdoor activities, aromatherapy and tai chi.   Notes:        Personalized Health Maintenance & Screening Recommendations     Lung Cancer Screening Recommended: yes (Low Dose CT Chest recommended if Age 26-80 years, 30 pack-year currently smoking OR have quit w/in past 15  years) Hepatitis C Screening recommended: yes HIV Screening recommended: yes  Advanced Directives: Written information was not given per the patient's request.  Referrals & Orders Orders Placed This Encounter  Procedures   CBC with Differential/Platelet   Comprehensive metabolic panel   VITAMIN D 25 Hydroxy (Vit-D Deficiency, Fractures)   Hemoglobin A1c   Ambulatory Referral for Lung Cancer Scre    Follow-up Plan Follow-up with Elantra Caprara, Sung Amabile, NP as planned   I have personally reviewed and noted the following in the patient's chart:   Medical and social history Use of alcohol, tobacco or illicit drugs  Current medications and supplements Functional ability and status Nutritional status Physical activity Advanced directives List of other physicians Hospitalizations, surgeries, and ER visits in previous 12 months Vitals Screenings to include cognitive, depression, and falls Referrals and appointments  In addition, I have reviewed and discussed with patient certain preventive protocols, quality metrics, and best practice recommendations. A written personalized care plan for preventive services as well as general preventive health recommendations were provided to patient.     [name], DNP, AGNP-c   06/28/2023

## 2023-05-06 NOTE — Patient Instructions (Addendum)
I sent the order for the Lung Cancer Screening. They will call you to schedule this.    For all adult patients, I recommend A well balanced diet low in saturated fats, cholesterol, and moderation in carbohydrates.   This can be as simple as monitoring portion sizes and cutting back on sugary beverages such as soda and juice to start with.    Daily water consumption of at least 64 ounces.  Physical activity at least 180 minutes per week, if just starting out.   This can be as simple as taking the stairs instead of the elevator and walking 2-3 laps around the office  purposefully every day.   STD protection, partner selection, and regular testing if high risk.  Limited consumption of alcoholic beverages if alcohol is consumed.  For women, I recommend no more than 7 alcoholic beverages per week, spread out throughout the week.  Avoid "binge" drinking or consuming large quantities of alcohol in one setting.   Please let me know if you feel you may need help with reduction or quitting alcohol consumption.   Avoidance of nicotine, if used.  Please let me know if you feel you may need help with reduction or quitting nicotine use.   Daily mental health attention.  This can be in the form of 5 minute daily meditation, prayer, journaling, yoga, reflection, etc.   Purposeful attention to your emotions and mental state can significantly improve your overall wellbeing  and  Health.  Please know that I am here to help you with all of your health care goals and am happy to work with you to find a solution that works best for you.  The greatest advice I have received with any changes in life are to take it one step at a time, that even means if all you can focus on is the next 60 seconds, then do that and celebrate your victories.  With any changes in life, you will have set backs, and that is OK. The important thing to remember is, if you have a set back, it is not a failure, it is an opportunity to try  again!  Health Maintenance Recommendations Screening Testing Mammogram Every 1 -2 years based on history and risk factors Starting at age 72 Pap Smear Ages 72-39 every 3 years Ages 72-65 every 5 years with HPV testing More frequent testing may be required based on results and history Colon Cancer Screening Every 1-10 years based on test performed, risk factors, and history Starting at age 72 Bone Density Screening Every 2-10 years based on history Starting at age 72 for women Recommendations for men differ based on medication usage, history, and risk factors AAA Screening One time ultrasound Men 72-89 years old who have every smoked Lung Cancer Screening Low Dose Lung CT every 12 months Age 72-80 years with a 30 pack-year smoking history who still smoke or who have quit within the last 15 years  Screening Labs Routine  Labs: Complete Blood Count (CBC), Complete Metabolic Panel (CMP), Cholesterol (Lipid Panel) Every 6-12 months based on history and medications May be recommended more frequently based on current conditions or previous results Hemoglobin A1c Lab Every 3-12 months based on history and previous results Starting at age 72 or earlier with diagnosis of diabetes, high cholesterol, BMI >26, and/or risk factors Frequent monitoring for patients with diabetes to ensure blood sugar control Thyroid Panel (TSH w/ T3 & T4) Every 6 months based on history, symptoms, and risk factors May be  repeated more often if on medication HIV One time testing for all patients 13 and older May be repeated more frequently for patients with increased risk factors or exposure Hepatitis C One time testing for all patients 11 and older May be repeated more frequently for patients with increased risk factors or exposure Gonorrhea, Chlamydia Every 12 months for all sexually active persons 13-24 years Additional monitoring may be recommended for those who are considered high risk or who have  symptoms PSA Men 17-3 years old with risk factors Additional screening may be recommended from age 41-69 based on risk factors, symptoms, and history  Vaccine Recommendations Tetanus Booster All adults every 10 years Flu Vaccine All patients 6 months and older every year COVID Vaccine All patients 12 years and older Initial dosing with booster May recommend additional booster based on age and health history HPV Vaccine 2 doses all patients age 61-26 Dosing may be considered for patients over 26 Shingles Vaccine (Shingrix) 2 doses all adults 55 years and older Pneumonia (Pneumovax 23) All adults 65 years and older May recommend earlier dosing based on health history Pneumonia (Prevnar 84) All adults 65 years and older Dosed 1 year after Pneumovax 23  Additional Screening, Testing, and Vaccinations may be recommended on an individualized basis based on family history, health history, risk factors, and/or exposure.

## 2023-05-07 LAB — COMPREHENSIVE METABOLIC PANEL
ALT: 14 IU/L (ref 0–44)
AST: 14 IU/L (ref 0–40)
Albumin/Globulin Ratio: 1.4 (ref 1.2–2.2)
Albumin: 3.9 g/dL (ref 3.8–4.8)
Alkaline Phosphatase: 127 IU/L — ABNORMAL HIGH (ref 44–121)
BUN/Creatinine Ratio: 13 (ref 10–24)
BUN: 19 mg/dL (ref 8–27)
Bilirubin Total: 0.5 mg/dL (ref 0.0–1.2)
CO2: 21 mmol/L (ref 20–29)
Calcium: 8.8 mg/dL (ref 8.6–10.2)
Chloride: 106 mmol/L (ref 96–106)
Creatinine, Ser: 1.51 mg/dL — ABNORMAL HIGH (ref 0.76–1.27)
Globulin, Total: 2.8 g/dL (ref 1.5–4.5)
Glucose: 91 mg/dL (ref 70–99)
Potassium: 4.9 mmol/L (ref 3.5–5.2)
Sodium: 139 mmol/L (ref 134–144)
Total Protein: 6.7 g/dL (ref 6.0–8.5)
eGFR: 49 mL/min/{1.73_m2} — ABNORMAL LOW (ref >59)

## 2023-05-07 LAB — CBC WITH DIFFERENTIAL/PLATELET
Basophils Absolute: 0.1 10*3/uL (ref 0.0–0.2)
Basos: 1 %
EOS (ABSOLUTE): 0.3 10*3/uL (ref 0.0–0.4)
Eos: 3 %
Hematocrit: 38.2 % (ref 37.5–51.0)
Hemoglobin: 12.5 g/dL — ABNORMAL LOW (ref 13.0–17.7)
Immature Grans (Abs): 0 10*3/uL (ref 0.0–0.1)
Immature Granulocytes: 0 %
Lymphocytes Absolute: 2 10*3/uL (ref 0.7–3.1)
Lymphs: 25 %
MCH: 30.3 pg (ref 26.6–33.0)
MCHC: 32.7 g/dL (ref 31.5–35.7)
MCV: 93 fL (ref 79–97)
Monocytes Absolute: 0.5 10*3/uL (ref 0.1–0.9)
Monocytes: 5 %
Neutrophils Absolute: 5.5 10*3/uL (ref 1.4–7.0)
Neutrophils: 66 %
Platelets: 235 10*3/uL (ref 150–450)
RBC: 4.12 x10E6/uL — ABNORMAL LOW (ref 4.14–5.80)
RDW: 14.2 % (ref 11.6–15.4)
WBC: 8.3 10*3/uL (ref 3.4–10.8)

## 2023-05-07 LAB — VITAMIN D 25 HYDROXY (VIT D DEFICIENCY, FRACTURES): Vit D, 25-Hydroxy: 33.9 ng/mL (ref 30.0–100.0)

## 2023-06-07 ENCOUNTER — Other Ambulatory Visit (HOSPITAL_COMMUNITY): Payer: Self-pay | Admitting: Cardiology

## 2023-06-07 ENCOUNTER — Other Ambulatory Visit: Payer: Self-pay | Admitting: Internal Medicine

## 2023-06-07 DIAGNOSIS — I4819 Other persistent atrial fibrillation: Secondary | ICD-10-CM

## 2023-06-07 DIAGNOSIS — I482 Chronic atrial fibrillation, unspecified: Secondary | ICD-10-CM

## 2023-06-07 DIAGNOSIS — I5020 Unspecified systolic (congestive) heart failure: Secondary | ICD-10-CM

## 2023-06-07 DIAGNOSIS — I4891 Unspecified atrial fibrillation: Secondary | ICD-10-CM

## 2023-06-28 DIAGNOSIS — K089 Disorder of teeth and supporting structures, unspecified: Secondary | ICD-10-CM | POA: Insufficient documentation

## 2023-06-28 DIAGNOSIS — D6869 Other thrombophilia: Secondary | ICD-10-CM | POA: Insufficient documentation

## 2023-06-28 DIAGNOSIS — M12819 Other specific arthropathies, not elsewhere classified, unspecified shoulder: Secondary | ICD-10-CM | POA: Insufficient documentation

## 2023-06-28 DIAGNOSIS — Z8639 Personal history of other endocrine, nutritional and metabolic disease: Secondary | ICD-10-CM | POA: Insufficient documentation

## 2023-06-28 DIAGNOSIS — Z0000 Encounter for general adult medical examination without abnormal findings: Secondary | ICD-10-CM | POA: Insufficient documentation

## 2023-06-28 DIAGNOSIS — F17210 Nicotine dependence, cigarettes, uncomplicated: Secondary | ICD-10-CM | POA: Insufficient documentation

## 2023-06-28 NOTE — Assessment & Plan Note (Signed)
Patient reports recent right knee instability and feeling of giving away. Has history of meniscus injury. Plan:  - Advise continued use of knee sleeve for support - Monitor symptoms for 2 more weeks - Consider referral to orthopedics if no improvement

## 2023-06-28 NOTE — Assessment & Plan Note (Signed)
Patient has elected for conservative management of rotator cuff tear. Plan: - Encourage activity modification - Monitor for worsening of symptoms

## 2023-06-28 NOTE — Assessment & Plan Note (Signed)
Recommend screening CT

## 2023-06-28 NOTE — Assessment & Plan Note (Signed)
Labs pending.  

## 2023-06-28 NOTE — Assessment & Plan Note (Signed)
CPE completed today.   Labs ordered. Will make changes as necessary based on results.  Review of HM activities and recommendations discussed and provided on AVS Anticipatory guidance, diet, and exercise recommendations provided.  Medications, allergies, and hx reviewed and updated as necessary.  Plan to f/u with CPE in 1 year or sooner for acute/chronic health needs as directed.   

## 2023-06-28 NOTE — Assessment & Plan Note (Signed)
Patient reports good control of atrial fibrillation symptoms on current medication regimen. Rate controlled Plan: - Continue current medications: Entresto and Eliquis - Monitor for any changes in symptoms - Schedule follow-up appointment with cardiologist as needed

## 2023-06-28 NOTE — Assessment & Plan Note (Signed)
Chronic anticoagulation in the setting of chronic a-fib. Labs pending.

## 2023-06-28 NOTE — Assessment & Plan Note (Signed)
Patient has not had dental care for many years and is having difficulty eating. Plan: - Encourage seeking dental evaluation - Consider dentures if needed - Provide list of local dentists

## 2023-06-28 NOTE — Assessment & Plan Note (Signed)
Due to anticoagulants for afib. No bleeding signs. Labs pending.

## 2023-07-02 ENCOUNTER — Encounter (HOSPITAL_COMMUNITY): Payer: Self-pay | Admitting: Cardiology

## 2023-07-02 ENCOUNTER — Other Ambulatory Visit (HOSPITAL_COMMUNITY): Payer: Self-pay

## 2023-07-02 ENCOUNTER — Ambulatory Visit (HOSPITAL_COMMUNITY)
Admission: RE | Admit: 2023-07-02 | Discharge: 2023-07-02 | Disposition: A | Discharge status: Home / Self Care | Payer: 59 | Source: Ambulatory Visit | Attending: Cardiology | Admitting: Cardiology

## 2023-07-02 VITALS — BP 142/88 | HR 55 | Wt 237.6 lb

## 2023-07-02 DIAGNOSIS — Z7901 Long term (current) use of anticoagulants: Secondary | ICD-10-CM | POA: Insufficient documentation

## 2023-07-02 DIAGNOSIS — F17210 Nicotine dependence, cigarettes, uncomplicated: Secondary | ICD-10-CM | POA: Insufficient documentation

## 2023-07-02 DIAGNOSIS — I428 Other cardiomyopathies: Secondary | ICD-10-CM | POA: Insufficient documentation

## 2023-07-02 DIAGNOSIS — Z79899 Other long term (current) drug therapy: Secondary | ICD-10-CM | POA: Insufficient documentation

## 2023-07-02 DIAGNOSIS — I480 Paroxysmal atrial fibrillation: Secondary | ICD-10-CM | POA: Insufficient documentation

## 2023-07-02 DIAGNOSIS — N183 Chronic kidney disease, stage 3 unspecified: Secondary | ICD-10-CM | POA: Diagnosis not present

## 2023-07-02 DIAGNOSIS — R0602 Shortness of breath: Secondary | ICD-10-CM | POA: Insufficient documentation

## 2023-07-02 DIAGNOSIS — I130 Hypertensive heart and chronic kidney disease with heart failure and stage 1 through stage 4 chronic kidney disease, or unspecified chronic kidney disease: Secondary | ICD-10-CM | POA: Insufficient documentation

## 2023-07-02 DIAGNOSIS — I5022 Chronic systolic (congestive) heart failure: Secondary | ICD-10-CM | POA: Insufficient documentation

## 2023-07-02 DIAGNOSIS — I2510 Atherosclerotic heart disease of native coronary artery without angina pectoris: Secondary | ICD-10-CM | POA: Diagnosis not present

## 2023-07-02 LAB — LIPID PANEL
Cholesterol: 79 mg/dL (ref 0–200)
HDL: 28 mg/dL — ABNORMAL LOW (ref >40)
LDL Cholesterol: 41 mg/dL (ref 0–99)
Total CHOL/HDL Ratio: 2.8 RATIO
Triglycerides: 50 mg/dL (ref <150)
VLDL: 10 mg/dL (ref 0–40)

## 2023-07-02 LAB — BASIC METABOLIC PANEL
Anion gap: 7 (ref 5–15)
BUN: 16 mg/dL (ref 8–23)
CO2: 24 mmol/L (ref 22–32)
Calcium: 8.6 mg/dL — ABNORMAL LOW (ref 8.9–10.3)
Chloride: 107 mmol/L (ref 98–111)
Creatinine, Ser: 1.42 mg/dL — ABNORMAL HIGH (ref 0.61–1.24)
GFR, Estimated: 53 mL/min — ABNORMAL LOW (ref >60)
Glucose, Bld: 104 mg/dL — ABNORMAL HIGH (ref 70–99)
Potassium: 4.1 mmol/L (ref 3.5–5.1)
Sodium: 138 mmol/L (ref 135–145)

## 2023-07-02 MED ORDER — DAPAGLIFLOZIN PROPANEDIOL 10 MG PO TABS: 10.0000 mg | ORAL_TABLET | Freq: Every day | ORAL | 3 refills | Status: DC

## 2023-07-02 MED ORDER — VARENICLINE TARTRATE (STARTER) 0.5 MG X 11 & 1 MG X 42 PO TBPK: ORAL_TABLET | ORAL | 0 refills | Status: DC

## 2023-07-02 MED ORDER — DAPAGLIFLOZIN PROPANEDIOL 10 MG PO TABS: 10.0000 mg | ORAL_TABLET | Freq: Every day | ORAL | 11 refills | Status: DC

## 2023-07-02 NOTE — Patient Instructions (Signed)
START Farxiga 10 mg daily.  START Chantix as directed on the box.  Labs done today, your results will be available in MyChart, we will contact you for abnormal readings.  Your physician has requested that you have an echocardiogram. Echocardiography is a painless test that uses sound waves to create images of your heart. It provides your doctor with information about the size and shape of your heart and how well your heart's chambers and valves are working. This procedure takes approximately one hour. There are no restrictions for this procedure. Please do NOT wear cologne, perfume, aftershave, or lotions (deodorant is allowed). Please arrive 15 minutes prior to your appointment time.  Your physician recommends that you schedule a follow-up appointment in: 4 months  If you have any questions or concerns before your next appointment please send [place] a message through [place] or call our office at [phone].    TO LEAVE A MESSAGE FOR THE NURSE SELECT OPTION 2, PLEASE LEAVE A MESSAGE INCLUDING: YOUR NAME DATE OF BIRTH CALL BACK NUMBER REASON FOR CALL**this is important as we prioritize the call backs  YOU WILL RECEIVE A CALL BACK THE SAME DAY AS LONG AS YOU CALL BEFORE 4:00 PM  At the [facility], you and your health needs are our priority. As part of our continuing mission to provide you with exceptional heart care, we have created designated Provider Care Teams. These Care Teams include your primary Cardiologist (physician) and Advanced Practice Providers (APPs- Physician Assistants and Nurse Practitioners) who all work together to provide you with the care you need, when you need it.   You may see any of the following providers on your designated Care Team at your next follow up: Dr Arvilla Meres Dr Marca Ancona Dr. Marcos Eke, NP Robbie Lis, [place] [facility] [place], [place] Brynda Peon, NP Karle Plumber, PharmD   Please be sure to  bring in all your medications bottles to every appointment.    Thank you for choosing [facility]

## 2023-07-04 NOTE — Progress Notes (Signed)
PCP: [name], NP Cardiology: Dr. Shirlee Latch  72 y.o. with history of HTN and smoking was recently diagnosed with atrial fibrillation and chronic systolic CHF.  Patient was admitted in 4/20 with left elbow septic bursitis.  Several weeks prior to the admission, he had been feeling palpitations and had been tiring more easily.  At the time of admission, he reported chest pressure.  He was found to be in atrial fibrillation with RVR.  Echo was done, showing EF depressed to 30-35%.  He was started on Coreg and it was titrated up to control his HR.  He was started on Eliquis.  He was not cardioverted.   In 10/20, he had LHC/RHC showing moderate nonobstructive CAD, elevated PCWP, and CI 1.85.  He was started on digoxin.  Three weeks later after restarting apixaban he had had successful DCCV to NSR later in 10/20.   He had recurrent atrial fibrillation and failed DCCV in 12/20.  He then had atrial fibrillation ablation in 1/21.  He is now off amiodarone.   Cardiac MRI (1/21) with LV EF 47%, RV EF 46%, basal inferolateral and apical lateral LGE, looks like coronary disease pattern.   Echo in 9/21 showed EF improved to 55-60%, normal RV.  Echo 5/23 showed EF 55% with normal RV, mild biatrial enlargement.    Today he returns for HF follow up. He is in NSR today with no recent palpitations.  He is still smoking 1 ppd.  He works as a Surveyor, minerals, has been cutting back recently.  Under stress with sick brother living with him. Not as active in the summer due to heat (tries not to work much in the summer).  He is short of breath walking up 2 flights of stairs.  No dyspnea walking on flat ground. No chest pain. Able to push mow his yard.    ECG (personally reviewed): NSR, nonspecific T wave flattening  Labs (5/20): LDL 65 Labs (8/20): K 4.5, creatinine 1.1, hgb 13.1 Labs (11/20): K 4, creatinine 1.08, hgb 15, digoxin 0.7 Labs (12/20): LDL 56 Labs (1/21): K 4.3, creatinine 1.2 Labs (2/21): K 4.4, creatinine  1.19 Labs (7/21): K 4.9, creatinine 1.69 Labs (10/21): K 4.6, creatinine 1.69 Labs (4/22): K 4.7, creatinine 1.47 Labs (10/22): K 4.3, creatinine 1.49 Labs (5/23): K 4.3, creatinine 1.34 Labs (8/23): LDL 46, HDL 32 Labs (6/24): K 4.9, creatinine 1.5, hgb 12.5  PMH: 1. HTN 2. Atrial fibrillation: Paroxysmal. - DCCV to NSR in 10/20.  - Atrial fibrillation ablation 1/21.  3. Chronic systolic CHF: Nonischemic cardiomyopathy.  echo (4/20) with EF 30-35%, mildly decreased RV systolic function.  - LHC/RHC (10/20): 60% mid LAD, 40% PLOM, 50% dRCA; mean RA 2, PA 45/20, mean PCWP 22, CI 1.85, PVR 2.3 WU.  - Cardiac MRI (1/21): LV EF 47%, RV EF 46%, basal inferolateral and apical lateral LGE, looks like coronary disease pattern.  - Echo (9/21): EF 55-60% with normal RV.  - Echo (5/23): EF 55% with normal RV, mild biatrial enlargement.  4. H/o septic bursitis 5. CAD: LHC in 10/20 with moderate nonobstructive disease (see above).  6. CKD stage 3  SH: Works as Music therapist, widowed, smokes 1 ppd. Lives in [place].   FH: Father with lung cancer.  Brother with CABG in his 60s.   ROS: All systems reviewed and negative except as per HPI.   Current Outpatient Medications  Medication Sig Dispense Refill   carvedilol (COREG) 6.25 MG tablet TAKE 1 TABLET BY MOUTH TWICE A DAY  WITH FOOD 180 tablet 3   ELIQUIS 5 MG TABS tablet TAKE 1 TABLET BY MOUTH TWICE A DAY 180 tablet 3   ENTRESTO 24-26 MG TAKE 1 TABLET BY MOUTH TWICE A DAY 60 tablet 11   rosuvastatin (CRESTOR) 10 MG tablet TAKE 1 TABLET BY MOUTH EVERY DAY 90 tablet 2   Varenicline Tartrate, Starter, (CHANTIX STARTING MONTH PAK) 0.5 MG X 11 & 1 MG X 42 TBPK Day 1-3 .5mg  daily, Day 4-7 .5mg  Twice daily day 8 onwards 1 mg Twice daily 53 each 0   dapagliflozin propanediol (FARXIGA) 10 MG TABS tablet Take 1 tablet (10 mg total) by mouth daily before breakfast. 90 tablet 3   No current facility-administered medications for this encounter.   Wt Readings  from Last 3 Encounters:  07/02/23 107.8 kg (237 lb 9.6 oz)  05/06/23 108.7 kg (239 lb 9.6 oz)  10/07/22 106.1 kg (234 lb)   BP (!) 142/88   Pulse (!) 55   Wt 107.8 kg (237 lb 9.6 oz)   SpO2 97%   BMI 30.51 kg/m  General: NAD Neck: No JVD, no thyromegaly or thyroid nodule.  Lungs: Clear to auscultation bilaterally with normal respiratory effort. CV: Nondisplaced PMI.  Heart regular S1/S2, no S3/S4, no murmur.  No peripheral edema.  No carotid bruit.  Normal pedal pulses.  Abdomen: Soft, nontender, no hepatosplenomegaly, no distention.  Skin: Intact without lesions or rashes.  Neurologic: Alert and oriented x 3.  Psych: Normal affect. Extremities: No clubbing or cyanosis.  HEENT: Normal.   Assessment/Plan: 1. Chronic systolic CHF: Echo in 4/20 with EF 30-35%, mildly decreased RV systolic function.  Nonischemic cardiomyopathy, RHC/LHC in 10/20 showed nonobstructive CAD and low output with CI 1.85.  It is possible that this is a tachycardia-mediated cardiomyopathy with EF down because of atrial fibrillation.  It is also possible that the cardiomyopathy is from some other cause and that the cardiomyopathy itself predisposed him to atrial fibrillation.  Cardiac MRI in 1/21 showed some improvement, with LV EF 47% and normal RV, there was coronary pattern LGE in the inferolateral wall.  Echo in 9/21 showed EF up to 55-60% with normal RV, echo in 5/23 similarly showed EF 55% with normal RV, mild biatrial enlargement.  NYHA class II, not volume overloaded on exam.  - Continue Coreg 6.25 mg bid, dose decreased in the past due to bradycardia.   - Continue Entresto 24/26 bid.  - I will have him start Farxiga with CKD stage 3.  BMET today and again in 10 days.  - He is off spironolactone, with improved EF, I think he can stay off (but would continue Entresto and Coreg).   - Repeat echo.  2. Atrial fibrillation: Paroxysmal.  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  He  had DCCV to NSR in 10/20 but afib recurred.  He had atrial fibrillation ablation in 1/21.  He is in sinus today. Amiodarone was stopped post-ablation.   - Continue Eliquis 5 mg bid.  3. Smoking: I strongly recommended that he quit.   - I gave him a prescription today for Chantix.  4. HTN: BP mildly elevated today, has been normal otherwise recently.    5. CAD: Moderate nonobstructive CAD on 10/20 cath. No chest pain. - No ASA given apixaban use.  - Continue Crestor 10 mg daily, check lipids today.   Followup in 4 months with APP.   Marca Ancona  07/04/2023

## 2023-07-22 ENCOUNTER — Ambulatory Visit (HOSPITAL_COMMUNITY)
Admission: RE | Admit: 2023-07-22 | Discharge: 2023-07-22 | Disposition: A | Discharge status: Home / Self Care | Payer: 59 | Source: Ambulatory Visit | Attending: Cardiology | Admitting: Cardiology

## 2023-07-22 DIAGNOSIS — I77819 Aortic ectasia, unspecified site: Secondary | ICD-10-CM | POA: Insufficient documentation

## 2023-07-22 DIAGNOSIS — I5022 Chronic systolic (congestive) heart failure: Secondary | ICD-10-CM | POA: Diagnosis present

## 2023-07-22 LAB — ECHOCARDIOGRAM COMPLETE
Area-P 1/2: 2.77 cm2
Calc EF: 65.6 %
S' Lateral: 3.9 cm
Single Plane A2C EF: 63.2 %
Single Plane A4C EF: 67.2 %

## 2023-07-22 NOTE — Progress Notes (Signed)
Echocardiogram 2D Echocardiogram has been performed.  Sean Barnett 07/22/2023, 11:56 AM

## 2023-08-11 ENCOUNTER — Telehealth (HOSPITAL_COMMUNITY): Payer: Self-pay | Admitting: *Deleted

## 2023-08-11 NOTE — Telephone Encounter (Signed)
Received fax from [facility], pt is sch for teeth extractions and they need recommendations on holding Eliquis  Per Dr Shirlee Latch: "He can stoop Eliquis if dentist requires 2 days prior and day of procedure"  Form faxed back to them at [phone]

## 2023-09-22 ENCOUNTER — Other Ambulatory Visit (HOSPITAL_COMMUNITY): Payer: Self-pay | Admitting: Cardiology

## 2023-10-22 ENCOUNTER — Encounter: Payer: Self-pay | Admitting: Nurse Practitioner

## 2023-10-22 ENCOUNTER — Ambulatory Visit (INDEPENDENT_AMBULATORY_CARE_PROVIDER_SITE_OTHER): Payer: 59 | Admitting: Nurse Practitioner

## 2023-10-22 ENCOUNTER — Ambulatory Visit
Admission: RE | Admit: 2023-10-22 | Discharge: 2023-10-22 | Disposition: A | Discharge status: Home / Self Care | Payer: 59 | Source: Ambulatory Visit | Attending: Nurse Practitioner

## 2023-10-22 VITALS — BP 160/80 | HR 64 | Ht 74.0 in | Wt 236.2 lb

## 2023-10-22 DIAGNOSIS — M545 Low back pain, unspecified: Secondary | ICD-10-CM | POA: Diagnosis not present

## 2023-10-22 LAB — POCT URINALYSIS DIP (PROADVANTAGE DEVICE)
Bilirubin, UA: NEGATIVE
Glucose, UA: NEGATIVE mg/dL
Ketones, POC UA: NEGATIVE mg/dL
Leukocytes, UA: NEGATIVE
Nitrite, UA: NEGATIVE
Specific Gravity, Urine: 1.01
Urobilinogen, Ur: 0.2
pH, UA: 6 (ref 5.0–8.0)

## 2023-10-22 MED ORDER — METHOCARBAMOL 750 MG PO TABS: 750.0000 mg | ORAL_TABLET | Freq: Three times a day (TID) | ORAL | 1 refills | Status: DC | PRN

## 2023-10-22 MED ORDER — PREDNISONE 20 MG PO TABS: 40.0000 mg | ORAL_TABLET | Freq: Every day | ORAL | 0 refills | Status: DC

## 2023-10-22 MED ORDER — METHYLPREDNISOLONE SODIUM SUCC 125 MG IJ SOLR: 125.0000 mg | Freq: Once | INTRAMUSCULAR | Status: DC

## 2023-10-22 MED ORDER — METHYLPREDNISOLONE SODIUM SUCC 125 MG IJ SOLR
125.0000 mg | Freq: Once | INTRAMUSCULAR | Status: AC
  Administered 2023-10-22: 125 mg via INTRAVENOUS

## 2023-10-22 NOTE — Progress Notes (Unsigned)
  [name], DNP, AGNP-c [facility] [address] [place], [place] [address] [phone]   ACUTE VISIT- ESTABLISHED PATIENT  There were no vitals taken for this visit.  Patient is due for the following: Colon Ca screen Shingles vax Flu vax Covid vax  Subjective:  HPI Sean Barnett is a 72 y.o. male presents to day for evaluation of acute concern(s).     ROS negative except for what is listed in HPI. History, Medications, Surgery, SDOH, and Family History reviewed and updated as appropriate.  Objective:  Physical Exam       Assessment & Plan:   Problem List Items Addressed This Visit   None     [name], DNP, AGNP-c

## 2023-10-22 NOTE — Patient Instructions (Addendum)
I have sent in an order for an x-ray for the low back to make sure that the spine is not out of alignment and causing the pain.  I sent this to [address] [facility]. Go over there and walk in to have this done.  I have sent in a medication called Robaxin which is a muscle relaxer to help with the spasms and the pain. You can take this in the day, but it may make you sleepy. Definitely take this at bedtime.   I have sent in prednisone for you to take in the mornings to help with the swelling. This you will start tomorrow.   I will be in touch with you about the x-ray and we will decide if we need to do anything else for the back or just allow rest and time.   On Monday IF YOU ARE FEELING BETTER you can start the exercises below. Don't lift anything heavy or do any strenuous work. I would like you to rest as much as possible over the weekend.  You can continue to use heat for about 20 minutes at a time several times a day to help with the pain, as well.   Lidocaine patches can be very helpful. These are less expensive over the counter at the pharmacy.   Low Back Sprain or Strain Rehab Ask your health care provider which exercises are safe for you. Do exercises exactly as told by your health care provider and adjust them as directed. It is normal to feel mild stretching, pulling, tightness, or discomfort as you do these exercises. Stop right away if you feel sudden pain or your pain gets worse. Do not begin these exercises until told by your health care provider. Stretching and range-of-motion exercises These exercises warm up your muscles and joints and improve the movement and flexibility of your back. These exercises also help to relieve pain, numbness, and tingling. Lumbar rotation  Lie on your back on a firm bed or the floor with your knees bent. Straighten your arms out to your sides so each arm forms a 90-degree angle (right angle) with a side of your body. Slowly move (rotate) both of  your knees to one side of your body until you feel a stretch in your lower back (lumbar). Try not to let your shoulders lift off the floor. Hold this position for ______10____ seconds. Tense your abdominal muscles and slowly move your knees back to the starting position. Repeat this exercise on the other side of your body. Repeat ____2______ times. Complete this exercise ______2____ times a day. Single knee to chest  Lie on your back on a firm bed or the floor with both legs straight. Bend one of your knees. Use your hands to move your knee up toward your chest until you feel a gentle stretch in your lower back and buttock. Hold your leg in this position by holding on to the front of your knee. Keep your other leg as straight as possible. Hold this position for _____10______ seconds. Slowly return to the starting position. Repeat with your other leg. Repeat _____2_____ times. Complete this exercise _____2_____ times a day. Prone extension on elbows  Lie on your abdomen on a firm bed or the floor (prone position). Prop yourself up on your elbows. Use your arms to help lift your chest up until you feel a gentle stretch in your abdomen and your lower back. This will place some of your body weight on your elbows. If this  is uncomfortable, try stacking pillows under your chest. Your hips should stay down, against the surface that you are lying on. Keep your hip and back muscles relaxed. Hold this position for ____10_______ seconds. Slowly relax your upper body and return to the starting position. Repeat ____2______ times. Complete this exercise _____2_____ times a day. Strengthening exercises These exercises build strength and endurance in your back. Endurance is the ability to use your muscles for a long time, even after they get tired. Pelvic tilt This exercise strengthens the muscles that lie deep in the abdomen. Lie on your back on a firm bed or the floor with your legs extended. Bend  your knees so they are pointing toward the ceiling and your feet are flat on the floor. Tighten your lower abdominal muscles to press your lower back against the floor. This motion will tilt your pelvis so your tailbone points up toward the ceiling instead of pointing to your feet or the floor. To help with this exercise, you may place a small towel under your lower back and try to push your back into the towel. Hold this position for ___10________ seconds. Let your muscles relax completely before you repeat this exercise. Repeat ____2______ times. Complete this exercise ____2______ times a day. Alternating arm and leg raises  Get on your hands and knees on a firm surface. If you are on a hard floor, you may want to use padding, such as an exercise mat, to cushion your knees. Line up your arms and legs. Your hands should be directly below your shoulders, and your knees should be directly below your hips. Lift your left leg behind you. At the same time, raise your right arm and straighten it in front of you. Do not lift your leg higher than your hip. Do not lift your arm higher than your shoulder. Keep your abdominal and back muscles tight. Keep your hips facing the ground. Do not arch your back. Keep your balance carefully, and do not hold your breath. Hold this position for ___10________ seconds. Slowly return to the starting position. Repeat with your right leg and your left arm. Repeat _____2_____ times. Complete this exercise _____2_____ times a day. Abdominal set with straight leg raise  Lie on your back on a firm bed or the floor. Bend one of your knees and keep your other leg straight. Tense your abdominal muscles and lift your straight leg up, 4-6 inches (10-15 cm) off the ground. Keep your abdominal muscles tight and hold this position for ____10_______ seconds. Do not hold your breath. Do not arch your back. Keep it flat against the ground. Keep your abdominal muscles tense as  you slowly lower your leg back to the starting position. Repeat with your other leg. Repeat ____2______ times. Complete this exercise ___2_______ times a day. Single leg lower with bent knees Lie on your back on a firm bed or the floor. Tense your abdominal muscles and lift your feet off the floor, one foot at a time, so your knees and hips are bent in 90-degree angles (right angles). Your knees should be over your hips and your lower legs should be parallel to the floor. Keeping your abdominal muscles tense and your knee bent, slowly lower one of your legs so your toe touches the ground. Lift your leg back up to return to the starting position. Do not hold your breath. Do not let your back arch. Keep your back flat against the ground. Repeat with your other leg. Repeat ______2____  times. Complete this exercise _____2_____ times a day. Posture and body mechanics Good posture and healthy body mechanics can help to relieve stress in your body's tissues and joints. Body mechanics refers to the movements and positions of your body while you do your daily activities. Posture is part of body mechanics. Good posture means: Your spine is in its natural S-curve position (neutral). Your shoulders are pulled back slightly. Your head is not tipped forward (neutral). Follow these guidelines to improve your posture and body mechanics in your everyday activities. Standing  When standing, keep your spine neutral and your feet about hip-width apart. Keep a slight bend in your knees. Your ears, shoulders, and hips should line up. When you do a task in which you stand in one place for a long time, place one foot up on a stable object that is 2-4 inches (5-10 cm) high, such as a footstool. This helps keep your spine neutral. Sitting  When sitting, keep your spine neutral and keep your feet flat on the floor. Use a footrest, if necessary, and keep your thighs parallel to the floor. Avoid rounding your shoulders,  and avoid tilting your head forward. When working at a desk or a computer, keep your desk at a height where your hands are slightly lower than your elbows. Slide your chair under your desk so you are close enough to maintain good posture. When working at a computer, place your monitor at a height where you are looking straight ahead and you do not have to tilt your head forward or downward to look at the screen. Resting When lying down and resting, avoid positions that are most painful for you. If you have pain with activities such as sitting, bending, stooping, or squatting, lie in a position in which your body does not bend very much. For example, avoid curling up on your side with your arms and knees near your chest (fetal position). If you have pain with activities such as standing for a long time or reaching with your arms, lie with your spine in a neutral position and bend your knees slightly. Try the following positions: Lying on your side with a pillow between your knees. Lying on your back with a pillow under your knees. Lifting  When lifting objects, keep your feet at least shoulder-width apart and tighten your abdominal muscles. Bend your knees and hips and keep your spine neutral. It is important to lift using the strength of your legs, not your back. Do not lock your knees straight out. Always ask for help to lift heavy or awkward objects. This information is not intended to replace advice given to you by your health care provider. Make sure you discuss any questions you have with your health care provider. Document Revised: 03/22/2023 Document Reviewed: 02/03/2021 Elsevier Patient Education  2024 [employer].

## 2023-10-25 NOTE — Assessment & Plan Note (Signed)
Chronic left-sided back pain exacerbated by recent fall and activities. Pain radiates occasionally to the right side and shoulder, with spasms causing significant discomfort, especially when lying down. No numbness or tingling in legs or feet. Likely muscular in origin, possibly involving the spine. Differential includes muscle strain and nerve impingement. Risks of proposed treatments include potential side effects of steroids and muscle relaxants, such as drowsiness and increased risk of infection. Benefits include reduced inflammation and muscle spasms, leading to pain relief. Alternatives include physical therapy and other non-steroidal anti-inflammatory medications, which are contraindicated due to concurrent use of Eliquis. - Order x-ray of the lumbar spine - Administer steroid injection today - Prescribe oral prednisone starting tomorrow - Prescribe Robaxin for muscle spasms - Advise use of heating pad - Provide instructions for gentle stretching exercises - Advise to avoid positions and activities that exacerbate pain - Follow up if symptoms persist or worsen

## 2023-10-25 NOTE — Progress Notes (Signed)
PCP: [name], NP Cardiology: Dr. Shirlee Latch  72 y.o. with history of HTN and smoking was recently diagnosed with atrial fibrillation and chronic systolic CHF.  Patient was admitted in 4/20 with left elbow septic bursitis.  Several weeks prior to the admission, he had been feeling palpitations and had been tiring more easily.  At the time of admission, he reported chest pressure.  He was found to be in atrial fibrillation with RVR.  Echo was done, showing EF depressed to 30-35%.  He was started on Coreg and it was titrated up to control his HR.  He was started on Eliquis.  He was not cardioverted.   In 10/20, he had LHC/RHC showing moderate nonobstructive CAD, elevated PCWP, and CI 1.85.  He was started on digoxin.  Three weeks later after restarting apixaban he had had successful DCCV to NSR later in 10/20.   He had recurrent atrial fibrillation and failed DCCV in 12/20.  He then had atrial fibrillation ablation in 1/21.  He is now off amiodarone.   Cardiac MRI (1/21) with LV EF 47%, RV EF 46%, basal inferolateral and apical lateral LGE, looks like coronary disease pattern.   Echo in 9/21 showed EF improved to 55-60%, normal RV.  Echo 5/23 showed EF 55% with normal RV, mild biatrial enlargement.      Echo 8/24 EF 60-65%, normal RV  Today he returns for HF follow up. Overall feeling fine. Breathing is "heavy" today, had a recent fall and has a pinched nerve in back, taking ibuprofen 800 mg every 4-5 hours. PCP gave him steroid taper and Robaxin. He attributes his dyspnea to pain. He is not SOB walking on flat ground. Denies CP, dizziness, edema, or PND/Orthopnea. Appetite ok. No fever or chills. Weight at home 229 pounds. Taking all medications. Did not tolerate Farxiga 2/2 to generalized itching. Smoking 1 ppd. Did not try Chantix. He works as a Surveyor, minerals, has been Under stress with sick brother living with him.  ECG (personally reviewed): none ordered today.  Labs (5/20): LDL 65 Labs (8/20):  K 4.5, creatinine 1.1, hgb 13.1 Labs (11/20): K 4, creatinine 1.08, hgb 15, digoxin 0.7 Labs (12/20): LDL 56 Labs (1/21): K 4.3, creatinine 1.2 Labs (2/21): K 4.4, creatinine 1.19 Labs (7/21): K 4.9, creatinine 1.69 Labs (10/21): K 4.6, creatinine 1.69 Labs (4/22): K 4.7, creatinine 1.47 Labs (10/22): K 4.3, creatinine 1.49 Labs (5/23): K 4.3, creatinine 1.34 Labs (8/23): LDL 46, HDL 32 Labs (6/24): K 4.9, creatinine 1.5, hgb 12.5 Labs (8/24): K 4.1, creatinine 1.42, LDL 41  PMH: 1. HTN 2. Atrial fibrillation: Paroxysmal. - DCCV to NSR in 10/20.  - Atrial fibrillation ablation 1/21.  3. Chronic systolic CHF: Nonischemic cardiomyopathy.  echo (4/20) with EF 30-35%, mildly decreased RV systolic function.  - LHC/RHC (10/20): 60% mid LAD, 40% PLOM, 50% dRCA; mean RA 2, PA 45/20, mean PCWP 22, CI 1.85, PVR 2.3 WU.  - Cardiac MRI (1/21): LV EF 47%, RV EF 46%, basal inferolateral and apical lateral LGE, looks like coronary disease pattern.  - Echo (9/21): EF 55-60% with normal RV.  - Echo (5/23): EF 55% with normal RV, mild biatrial enlargement.  4. H/o septic bursitis 5. CAD: LHC in 10/20 with moderate nonobstructive disease (see above).  6. CKD stage 3  SH: Works as Music therapist, widowed, smokes 1 ppd. Lives in [place].   FH: Father with lung cancer.  Brother with CABG in his 37s.   ROS: All systems reviewed and negative except  as per HPI.   Current Outpatient Medications  Medication Sig Dispense Refill   carvedilol (COREG) 6.25 MG tablet TAKE 1 TABLET BY MOUTH TWICE A DAY WITH FOOD 180 tablet 3   ELIQUIS 5 MG TABS tablet TAKE 1 TABLET BY MOUTH TWICE A DAY 180 tablet 3   ENTRESTO 24-26 MG TAKE 1 TABLET BY MOUTH TWICE A DAY 60 tablet 11   ibuprofen (ADVIL) 200 MG tablet Take 800 mg by mouth every 6 (six) hours as needed.     methocarbamol (ROBAXIN-750) 750 MG tablet Take 1 tablet (750 mg total) by mouth every 8 (eight) hours as needed for muscle spasms. 60 tablet 1   rosuvastatin  (CRESTOR) 10 MG tablet TAKE 1 TABLET BY MOUTH EVERY DAY 90 tablet 2   No current facility-administered medications for this encounter.   Wt Readings from Last 3 Encounters:  11/01/23 106.8 kg (235 lb 6.4 oz)  10/22/23 107.1 kg (236 lb 3.2 oz)  07/02/23 107.8 kg (237 lb 9.6 oz)   BP 128/76   Pulse (!) 54   Wt 106.8 kg (235 lb 6.4 oz)   SpO2 100%   BMI 30.22 kg/m  Physical Exam General:  NAD. No resp difficulty, walked into clinic HEENT: Normal Neck: Supple. No JVD. Carotids 2+ bilat; no bruits. No lymphadenopathy or thryomegaly appreciated. Cor: PMI nondisplaced. Regular rate & rhythm. No rubs, gallops or murmurs. Lungs: Clear Abdomen: Soft, nontender, nondistended. No hepatosplenomegaly. No bruits or masses. Good bowel sounds. Extremities: No cyanosis, clubbing, rash, edema Neuro: Alert & oriented x 3, cranial nerves grossly intact. Moves all 4 extremities w/o difficulty. Affect pleasant.  Assessment/Plan: 1. Chronic systolic CHF: Echo in 4/20 with EF 30-35%, mildly decreased RV systolic function.  Nonischemic cardiomyopathy, RHC/LHC in 10/20 showed nonobstructive CAD and low output with CI 1.85.  It is possible that this is a tachycardia-mediated cardiomyopathy with EF down because of atrial fibrillation.  It is also possible that the cardiomyopathy is from some other cause and that the cardiomyopathy itself predisposed him to atrial fibrillation.  Cardiac MRI in 1/21 showed some improvement, with LV EF 47% and normal RV, there was coronary pattern LGE in the inferolateral wall.  Echo in 9/21 showed EF up to 55-60% with normal RV, echo in 5/23 similarly showed EF 55% with normal RV, mild biatrial enlargement.  Echo 8/24 EF 60-65%, normal RV. NYHA class II, not volume overloaded on exam.  - Continue Coreg 6.25 mg bid, dose decreased in the past due to bradycardia.   - Continue Entresto 24/26 bid. BMET today. - He did not tolerate Farxiga 2/2 to itching.  - He is off spironolactone, with  improved EF, I think he can stay off (but would continue Entresto and Coreg).   2. Atrial fibrillation: Paroxysmal.  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  He had DCCV to NSR in 10/20 but afib recurred.  He had atrial fibrillation ablation in 1/21.  He is in sinus today. Amiodarone was stopped post-ablation.   - Continue Eliquis 5 mg bid.  3. Smoking: I strongly recommended that he quit.   - He has a prescription for Chantix, had teeth removed and is not ready to start yet.  4. HTN: BP controlled today. - Continue GDMT as above. 5. CAD: Moderate nonobstructive CAD on 10/20 cath. No chest pain. - No ASA given apixaban use.  - Continue Crestor 10 mg daily, good lipids 8/24. 6. Acute low back pain: Discussed limiting ibuprofen  use, and using acetaminophen. He has been referred to Neurosurgery. - Will given pantoprazole 40 mg daily for GI protection. Needs PCP follow up.   Follow up in 6 months with Dr. Shirlee Latch.  Anderson [facility] FNP-BC 11/01/2023

## 2023-10-27 ENCOUNTER — Other Ambulatory Visit: Payer: Self-pay

## 2023-10-27 DIAGNOSIS — M545 Low back pain, unspecified: Secondary | ICD-10-CM

## 2023-11-01 ENCOUNTER — Ambulatory Visit (HOSPITAL_COMMUNITY)
Admission: RE | Admit: 2023-11-01 | Discharge: 2023-11-01 | Disposition: A | Discharge status: Home / Self Care | Payer: 59 | Source: Ambulatory Visit | Attending: Family Medicine | Admitting: Family Medicine

## 2023-11-01 ENCOUNTER — Encounter (HOSPITAL_COMMUNITY): Payer: Self-pay

## 2023-11-01 ENCOUNTER — Telehealth (HOSPITAL_COMMUNITY): Payer: Self-pay | Admitting: Family Medicine

## 2023-11-01 ENCOUNTER — Telehealth: Payer: Self-pay | Admitting: Nurse Practitioner

## 2023-11-01 VITALS — BP 128/76 | HR 54 | Wt 235.4 lb

## 2023-11-01 DIAGNOSIS — N183 Chronic kidney disease, stage 3 unspecified: Secondary | ICD-10-CM | POA: Diagnosis not present

## 2023-11-01 DIAGNOSIS — I10 Essential (primary) hypertension: Secondary | ICD-10-CM

## 2023-11-01 DIAGNOSIS — I2510 Atherosclerotic heart disease of native coronary artery without angina pectoris: Secondary | ICD-10-CM | POA: Insufficient documentation

## 2023-11-01 DIAGNOSIS — Z7901 Long term (current) use of anticoagulants: Secondary | ICD-10-CM | POA: Diagnosis not present

## 2023-11-01 DIAGNOSIS — I480 Paroxysmal atrial fibrillation: Secondary | ICD-10-CM | POA: Insufficient documentation

## 2023-11-01 DIAGNOSIS — F17200 Nicotine dependence, unspecified, uncomplicated: Secondary | ICD-10-CM | POA: Diagnosis not present

## 2023-11-01 DIAGNOSIS — I130 Hypertensive heart and chronic kidney disease with heart failure and stage 1 through stage 4 chronic kidney disease, or unspecified chronic kidney disease: Secondary | ICD-10-CM | POA: Insufficient documentation

## 2023-11-01 DIAGNOSIS — I5022 Chronic systolic (congestive) heart failure: Secondary | ICD-10-CM | POA: Diagnosis not present

## 2023-11-01 DIAGNOSIS — I428 Other cardiomyopathies: Secondary | ICD-10-CM | POA: Insufficient documentation

## 2023-11-01 DIAGNOSIS — M545 Low back pain, unspecified: Secondary | ICD-10-CM | POA: Diagnosis not present

## 2023-11-01 DIAGNOSIS — Z79899 Other long term (current) drug therapy: Secondary | ICD-10-CM | POA: Diagnosis not present

## 2023-11-01 LAB — BASIC METABOLIC PANEL
Anion gap: 7 (ref 5–15)
BUN: 31 mg/dL — ABNORMAL HIGH (ref 8–23)
CO2: 22 mmol/L (ref 22–32)
Calcium: 8.6 mg/dL — ABNORMAL LOW (ref 8.9–10.3)
Chloride: 111 mmol/L (ref 98–111)
Creatinine, Ser: 2.07 mg/dL — ABNORMAL HIGH (ref 0.61–1.24)
GFR, Estimated: 33 mL/min — ABNORMAL LOW (ref >60)
Glucose, Bld: 103 mg/dL — ABNORMAL HIGH (ref 70–99)
Potassium: 4.5 mmol/L (ref 3.5–5.1)
Sodium: 140 mmol/L (ref 135–145)

## 2023-11-01 MED ORDER — PANTOPRAZOLE SODIUM 40 MG PO TBEC: 40.0000 mg | DELAYED_RELEASE_TABLET | Freq: Every day | ORAL | 0 refills | Status: DC

## 2023-11-01 NOTE — Telephone Encounter (Signed)
Pt states took steroid dose pack & Methocarbamol helps some at night, pt has been taking ibuprofen but heart doctor doesn't want him taking Ibuprofen with the blood thinner, saw heart doctor today.  Said pain worse at night, especially when he lays down, can't get comfortable to sleep, would like something else for pain to help him.  He is waiting on referral to [facility]

## 2023-11-01 NOTE — Patient Instructions (Addendum)
Thank you for coming in today  If you had labs drawn today, any labs that are abnormal the clinic will call you No news is good news  [facility] [phone]  Medications: Protonix 40 mg 1 tablet daily 30 day supply  Follow up appointments:  Your physician recommends that you schedule a follow-up appointment in:  6 months With Dr. Earlean Shawl will receive a reminder letter in the mail a few months in advance. If you don't receive a letter, please call our office to schedule the follow-up appointment.    Do the following things EVERYDAY: Weigh yourself in the morning before breakfast. Write it down and keep it in a log. Take your medicines as prescribed Eat low salt foods--Limit salt (sodium) to 2000 mg per day.  Stay as active as you can everyday Limit all fluids for the day to less than 2 liters   At the [facility], you and your health needs are our priority. As part of our continuing mission to provide you with exceptional heart care, we have created designated Provider Care Teams. These Care Teams include your primary Cardiologist (physician) and Advanced Practice Providers (APPs- Physician Assistants and Nurse Practitioners) who all work together to provide you with the care you need, when you need it.   You may see any of the following providers on your designated Care Team at your next follow up: Dr Arvilla Meres Dr Marca Ancona Dr. Marcos Eke, NP Robbie Lis, [facility] [place], [place] Brynda Peon, NP Karle Plumber, PharmD   Please be sure to bring in all your medications bottles to every appointment.    Thank you for choosing [facility]  If you have any questions or concerns before your next appointment please send [place] a message through [place] or call our office at [phone].    TO LEAVE A MESSAGE FOR THE NURSE SELECT OPTION 2, PLEASE LEAVE A  MESSAGE INCLUDING: YOUR NAME DATE OF BIRTH CALL BACK NUMBER REASON FOR CALL**this is important as we prioritize the call backs  YOU WILL RECEIVE A CALL BACK THE SAME DAY AS LONG AS YOU CALL BEFORE 4:00 PM

## 2023-11-04 ENCOUNTER — Telehealth (HOSPITAL_COMMUNITY): Payer: Self-pay | Admitting: Cardiology

## 2023-11-04 DIAGNOSIS — I5022 Chronic systolic (congestive) heart failure: Secondary | ICD-10-CM

## 2023-11-04 NOTE — Telephone Encounter (Signed)
Pt aware Repeat labs 12/19

## 2023-11-04 NOTE — Telephone Encounter (Signed)
-----   Message from Jacklynn Ganong sent at 11/01/2023 12:18 PM EST ----- Renal function declining. Needs to STOP excessive ibuprofen use, and switch to acetaminophen. I am concerned the ibuprofen is compromising renal function  Hold Entresto x 3 days, then resume at home dose. Repeat BMET in 10-14 days.

## 2023-11-05 NOTE — Telephone Encounter (Signed)
Tried calling pt again, left message

## 2023-11-10 DIAGNOSIS — M7918 Myalgia, other site: Secondary | ICD-10-CM | POA: Diagnosis not present

## 2023-11-18 ENCOUNTER — Ambulatory Visit (HOSPITAL_COMMUNITY)
Admission: RE | Admit: 2023-11-18 | Discharge: 2023-11-18 | Disposition: A | Discharge status: Home / Self Care | Payer: 59 | Source: Ambulatory Visit | Attending: Cardiology | Admitting: Cardiology

## 2023-11-18 DIAGNOSIS — I5022 Chronic systolic (congestive) heart failure: Secondary | ICD-10-CM | POA: Diagnosis not present

## 2023-11-18 LAB — BASIC METABOLIC PANEL
Anion gap: 7 (ref 5–15)
BUN: 15 mg/dL (ref 8–23)
CO2: 24 mmol/L (ref 22–32)
Calcium: 8.9 mg/dL (ref 8.9–10.3)
Chloride: 106 mmol/L (ref 98–111)
Creatinine, Ser: 1.38 mg/dL — ABNORMAL HIGH (ref 0.61–1.24)
GFR, Estimated: 54 mL/min — ABNORMAL LOW (ref >60)
Glucose, Bld: 101 mg/dL — ABNORMAL HIGH (ref 70–99)
Potassium: 4.1 mmol/L (ref 3.5–5.1)
Sodium: 137 mmol/L (ref 135–145)

## 2023-11-25 ENCOUNTER — Other Ambulatory Visit (HOSPITAL_COMMUNITY): Payer: Self-pay | Admitting: Family Medicine

## 2023-12-08 ENCOUNTER — Encounter: Payer: Self-pay | Admitting: Physical Therapy

## 2023-12-08 ENCOUNTER — Ambulatory Visit: Payer: 59 | Attending: Neurosurgery | Admitting: Physical Therapy

## 2023-12-08 DIAGNOSIS — M6281 Muscle weakness (generalized): Secondary | ICD-10-CM | POA: Diagnosis not present

## 2023-12-08 DIAGNOSIS — M5459 Other low back pain: Secondary | ICD-10-CM | POA: Diagnosis not present

## 2023-12-08 DIAGNOSIS — R2689 Other abnormalities of gait and mobility: Secondary | ICD-10-CM | POA: Insufficient documentation

## 2023-12-08 NOTE — Therapy (Signed)
 OUTPATIENT PHYSICAL THERAPY THORACOLUMBAR EVALUATION   Patient Name: Sean Barnett MRN: [id number] DOB:11-29-1951, 73 y.o., male Today's Date: 12/08/2023  END OF SESSION:  PT End of Session - 12/08/23 1107     Visit Number 1    Number of Visits 9    Date for PT Re-Evaluation 01/07/24    Authorization Type [employer] MEDICARE    PT Start Time 1105    PT Stop Time 1145    PT Time Calculation (min) 40 min    Activity Tolerance Patient tolerated treatment well;Patient limited by pain    Behavior During Therapy [facility] for tasks assessed/performed             Past Medical History:  Diagnosis Date   Bursitis of elbow 03/2019   left elbow   CHF (congestive heart failure) (HCC)    DDD (degenerative disc disease), lumbar    Dyspnea 03/30/2019   Insomnia 03/30/2019   Nonischemic cardiomyopathy (HCC)    Nonobstructive CAD    Persistent atrial fibrillation (HCC)    Septic bursitis of elbow, left 04/13/2019   Past Surgical History:  Procedure Laterality Date   ATRIAL FIBRILLATION ABLATION N/A 12/07/2019   Procedure: ATRIAL FIBRILLATION ABLATION;  Surgeon: Kelsie Agent, MD;  Location: [facility] INVASIVE CV LAB;  Service: Cardiovascular;  Laterality: N/A;   CARDIOVERSION N/A 09/27/2019   Procedure: CARDIOVERSION;  Surgeon: Rolan Ezra RAMAN, MD;  Location: [facility] ENDOSCOPY;  Service: Cardiovascular;  Laterality: N/A;   CARDIOVERSION N/A 11/30/2019   Procedure: CARDIOVERSION;  Surgeon: Rolan Ezra RAMAN, MD;  Location: [facility] ENDOSCOPY;  Service: Cardiovascular;  Laterality: N/A;   DENTAL EXAMINATION UNDER ANESTHESIA     RIGHT/LEFT HEART CATH AND CORONARY ANGIOGRAPHY N/A 09/06/2019   Procedure: RIGHT/LEFT HEART CATH AND CORONARY ANGIOGRAPHY;  Surgeon: Rolan Ezra RAMAN, MD;  Location: [facility] INVASIVE CV LAB;  Service: Cardiovascular;  Laterality: N/A;   Patient Active Problem List   Diagnosis Date Noted   Acute left-sided low back pain without sciatica 10/22/2023   Encounter for annual physical exam  06/28/2023   Encounter for annual wellness visit (AWV) in Medicare patient 06/28/2023   Smoking greater than 40 pack years 06/28/2023   Acquired thrombophilia (HCC) 06/28/2023   History of elevated glucose 06/28/2023   Rotator cuff arthropathy 06/28/2023   Poor dentition 06/28/2023   Knee pain 07/27/2022   Normocytic anemia 07/27/2022   Prediabetes 07/08/2021   Foot deformity, right 03/25/2020   Lower extremity numbness 03/25/2020   Persistent atrial fibrillation (HCC) 12/20/2019   Secondary hypercoagulable state (HCC) 12/20/2019   Chronic systolic heart failure (HCC) 09/06/2019   Healthcare maintenance 07/11/2019   Tobacco use 07/11/2019   Hypertension 04/13/2019   Nonischemic cardiomyopathy (HCC) 03/31/2019    PCP: Oris Camie BRAVO, NP   REFERRING PROVIDER: Lanis Pupa, MD  REFERRING DIAG: M79.18 (ICD-10-CM) - Lumbar muscle pain  Rationale for Evaluation and Treatment: Rehabilitation  THERAPY DIAG:  Other low back pain  Muscle weakness (generalized)  Other abnormalities of gait and mobility  ONSET DATE: 11/12/2023  SUBJECTIVE:  SUBJECTIVE STATEMENT: Was helping someone with a deck and was standing on something he shouldn't be standing on. Clemens backwards and hit the ground. This happened mid November. Reports it was on the L side and as soon as he fell, he knew it was a pinched nerve. Since that happened, the L side pain has gone away. Every once in a while when he was laying, he feels a little twinge. Now his biggest problem is on the R side. This started between Thanksgiving and December. Thinks that this happened by using the heating pad on the R side. Icy hot made it feel worse. Feels more of a cramp on this side other than the L side. It doesn't seem to want to go away. Most pain was  laying down flat on his back in bed. Felt like a vibrating cord. Then it would ease off and he could get comfortable. Notes that still happens, but not as much pain. Has the least amount of pain around 1-2 PM. Biggest thing is when he goes to get out of bed and stand up that is when he has pain. In office chair, putting gentle pressure on R side and leaning back seems to put it at ease. Reports ibuprofen helps his back, but unable to take it because he is on Eliquis . When he saw his PCP on 10/22/23, got a steroid shot and that helped his acute pain. Pt can't stand for 2-3 minutes until back starts to hurt.   PERTINENT HISTORY:  PMH: CHF, DDD, persistent a fib, HTN  Per PCP on 10/22/23: The pain was initially noticed after lying prone on the bed for extended periods while doing puzzles, and has since become more intense and persistent. The patient describes the pain as originating from the left side of the back, occasionally radiating across the back and up to the shoulder.   The patient reports a fall from a height of approximately 10-12 inches a week ago, which resulted in a spasm in the back and a subsequent increase in pain intensity. The patient also reports a history of a disc problem in the spine, but notes that the current pain is different and located off to the side.  PAIN:  Are you having pain? Yes: NPRS scale: 4-5 Pain location: R lower back (about 2 hands up from waist) Pain description: Soreness Aggravating factors: laying down, standing up  Relieving factors: putting gentle pressure on R side and leaning back seems to put it at ease  PRECAUTIONS: Fall  RED FLAGS: None   WEIGHT BEARING RESTRICTIONS: No  FALLS:  Has patient fallen in last 6 months? Yes. 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   PLOF: Independent  PATIENT GOALS: Wants to get rid of the pain, or at least be able to do a few things   NEXT MD  VISIT: Reports no MD follow-up   OBJECTIVE:  Note: Objective measures were completed at Evaluation unless otherwise noted.  DIAGNOSTIC FINDINGS:  Lumbar spine X-ray 10/22/23:  IMPRESSION: Moderate progressive degenerative changes of the lumbar spine diffusely.   There is moderate compression T12, not seen in 2017 but in principle is age-indeterminate. Please correlate for any known history or dedicated workup to confirm age and etiology when clinically appropriate with cross-sectional imaging.  PATIENT SURVEYS:  Modified Oswestry 28/50 = Severe disability    COGNITION: Overall cognitive status: Within functional limits for tasks assessed  SENSATION: Pt denies numbness/tingling in legs   MUSCLE LENGTH: Hamstrings: Right Approx. 40-45 degrees, L Approx. 60 degrees   POSTURE: rounded shoulders, posterior pelvic tilt, and weight shift left Sits with legs crossed   PALPATION: TTP R lumbar paraspinals   LUMBAR ROM:   AROM eval  Flexion Can touch toes, but needs to bend knees  Extension 50% limited  Right lateral flexion 50% limited, needs to bend knee  Left lateral flexion 25% limited, needs to bend knee   Right rotation [facility], feels a stretch  Left rotation WFL, feels a stretch   (Blank rows = not tested)  LOWER EXTREMITY ROM:     Passive  Right eval Left eval  Hip flexion Pt reporting extreme pain at 90 degrees  WFL  Hip extension    Hip abduction    Hip adduction    Hip internal rotation Limited WFL  Hip external rotation [facility]  Knee flexion    Knee extension    Ankle dorsiflexion    Ankle plantarflexion    Ankle inversion    Ankle eversion     (Blank rows = not tested)  LOWER EXTREMITY MMT:    MMT Right eval Left eval  Hip flexion 3+ (could feel strain on his back) 4+  Hip extension    Hip abduction    Hip adduction    Hip internal rotation    Hip external rotation    Knee flexion 4+ 5  Knee extension 4+ 3-  Ankle dorsiflexion 5 5  Ankle  plantarflexion    Ankle inversion    Ankle eversion     (Blank rows = not tested)  LUMBAR SPECIAL TESTS:  Straight leg raise test: negative bilaterally, pt just reporting tightness in hamstrings  Slump test: Negative bilaterally, pt just reporting tightness in hamstrings     GAIT: Distance walked: Clinic distances  Assistive device utilized: None Level of assistance: Modified independence Comments: Slowed gait speed, antalgic due to pain.   Pt with incr pain with bed mobility with sit <> supine, rolling, takes incr time due to low back pain   TREATMENT DATE: N/A during eval                                                                                                                                 PATIENT EDUCATION:  Education details: Clinical findings, POC.  Person educated: Patient Education method: Explanation Education comprehension: verbalized understanding  HOME EXERCISE PROGRAM: Will provide at future session.   ASSESSMENT:  CLINICAL IMPRESSION: Patient is a 73 year old male referred to Neuro OPPT for low back pain (worse on R than L).   Pt's PMH is significant for: CHF, DDD, persistent a fib, HTN. The following deficits were present during the exam: decr activity tolerance, gait abnormalities, decr strength, decr ROM, TTP to R lumbar paraspinals, difficulty with transfers/bed mobility due to pain, decr standing tolerance. Based on modified Oswestry, pt  with severe disability in regards to low back pain. Pt would benefit from skilled PT to address these impairments and functional limitations to maximize functional mobility independence and decr back pain.    OBJECTIVE IMPAIRMENTS: Abnormal gait, decreased activity tolerance, decreased mobility, difficulty walking, decreased ROM, decreased strength, hypomobility, increased muscle spasms, impaired flexibility, postural dysfunction, and pain.   ACTIVITY LIMITATIONS: carrying, lifting, bending, standing, squatting,  sleeping, stairs, transfers, bed mobility, and locomotion level  PARTICIPATION LIMITATIONS: community activity, occupation, and yard work  PERSONAL FACTORS: Age, Behavior pattern, Past/current experiences, Time since onset of injury/illness/exacerbation, and 3+ comorbidities: CHF, DDD, persistent a fib, HTN  are also affecting patient's functional outcome.   REHAB POTENTIAL: Good  CLINICAL DECISION MAKING: Stable/uncomplicated  EVALUATION COMPLEXITY: Low   GOALS: Goals reviewed with patient? Yes  SHORT TERM GOALS: ALL STGS = LTGS   LONG TERM GOALS: Target date: 01/05/2024  Pt will be independent with final HEP for ROM/strengthening in order to build upon functional gains made in therapy. Baseline: No HEP  Goal status: INITIAL  2.  Pt will report low back pain to 2-3 or less in order to demo decr pain during daily life.  Baseline: 4-5/10  Goal status: INITIAL  3.  Pt will report being able to stand for at least 15-20 minutes in order to demo improved tolerance for ADLs.  Baseline:  Goal status: INITIAL  4.  Pt will decr modified ODI to a 22/50 or less in order to demo decr disability with low back pain.  Baseline: 28/50 Goal status: INITIAL   PLAN:  PT FREQUENCY: 2x/week  PT DURATION: 4 weeks  PLANNED INTERVENTIONS: [id number]- PT Re-evaluation, 97110-Therapeutic exercises, 97530- Therapeutic activity, 97112- Neuromuscular re-education, 97535- Self Care, [id number]- Manual therapy, [phone]- Gait training, Patient/Family education, Dry Needling, Joint mobilization, Spinal mobilization, Cryotherapy, and Moist heat.  PLAN FOR NEXT SESSION: Initiate gentle HEP for stretching/low back ROM. Educate on log roll technique to decr stress on low back. Try dry needling in future session?    [place] LOISE Senate, PT, DPT 12/08/2023, 1:57 PM

## 2023-12-15 ENCOUNTER — Ambulatory Visit: Payer: 59 | Admitting: Physical Therapy

## 2023-12-15 ENCOUNTER — Encounter: Payer: Self-pay | Admitting: Physical Therapy

## 2023-12-15 DIAGNOSIS — R2689 Other abnormalities of gait and mobility: Secondary | ICD-10-CM | POA: Diagnosis not present

## 2023-12-15 DIAGNOSIS — M6281 Muscle weakness (generalized): Secondary | ICD-10-CM | POA: Diagnosis not present

## 2023-12-15 DIAGNOSIS — M5459 Other low back pain: Secondary | ICD-10-CM | POA: Diagnosis not present

## 2023-12-15 NOTE — Therapy (Signed)
 OUTPATIENT PHYSICAL THERAPY THORACOLUMBAR TREATMENT   Patient Name: Sean Barnett MRN: [id number] DOB:Jun 09, 1951, 73 y.o., male Today's Date: 12/15/2023  END OF SESSION:  PT End of Session - 12/15/23 1319     Visit Number 2    Number of Visits 9    Date for PT Re-Evaluation 01/07/24    Authorization Type [employer]ICARE    PT Start Time 1318    PT Stop Time 1358    PT Time Calculation (min) 40 min    Activity Tolerance Patient limited by pain    Behavior During Therapy [facility] for tasks assessed/performed             Past Medical History:  Diagnosis Date   Bursitis of elbow 03/2019   left elbow   CHF (congestive heart failure) (HCC)    DDD (degenerative disc disease), lumbar    Dyspnea 03/30/2019   Insomnia 03/30/2019   Nonischemic cardiomyopathy (HCC)    Nonobstructive CAD    Persistent atrial fibrillation (HCC)    Septic bursitis of elbow, left 04/13/2019   Past Surgical History:  Procedure Laterality Date   ATRIAL FIBRILLATION ABLATION N/A 12/07/2019   Procedure: ATRIAL FIBRILLATION ABLATION;  Surgeon: Jolly Needle, MD;  Location: [facility] INVASIVE CV LAB;  Service: Cardiovascular;  Laterality: N/A;   CARDIOVERSION N/A 09/27/2019   Procedure: CARDIOVERSION;  Surgeon: Darlis Eisenmenger, MD;  Location: [facility] ENDOSCOPY;  Service: Cardiovascular;  Laterality: N/A;   CARDIOVERSION N/A 11/30/2019   Procedure: CARDIOVERSION;  Surgeon: Darlis Eisenmenger, MD;  Location: [facility] ENDOSCOPY;  Service: Cardiovascular;  Laterality: N/A;   DENTAL EXAMINATION UNDER ANESTHESIA     RIGHT/LEFT HEART CATH AND CORONARY ANGIOGRAPHY N/A 09/06/2019   Procedure: RIGHT/LEFT HEART CATH AND CORONARY ANGIOGRAPHY;  Surgeon: Darlis Eisenmenger, MD;  Location: [facility] INVASIVE CV LAB;  Service: Cardiovascular;  Laterality: N/A;   Patient Active Problem List   Diagnosis Date Noted   Acute left-sided low back pain without sciatica 10/22/2023   Encounter for annual physical exam 06/28/2023   Encounter for annual  wellness visit (AWV) in Medicare patient 06/28/2023   Smoking greater than 40 pack years 06/28/2023   Acquired thrombophilia (HCC) 06/28/2023   History of elevated glucose 06/28/2023   Rotator cuff arthropathy 06/28/2023   Poor dentition 06/28/2023   Knee pain 07/27/2022   Normocytic anemia 07/27/2022   Prediabetes 07/08/2021   Foot deformity, right 03/25/2020   Lower extremity numbness 03/25/2020   Persistent atrial fibrillation (HCC) 12/20/2019   Secondary hypercoagulable state (HCC) 12/20/2019   Chronic systolic heart failure (HCC) 09/06/2019   Healthcare maintenance 07/11/2019   Tobacco use 07/11/2019   Hypertension 04/13/2019   Nonischemic cardiomyopathy (HCC) 03/31/2019    PCP: Annella Kief, NP   REFERRING PROVIDER: Augusto Blonder, MD  REFERRING DIAG: M79.18 (ICD-10-CM) - Lumbar muscle pain  Rationale for Evaluation and Treatment: Rehabilitation  THERAPY DIAG:  Other low back pain  Muscle weakness (generalized)  Other abnormalities of gait and mobility  ONSET DATE: 11/12/2023  SUBJECTIVE:  SUBJECTIVE STATEMENT: Pt reports he did laundry the other day and it felt like his whole back was a big cramp. Notes back pain is still more on the R side, but it feels like it is spreading. The more he is standing, the more the pain is spreading. By the time he walks from one spot to the other in his house, he feels exhausted. Reports was given exercises by his PCP, but has not done any of them.   PERTINENT HISTORY:  PMH: CHF, DDD, persistent a fib, HTN  Per PCP on 10/22/23: The pain was initially noticed after lying prone on the bed for extended periods while doing puzzles, and has since become more intense and persistent. The patient describes the pain as originating from the left side of  the back, occasionally radiating across the back and up to the shoulder.   The patient reports a fall from a height of approximately 10-12 inches a week ago, which resulted in a spasm in the back and a subsequent increase in pain intensity. The patient also reports a history of a disc problem in the spine, but notes that the current pain is different and located off to the side.  PAIN:  Are you having pain? Yes: NPRS scale: 4 Pain location: R lower back (about 2 hands up from waist) Pain description: Soreness Aggravating factors: laying down, standing up  Relieving factors: putting gentle pressure on R side and leaning back seems to put it at ease  PRECAUTIONS: Fall  RED FLAGS: None   WEIGHT BEARING RESTRICTIONS: No  FALLS:  Has patient fallen in last 6 months? Yes. 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   PLOF: Independent  PATIENT GOALS: Wants to get rid of the pain, or at least be able to do a few things   NEXT MD VISIT: Reports no MD follow-up   OBJECTIVE:  Note: Objective measures were completed at Evaluation unless otherwise noted.  DIAGNOSTIC FINDINGS:  Lumbar spine X-ray 10/22/23:  IMPRESSION: Moderate progressive degenerative changes of the lumbar spine diffusely.   There is moderate compression T12, not seen in 2017 but in principle is age-indeterminate. Please correlate for any known history or dedicated workup to confirm age and etiology when clinically appropriate with cross-sectional imaging.  PATIENT SURVEYS:  Modified Oswestry 28/50 = Severe disability    COGNITION: Overall cognitive status: Within functional limits for tasks assessed     SENSATION: Pt denies numbness/tingling in legs   MUSCLE LENGTH: Hamstrings: Right Approx. 40-45 degrees, L Approx. 60 degrees   POSTURE: rounded shoulders, posterior pelvic tilt, and weight shift left Sits with legs crossed   PALPATION: TTP  R lumbar paraspinals   LUMBAR ROM:   AROM eval  Flexion Can touch toes, but needs to bend knees  Extension 50% limited  Right lateral flexion 50% limited, needs to bend knee  Left lateral flexion 25% limited, needs to bend knee   Right rotation [facility], feels a stretch  Left rotation WFL, feels a stretch   (Blank rows = not tested)  LOWER EXTREMITY ROM:     Passive  Right eval Left eval  Hip flexion Pt reporting extreme pain at 90 degrees  WFL  Hip extension    Hip abduction    Hip adduction    Hip internal rotation Limited WFL  Hip external rotation [facility]  Knee flexion    Knee extension  Ankle dorsiflexion    Ankle plantarflexion    Ankle inversion    Ankle eversion     (Blank rows = not tested)  LOWER EXTREMITY MMT:    MMT Right eval Left eval  Hip flexion 3+ (could feel strain on his back) 4+  Hip extension    Hip abduction    Hip adduction    Hip internal rotation    Hip external rotation    Knee flexion 4+ 5  Knee extension 4+ 3-  Ankle dorsiflexion 5 5  Ankle plantarflexion    Ankle inversion    Ankle eversion     (Blank rows = not tested)  LUMBAR SPECIAL TESTS:  Straight leg raise test: negative bilaterally, pt just reporting tightness in hamstrings  Slump test: Negative bilaterally, pt just reporting tightness in hamstrings   GAIT: Distance walked: Clinic distances  Assistive device utilized: None Level of assistance: Modified independence Comments: Slowed gait speed, antalgic due to pain.    TREATMENT DATE:  Pt with incr pain with bed mobility with sit <> supine, rolling, takes incr time due to low back pain   Therapeutic Exercise:        Pt takes incr time to get into supine position due to pain. Needs to lay on his back for 2-3 minutes with cues from therapist for breathing due to incr pain   Lower trunk rotations, 10 reps gently, pt moving slowly due to pain.  Single knee to chest stretch 2 x 30-45 seconds each side, bringing it  slightly to opposite shoulder Supine figure 4 piriformis stretch 2 x 45 seconds each side           Educated on log roll technique to help decr strain on low back when getting out of bed Seated hamstring stretch 2 x 30 seconds          Blue physioball roll-outs into lumbar flexion, pt unable to tolerate due to pain, performed 3-4 reps  Attempted seated pelvic tilts, performed ~8 reps, pt moving more from upper back compared to pelvis despite demo cues                                                                                           PATIENT EDUCATION:  Education details: Initial HEP for low back pain  Person educated: Patient Education method: Programmer, multimedia, Facilities manager, Verbal cues, and Handouts Education comprehension: verbalized understanding, returned demonstration, and needs further education  HOME EXERCISE PROGRAM: Access Code: [email or web address] URL: [email or web address] Date: 12/15/2023 Prepared by: Jonathan Neighbor  Exercises - Lower Trunk Rotations  - 1-2 x daily - 7 x weekly - 1 sets - 10 reps - Supine Single Knee to Chest Stretch  - 1-2 x daily - 7 x weekly - 3 sets - 30 hold - Supine Figure 4 Piriformis Stretch  - 1-2 x daily - 7 x weekly - 3 sets - 30 hold - Seated Hamstring Stretch  - 1-2 x daily - 7 x weekly - 3 sets - 30 hold  ASSESSMENT:  CLINICAL IMPRESSION: Today's skilled session focused on initiating HEP for R>L low back pain. Pt is more limited with  mobility with R low back. Pt limited during session due to incr pain, takes incr time to get into positions. Pt reporting 6.5-7/10 pain at end of session after attempting gentle seated pelvic tilts, however pt able to tolerate exercises on HEP well. Will continue per POC.    OBJECTIVE IMPAIRMENTS: Abnormal gait, decreased activity tolerance, decreased mobility, difficulty walking, decreased ROM, decreased strength, hypomobility, increased muscle spasms, impaired flexibility, postural dysfunction, and  pain.   ACTIVITY LIMITATIONS: carrying, lifting, bending, standing, squatting, sleeping, stairs, transfers, bed mobility, and locomotion level  PARTICIPATION LIMITATIONS: community activity, occupation, and yard work  PERSONAL FACTORS: Age, Behavior pattern, Past/current experiences, Time since onset of injury/illness/exacerbation, and 3+ comorbidities: CHF, DDD, persistent a fib, HTN  are also affecting patient's functional outcome.   REHAB POTENTIAL: Good  CLINICAL DECISION MAKING: Stable/uncomplicated  EVALUATION COMPLEXITY: Low   GOALS: Goals reviewed with patient? Yes  SHORT TERM GOALS: ALL STGS = LTGS   LONG TERM GOALS: Target date: 01/05/2024  Pt will be independent with final HEP for ROM/strengthening in order to build upon functional gains made in therapy. Baseline: No HEP  Goal status: INITIAL  2.  Pt will report low back pain to 2-3 or less in order to demo decr pain during daily life.  Baseline: 4-5/10  Goal status: INITIAL  3.  Pt will report being able to stand for at least 15-20 minutes in order to demo improved tolerance for ADLs.  Baseline:  Goal status: INITIAL  4.  Pt will decr modified ODI to a 22/50 or less in order to demo decr disability with low back pain.  Baseline: 28/50 Goal status: INITIAL   PLAN:  PT FREQUENCY: 2x/week  PT DURATION: 4 weeks  PLANNED INTERVENTIONS: [id number]- PT Re-evaluation, 97110-Therapeutic exercises, 97530- Therapeutic activity, 97112- Neuromuscular re-education, 97535- Self Care, [id number]- Manual therapy, [phone]- Gait training, Patient/Family education, Dry Needling, Joint mobilization, Spinal mobilization, Cryotherapy, and Moist heat.  PLAN FOR NEXT SESSION:add to gentle HEP as needed for stretching/low back ROM. Work on gentle ROM/flexibility and stretching to low back. Gentle core exercises. Try dry needling in future session?    Se[place]ron [place], PT, DPT 12/15/2023, 2:10 PM

## 2023-12-17 ENCOUNTER — Ambulatory Visit: Payer: 59 | Admitting: Physical Therapy

## 2023-12-17 DIAGNOSIS — R2689 Other abnormalities of gait and mobility: Secondary | ICD-10-CM | POA: Diagnosis not present

## 2023-12-17 DIAGNOSIS — M6281 Muscle weakness (generalized): Secondary | ICD-10-CM | POA: Diagnosis not present

## 2023-12-17 DIAGNOSIS — M5459 Other low back pain: Secondary | ICD-10-CM | POA: Diagnosis not present

## 2023-12-17 NOTE — Therapy (Signed)
OUTPATIENT PHYSICAL THERAPY THORACOLUMBAR TREATMENT   Patient Name: Sean Barnett MRN: [id number] DOB:08-09-1951, 73 y.o., male Today's Date: 12/17/2023  END OF SESSION:  PT End of Session - 12/17/23 1015     Visit Number 3    Number of Visits 9    Date for PT Re-Evaluation 01/07/24    Authorization Type [employer]ICARE    PT Start Time 1014    PT Stop Time 1058    PT Time Calculation (min) 44 min    Activity Tolerance Patient limited by pain    Behavior During Therapy [facility] for tasks assessed/performed              Past Medical History:  Diagnosis Date   Bursitis of elbow 03/2019   left elbow   CHF (congestive heart failure) (HCC)    DDD (degenerative disc disease), lumbar    Dyspnea 03/30/2019   Insomnia 03/30/2019   Nonischemic cardiomyopathy (HCC)    Nonobstructive CAD    Persistent atrial fibrillation (HCC)    Septic bursitis of elbow, left 04/13/2019   Past Surgical History:  Procedure Laterality Date   ATRIAL FIBRILLATION ABLATION N/A 12/07/2019   Procedure: ATRIAL FIBRILLATION ABLATION;  Surgeon: Hillis Range, MD;  Location: [facility] INVASIVE CV LAB;  Service: Cardiovascular;  Laterality: N/A;   CARDIOVERSION N/A 09/27/2019   Procedure: CARDIOVERSION;  Surgeon: Laurey Morale, MD;  Location: [facility] ENDOSCOPY;  Service: Cardiovascular;  Laterality: N/A;   CARDIOVERSION N/A 11/30/2019   Procedure: CARDIOVERSION;  Surgeon: Laurey Morale, MD;  Location: [facility] ENDOSCOPY;  Service: Cardiovascular;  Laterality: N/A;   DENTAL EXAMINATION UNDER ANESTHESIA     RIGHT/LEFT HEART CATH AND CORONARY ANGIOGRAPHY N/A 09/06/2019   Procedure: RIGHT/LEFT HEART CATH AND CORONARY ANGIOGRAPHY;  Surgeon: Laurey Morale, MD;  Location: [facility] INVASIVE CV LAB;  Service: Cardiovascular;  Laterality: N/A;   Patient Active Problem List   Diagnosis Date Noted   Acute left-sided low back pain without sciatica 10/22/2023   Encounter for annual physical exam 06/28/2023   Encounter for  annual wellness visit (AWV) in Medicare patient 06/28/2023   Smoking greater than 40 pack years 06/28/2023   Acquired thrombophilia (HCC) 06/28/2023   History of elevated glucose 06/28/2023   Rotator cuff arthropathy 06/28/2023   Poor dentition 06/28/2023   Knee pain 07/27/2022   Normocytic anemia 07/27/2022   Prediabetes 07/08/2021   Foot deformity, right 03/25/2020   Lower extremity numbness 03/25/2020   Persistent atrial fibrillation (HCC) 12/20/2019   Secondary hypercoagulable state (HCC) 12/20/2019   Chronic systolic heart failure (HCC) 09/06/2019   Healthcare maintenance 07/11/2019   Tobacco use 07/11/2019   Hypertension 04/13/2019   Nonischemic cardiomyopathy (HCC) 03/31/2019    PCP: [name], NP   REFERRING PROVIDER: Lisbeth Renshaw, MD  REFERRING DIAG: M79.18 (ICD-10-CM) - Lumbar muscle pain  Rationale for Evaluation and Treatment: Rehabilitation  THERAPY DIAG:  Other low back pain  Muscle weakness (generalized)  Other abnormalities of gait and mobility  ONSET DATE: 11/12/2023  SUBJECTIVE:  SUBJECTIVE STATEMENT:   "Bill"  Pt reports that his pain is about the same as it has been, 5-6/10  Pt recounts the history of his injury, he fell back on the ground which resulted in a pinched nerve on his L side, that seems to be healing. Pt reports that now on his back R side about two hands above his waist it feels like it is cramping. Pt reports having the most pain if sitting unsupported or standing/walking, feels better leaning back against a chair. Pt reports that he cannot walk further than from PT gym to his car outside.  Pt has been doing his HEP, does it until it "hurts", has not noticed much of a difference in his symptoms yet with the stretches.    PERTINENT HISTORY:   PMH: CHF, DDD, persistent a fib, HTN  Per PCP on 10/22/23: The pain was initially noticed after lying prone on the bed for extended periods while doing puzzles, and has since become more intense and persistent. The patient describes the pain as originating from the left side of the back, occasionally radiating across the back and up to the shoulder.   The patient reports a fall from a height of approximately 10-12 inches a week ago, which resulted in a spasm in the back and a subsequent increase in pain intensity. The patient also reports a history of a disc problem in the spine, but notes that the current pain is different and located off to the side.  PAIN:  Are you having pain? Yes: NPRS scale: 5-6 Pain location: R lower back (about 2 hands up from waist) Pain description: Soreness Aggravating factors: laying down, standing up  Relieving factors: putting gentle pressure on R side and leaning back seems to put it at ease  PRECAUTIONS: Fall  RED FLAGS: None   WEIGHT BEARING RESTRICTIONS: No  FALLS:  Has patient fallen in last 6 months? Yes. 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   PLOF: Independent  PATIENT GOALS: Wants to get rid of the pain, or at least be able to do a few things   NEXT MD VISIT: Reports no MD follow-up   OBJECTIVE:  Note: Objective measures were completed at Evaluation unless otherwise noted.  DIAGNOSTIC FINDINGS:  Lumbar spine X-ray 10/22/23:  IMPRESSION: Moderate progressive degenerative changes of the lumbar spine diffusely.   There is moderate compression T12, not seen in 2017 but in principle is age-indeterminate. Please correlate for any known history or dedicated workup to confirm age and etiology when clinically appropriate with cross-sectional imaging.  PATIENT SURVEYS:  Modified Oswestry 28/50 = Severe disability    COGNITION: Overall cognitive status: Within  functional limits for tasks assessed     SENSATION: Pt denies numbness/tingling in legs   MUSCLE LENGTH: Hamstrings: Right Approx. 40-45 degrees, L Approx. 60 degrees   POSTURE: rounded shoulders, posterior pelvic tilt, and weight shift left Sits with legs crossed   PALPATION: TTP R lumbar paraspinals   LUMBAR ROM:   AROM eval  Flexion Can touch toes, but needs to bend knees  Extension 50% limited  Right lateral flexion 50% limited, needs to bend knee  Left lateral flexion 25% limited, needs to bend knee   Right rotation [facility], feels a stretch  Left rotation WFL, feels a stretch   (Blank rows = not tested)  LOWER EXTREMITY ROM:     Passive  Right eval Left  eval  Hip flexion Pt reporting extreme pain at 90 degrees  WFL  Hip extension    Hip abduction    Hip adduction    Hip internal rotation Limited WFL  Hip external rotation [facility]  Knee flexion    Knee extension    Ankle dorsiflexion    Ankle plantarflexion    Ankle inversion    Ankle eversion     (Blank rows = not tested)  LOWER EXTREMITY MMT:    MMT Right eval Left eval  Hip flexion 3+ (could feel strain on his back) 4+  Hip extension    Hip abduction    Hip adduction    Hip internal rotation    Hip external rotation    Knee flexion 4+ 5  Knee extension 4+ 3-  Ankle dorsiflexion 5 5  Ankle plantarflexion    Ankle inversion    Ankle eversion     (Blank rows = not tested)  LUMBAR SPECIAL TESTS:  Straight leg raise test: negative bilaterally, pt just reporting tightness in hamstrings  Slump test: Negative bilaterally, pt just reporting tightness in hamstrings   GAIT: Distance walked: Clinic distances  Assistive device utilized: None Level of assistance: Modified independence Comments: Slowed gait speed, antalgic due to pain.    TREATMENT DATE:   Therapeutic Exercise:        Trial of various stretches to address ongoing pain and muscle tightness following TPDN: Seated blue Swiss ball  anterior leans 5 x 15 sec each Seated R QL stretch 4 x 30 sec each Sidelying R QL stretch 4 x 30 sec each Does start to have onset of R shoulder pain due to hx of torn RTC                                                                                    TherAct Palpation of lower back musculature in standing, noted to have significant muscle tightness in R lumbar and thoracic paraspinals as compared to L side. Also performed palpations in L sidelying and noted same as indicated previously.  Trigger Point Dry Needling  Initial Treatment: Pt instructed on Dry Needling rational, procedures, and possible side effects. Pt instructed to expect mild to moderate muscle soreness later in the day and/or into the next day.  Pt instructed in methods to reduce muscle soreness. Pt instructed to continue prescribed HEP. Because Dry Needling was performed over or adjacent to a lung field, pt was educated on S/S of pneumothorax and to seek immediate medical attention should they occur.  Patient was educated on signs and symptoms of infection and other risk factors and advised to seek medical attention should they occur.  Patient verbalized understanding of these instructions and education.   Patient Verbal Consent Given: Yes Education Handout Provided: Yes Muscles Treated: R thoracic and lumbar multifidus and iliocostalis Electrical Stimulation Performed: No Treatment Response/Outcome: deep ache/pressure; decreased size of trigger points    Trigger Point Dry Needling  What is Trigger Point Dry Needling (DN)? DN is a physical therapy technique used to treat muscle pain and dysfunction. Specifically, DN helps deactivate muscle trigger points (muscle knots).  A thin filiform needle is used  to penetrate the skin and stimulate the underlying trigger point. The goal is for a local twitch response (LTR) to occur and for the trigger point to relax. No medication of any kind is injected during the procedure.    What Does Trigger Point Dry Needling Feel Like?  The procedure feels different for each individual patient. Some patients report that they do not actually feel the needle enter the skin and overall the process is not painful. Very mild bleeding may occur. However, many patients feel a deep cramping in the muscle in which the needle was inserted. This is the local twitch response.   How Will I feel after the treatment? Soreness is normal, and the onset of soreness may not occur for a few hours. Typically this soreness does not last longer than two days.  Bruising is uncommon, however; ice can be used to decrease any possible bruising.  In rare cases feeling tired or nauseous after the treatment is normal. In addition, your symptoms may get worse before they get better, this period will typically not last longer than 24 hours.   What Can I do After My Treatment? Increase your hydration by drinking more water for the next 24 hours.  You may place ice or heat on the areas treated that have become sore, however, do not use heat on inflamed or bruised areas. Heat often brings more relief post needling. You can continue your regular activities, but vigorous activity is not recommended initially after the treatment for 24 hours. DN is best combined with other physical therapy such as strengthening, stretching, and other therapies.   What are the complications? While your therapist has had extensive training in minimizing the risks of trigger point dry needling, it is important to understand the risks of any procedure.  Risks include bleeding, pain, fatigue, hematoma, infection, vertigo, nausea or nerve involvement. Monitor for any changes to your skin or sensation. Contact your therapist or MD with concerns.  A rare but serious complication is a pneumothorax over or near your middle and upper chest and back If you have dry needling in this area, monitor for the following symptoms: Shortness of breath  on exertion and/or Difficulty taking a deep breath and/or Chest Pain and/or A dry cough If any of the above symptoms develop, please go to the nearest emergency room or call 911. Tell them you had dry needling over your thorax and report any symptoms you are having. Please follow-up with your treating therapist after you complete the medical evaluation.   PATIENT EDUCATION:  Education details: TPDN (see above), continue HEP and added to HEP Person educated: Patient Education method: Explanation, Demonstration, Verbal cues, and Handouts Education comprehension: verbalized understanding, returned demonstration, and needs further education  HOME EXERCISE PROGRAM: Access Code: [email or web address] URL: [email or web address] Date: 12/15/2023 Prepared by: Sherlie Ban  Exercises - Lower Trunk Rotations  - 1-2 x daily - 7 x weekly - 1 sets - 10 reps - Supine Single Knee to Chest Stretch  - 1-2 x daily - 7 x weekly - 3 sets - 30 hold - Supine Figure 4 Piriformis Stretch  - 1-2 x daily - 7 x weekly - 3 sets - 30 hold - Seated Hamstring Stretch  - 1-2 x daily - 7 x weekly - 3 sets - 30 hold - Seated Quadratus Lumborum Stretch in Chair  - 1-2 x daily - 7 x weekly - 1 sets - 3 reps - 30 sec hold   ASSESSMENT:  CLINICAL IMPRESSION: Emphasis  of skilled PT session on initiating TPDN to address muscle pain and tightness in R thoracic and lumbar paraspinal region followed by stretching of muscles. Pt tolerates TPDN this date, can better assess response next session. Pt with fair tolerance for stretching, added seated QL stretch to HEP as he has the least difficulty performing this. Pt continues to benefit from skilled PT services to address ongoing muscle pain and tightness and increase his independence with management of his pain symptoms. Continue POC.    OBJECTIVE IMPAIRMENTS: Abnormal gait, decreased activity tolerance, decreased mobility, difficulty walking, decreased ROM, decreased  strength, hypomobility, increased muscle spasms, impaired flexibility, postural dysfunction, and pain.   ACTIVITY LIMITATIONS: carrying, lifting, bending, standing, squatting, sleeping, stairs, transfers, bed mobility, and locomotion level  PARTICIPATION LIMITATIONS: community activity, occupation, and yard work  PERSONAL FACTORS: Age, Behavior pattern, Past/current experiences, Time since onset of injury/illness/exacerbation, and 3+ comorbidities: CHF, DDD, persistent a fib, HTN  are also affecting patient's functional outcome.   REHAB POTENTIAL: Good  CLINICAL DECISION MAKING: Stable/uncomplicated  EVALUATION COMPLEXITY: Low   GOALS: Goals reviewed with patient? Yes  SHORT TERM GOALS: ALL STGS = LTGS   LONG TERM GOALS: Target date: 01/05/2024  Pt will be independent with final HEP for ROM/strengthening in order to build upon functional gains made in therapy. Baseline: No HEP  Goal status: INITIAL  2.  Pt will report low back pain to 2-3 or less in order to demo decr pain during daily life.  Baseline: 4-5/10  Goal status: INITIAL  3.  Pt will report being able to stand for at least 15-20 minutes in order to demo improved tolerance for ADLs.  Baseline:  Goal status: INITIAL  4.  Pt will decr modified ODI to a 22/50 or less in order to demo decr disability with low back pain.  Baseline: 28/50 Goal status: INITIAL   PLAN:  PT FREQUENCY: 2x/week  PT DURATION: 4 weeks  PLANNED INTERVENTIONS: [id number]- PT Re-evaluation, 97110-Therapeutic exercises, 97530- Therapeutic activity, 97112- Neuromuscular re-education, 97535- Self Care, [id number]- Manual therapy, [phone]- Gait training, Patient/Family education, Dry Needling, Joint mobilization, Spinal mobilization, Cryotherapy, and Moist heat.  PLAN FOR NEXT SESSION: add to gentle HEP as needed for stretching/low back ROM. Work on gentle ROM/flexibility and stretching to low back. Gentle core exercises. Response to DN? windmills? Thread the  needle at countertop, standing QL stretch, theracane or tennis balls?   Peter Congo, PT Peter Congo, PT, DPT, CSRS  12/17/2023, 11:04 AM

## 2023-12-22 ENCOUNTER — Ambulatory Visit: Payer: 59 | Admitting: Physical Therapy

## 2023-12-22 ENCOUNTER — Encounter: Payer: Self-pay | Admitting: Physical Therapy

## 2023-12-22 VITALS — BP 104/69 | HR 57

## 2023-12-22 DIAGNOSIS — M5459 Other low back pain: Secondary | ICD-10-CM | POA: Diagnosis not present

## 2023-12-22 DIAGNOSIS — R2689 Other abnormalities of gait and mobility: Secondary | ICD-10-CM | POA: Diagnosis not present

## 2023-12-22 DIAGNOSIS — M6281 Muscle weakness (generalized): Secondary | ICD-10-CM

## 2023-12-22 NOTE — Therapy (Signed)
OUTPATIENT PHYSICAL THERAPY THORACOLUMBAR TREATMENT   Patient Name: Sean [facility]ett MRN: [id number] DOB:1951-01-20, 73 y.o., male Today's Date: 12/22/2023  END OF SESSION:  PT End of Session - 12/22/23 1200     Visit Number 4    Number of Visits 9    Date for PT Re-Evaluation 01/07/24    Authorization Type [employer] MEDICARE    PT Start Time 1147    PT Stop Time 1228    PT Time Calculation (min) 41 min    Activity Tolerance Patient limited by pain    Behavior During Therapy [facility] for tasks assessed/performed              Past Medical History:  Diagnosis Date   Bursitis of elbow 03/2019   left elbow   CHF (congestive heart failure) (HCC)    DDD (degenerative disc disease), lumbar    Dyspnea 03/30/2019   Insomnia 03/30/2019   Nonischemic cardiomyopathy (HCC)    Nonobstructive CAD    Persistent atrial fibrillation (HCC)    Septic bursitis of elbow, left 04/13/2019   Past Surgical History:  Procedure Laterality Date   ATRIAL FIBRILLATION ABLATION N/A 12/07/2019   Procedure: ATRIAL FIBRILLATION ABLATION;  Surgeon: Hillis Range, MD;  Location: [facility] INVASIVE CV LAB;  Service: Cardiovascular;  Laterality: N/A;   CARDIOVERSION N/A 09/27/2019   Procedure: CARDIOVERSION;  Surgeon: Laurey Morale, MD;  Location: [facility] ENDOSCOPY;  Service: Cardiovascular;  Laterality: N/A;   CARDIOVERSION N/A 11/30/2019   Procedure: CARDIOVERSION;  Surgeon: Laurey Morale, MD;  Location: [facility] ENDOSCOPY;  Service: Cardiovascular;  Laterality: N/A;   DENTAL EXAMINATION UNDER ANESTHESIA     RIGHT/LEFT HEART CATH AND CORONARY ANGIOGRAPHY N/A 09/06/2019   Procedure: RIGHT/LEFT HEART CATH AND CORONARY ANGIOGRAPHY;  Surgeon: Laurey Morale, MD;  Location: [facility] INVASIVE CV LAB;  Service: Cardiovascular;  Laterality: N/A;   Patient Active Problem List   Diagnosis Date Noted   Acute left-sided low back pain without sciatica 10/22/2023   Encounter for annual physical exam 06/28/2023   Encounter for  annual wellness visit (AWV) in Medicare patient 06/28/2023   Smoking greater than 40 pack years 06/28/2023   Acquired thrombophilia (HCC) 06/28/2023   History of elevated glucose 06/28/2023   Rotator cuff arthropathy 06/28/2023   Poor dentition 06/28/2023   Knee pain 07/27/2022   Normocytic anemia 07/27/2022   Prediabetes 07/08/2021   Foot deformity, right 03/25/2020   Lower extremity numbness 03/25/2020   Persistent atrial fibrillation (HCC) 12/20/2019   Secondary hypercoagulable state (HCC) 12/20/2019   Chronic systolic heart failure (HCC) 09/06/2019   Healthcare maintenance 07/11/2019   Tobacco use 07/11/2019   Hypertension 04/13/2019   Nonischemic cardiomyopathy (HCC) 03/31/2019    PCP: [name], NP   REFERRING PROVIDER: Lisbeth Renshaw, MD  REFERRING DIAG: M79.18 (ICD-10-CM) - Lumbar muscle pain  Rationale for Evaluation and Treatment: Rehabilitation  THERAPY DIAG:  Other low back pain  Muscle weakness (generalized)  Other abnormalities of gait and mobility  ONSET DATE: 11/12/2023  SUBJECTIVE:  SUBJECTIVE STATEMENT:   "Bill"  Pt states the pain has centralized some since TPDN.  He reports that his BP has been low because his pain has decreased his appetite and he has lost 15 lbs since November.  He denies other concerning red flag symptoms, but states he fell yesterday because his BP was 70s/40s.  It has since improved because he is trying to force himself to eat.  He denies injury from fall as he fell forward and caught himself.  He recounts some of medical history for PT.     PERTINENT HISTORY:  PMH: CHF, DDD, persistent a fib, HTN  Per PCP on 10/22/23: The pain was initially noticed after lying prone on the bed for extended periods while doing puzzles, and has since  become more intense and persistent. The patient describes the pain as originating from the left side of the back, occasionally radiating across the back and up to the shoulder.   The patient reports a fall from a height of approximately 10-12 inches a week ago, which resulted in a spasm in the back and a subsequent increase in pain intensity. The patient also reports a history of a disc problem in the spine, but notes that the current pain is different and located off to the side.  PAIN:  Are you having pain? Yes: NPRS scale: 3 Pain location: R lower back (about 2 hands up from waist) Pain description: Soreness Aggravating factors: laying down, standing up  Relieving factors: putting gentle pressure on R side and leaning back seems to put it at ease  PRECAUTIONS: Fall  RED FLAGS: None   WEIGHT BEARING RESTRICTIONS: No  FALLS:  Has patient fallen in last 6 months? Yes. 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   PLOF: Independent  PATIENT GOALS: Wants to get rid of the pain, or at least be able to do a few things   NEXT MD VISIT: Reports no MD follow-up   OBJECTIVE:  Note: Objective measures were completed at Evaluation unless otherwise noted.  DIAGNOSTIC FINDINGS:  Lumbar spine X-ray 10/22/23:  IMPRESSION: Moderate progressive degenerative changes of the lumbar spine diffusely.   There is moderate compression T12, not seen in 2017 but in principle is age-indeterminate. Please correlate for any known history or dedicated workup to confirm age and etiology when clinically appropriate with cross-sectional imaging.  PATIENT SURVEYS:  Modified Oswestry 28/50 = Severe disability    COGNITION: Overall cognitive status: Within functional limits for tasks assessed     SENSATION: Pt denies numbness/tingling in legs   MUSCLE LENGTH: Hamstrings: Right Approx. 40-45 degrees, L Approx. 60 degrees   POSTURE:  rounded shoulders, posterior pelvic tilt, and weight shift left Sits with legs crossed   PALPATION: TTP R lumbar paraspinals   LUMBAR ROM:   AROM eval  Flexion Can touch toes, but needs to bend knees  Extension 50% limited  Right lateral flexion 50% limited, needs to bend knee  Left lateral flexion 25% limited, needs to bend knee   Right rotation [facility], feels a stretch  Left rotation WFL, feels a stretch   (Blank rows = not tested)  LOWER EXTREMITY ROM:     Passive  Right eval Left eval  Hip flexion Pt reporting extreme pain at 90 degrees  WFL  Hip extension    Hip abduction    Hip adduction    Hip internal rotation Limited [facility]  Hip external rotation [facility]  Knee flexion    Knee extension    Ankle dorsiflexion    Ankle plantarflexion    Ankle inversion    Ankle eversion     (Blank rows = not tested)  LOWER EXTREMITY MMT:    MMT Right eval Left eval  Hip flexion 3+ (could feel strain on his back) 4+  Hip extension    Hip abduction    Hip adduction    Hip internal rotation    Hip external rotation    Knee flexion 4+ 5  Knee extension 4+ 3-  Ankle dorsiflexion 5 5  Ankle plantarflexion    Ankle inversion    Ankle eversion     (Blank rows = not tested)  LUMBAR SPECIAL TESTS:  Straight leg raise test: negative bilaterally, pt just reporting tightness in hamstrings  Slump test: Negative bilaterally, pt just reporting tightness in hamstrings   GAIT: Distance walked: Clinic distances  Assistive device utilized: None Level of assistance: Modified independence Comments: Slowed gait speed, antalgic due to pain.    TREATMENT DATE:  12/22/2023 Therapeutic Exercise:        -Had pt assume hook-lying with increased time and coached through paced diaphragmatic breathing to promote posture relaxation into position prior to warmup -LTRs x20 for gentle warmup -Sahrmann progression level 2 - heel taps x10 each LE, cued for TrA activation -Left side-lying stretch x10  using deep breaths to pace task -Seated windmills 2x10 each side, cued for diaphragmatic breathing to prevent light-headedness    TherAct Assessed LUE BP prior to interventions: Vitals:   12/22/23 1152  BP: 104/69  Pulse: (!) 57   Discussed pt's loss of appetite and certain medications needing to be taken with food and to reach out to PCP for guidance about protein supplement if having glucose concerns or otherwise.  Provided education on orthostatic BP and slow transitions to limit fall risk - use ankle pumps, hand squeezing, deep breaths and hydration to assist with BP maintenance during transitions.  Patient educated on body healing process with limited nutrition and nutrition to supplement activity.  Discussed expected pain migration w/ modalities like TPDN - confirmed pt would like to try again in the future.  Discussed and confirmed pt is okay to change 1/31 appt to 1/30 at 115pm - provided written reminder for pt.  PATIENT EDUCATION:  Education details: Continue HEP.  Can try modalities like heat at home for pain relief. Person educated: Patient Education method: Explanation, Demonstration, Verbal cues, and Handouts Education comprehension: verbalized understanding, returned demonstration, and needs further education  HOME EXERCISE PROGRAM: Access Code: [email or web address] URL: [email or web address] Date: 12/15/2023 Prepared by: Sherlie Ban  Exercises - Lower Trunk Rotations  - 1-2 x daily - 7 x weekly - 1 sets - 10 reps - Supine Single Knee to Chest Stretch  - 1-2 x daily - 7 x weekly - 3 sets - 30 hold - Supine Figure 4 Piriformis Stretch  - 1-2 x daily - 7 x weekly - 3 sets - 30 hold - Seated Hamstring Stretch  - 1-2 x daily - 7 x weekly - 3 sets - 30 hold - Seated Quadratus Lumborum Stretch in Chair  - 1-2 x daily - 7 x weekly - 1 sets - 3 reps - 30 sec hold   ASSESSMENT:  CLINICAL IMPRESSION: Focus of skilled session on mat based core and low trunk  flexibility.  Pt has some pain in his low back with flexion based tasks, but  is better managed this visit compared to last.  He is interested in repeating dry needling at a future session and would likely benefit from rotational based activities and some form of global mobilizations to right flank for pain management.  Will continue per POC.    OBJECTIVE IMPAIRMENTS: Abnormal gait, decreased activity tolerance, decreased mobility, difficulty walking, decreased ROM, decreased strength, hypomobility, increased muscle spasms, impaired flexibility, postural dysfunction, and pain.   ACTIVITY LIMITATIONS: carrying, lifting, bending, standing, squatting, sleeping, stairs, transfers, bed mobility, and locomotion level  PARTICIPATION LIMITATIONS: community activity, occupation, and yard work  PERSONAL FACTORS: Age, Behavior pattern, Past/current experiences, Time since onset of injury/illness/exacerbation, and 3+ comorbidities: CHF, DDD, persistent a fib, HTN  are also affecting patient's functional outcome.   REHAB POTENTIAL: Good  CLINICAL DECISION MAKING: Stable/uncomplicated  EVALUATION COMPLEXITY: Low   GOALS: Goals reviewed with patient? Yes  SHORT TERM GOALS: ALL STGS = LTGS   LONG TERM GOALS: Target date: 01/05/2024  Pt will be independent with final HEP for ROM/strengthening in order to build upon functional gains made in therapy. Baseline: No HEP  Goal status: INITIAL  2.  Pt will report low back pain to 2-3 or less in order to demo decr pain during daily life.  Baseline: 4-5/10  Goal status: INITIAL  3.  Pt will report being able to stand for at least 15-20 minutes in order to demo improved tolerance for ADLs.  Baseline:  Goal status: INITIAL  4.  Pt will decr modified ODI to a 22/50 or less in order to demo decr disability with low back pain.  Baseline: 28/50 Goal status: INITIAL   PLAN:  PT FREQUENCY: 2x/week  PT DURATION: 4 weeks  PLANNED INTERVENTIONS: 97164- PT  Re-evaluation, 97110-Therapeutic exercises, 97530- Therapeutic activity, 97112- Neuromuscular re-education, 97535- Self Care, 91478- Manual therapy, [phone]- Gait training, Patient/Family education, Dry Needling, Joint mobilization, Spinal mobilization, Cryotherapy, and Moist heat.  PLAN FOR NEXT SESSION: add to gentle HEP as needed for stretching/low back ROM. Work on gentle ROM/flexibility and stretching to low back. Gentle core exercises. Repeat DN. Thread the needle at countertop, standing QL stretch, theracane or tennis balls?  General right sided rib mobs?  Standing chops and lat pulls to support rotation and low back   Sadie Haber, PT, DPT  12/22/2023, 1:05 PM

## 2023-12-24 ENCOUNTER — Encounter: Payer: Self-pay | Admitting: Physical Therapy

## 2023-12-24 ENCOUNTER — Ambulatory Visit: Payer: 59 | Admitting: Physical Therapy

## 2023-12-24 DIAGNOSIS — M6281 Muscle weakness (generalized): Secondary | ICD-10-CM

## 2023-12-24 DIAGNOSIS — M5459 Other low back pain: Secondary | ICD-10-CM | POA: Diagnosis not present

## 2023-12-24 DIAGNOSIS — R2689 Other abnormalities of gait and mobility: Secondary | ICD-10-CM | POA: Diagnosis not present

## 2023-12-24 NOTE — Therapy (Addendum)
OUTPATIENT PHYSICAL THERAPY THORACOLUMBAR TREATMENT   Patient Name: Sean Barnett MRN: [id number] DOB:[birth year], 73 y.o., male Today's Date: 12/24/2023  END OF SESSION:  PT End of Session - 12/24/23 1101     Visit Number 5    Number of Visits 9    Date for PT Re-Evaluation 01/07/24    Authorization Type [employer] MEDICARE    PT Start Time 1100    PT Stop Time 1142    PT Time Calculation (min) 42 min    Activity Tolerance Patient limited by pain    Behavior During Therapy Sean [facility]ett for tasks assessed/performed              Past Medical History:  Diagnosis Date   Bursitis of elbow 03/2019   left elbow   CHF (congestive heart failure) (HCC)    DDD (degenerative disc disease), lumbar    Dyspnea 03/30/2019   Insomnia 03/30/2019   Nonischemic cardiomyopathy (HCC)    Nonobstructive CAD    Persistent atrial fibrillation (HCC)    Septic bursitis of elbow, left 04/13/2019   Past Surgical History:  Procedure Laterality Date   ATRIAL FIBRILLATION ABLATION N/A 12/07/2019   Procedure: ATRIAL FIBRILLATION ABLATION;  Surgeon: Sean [facility];  Location: [facility];  Service: Cardiovascular;  Laterality: N/A;   CARDIOVERSION N/A 09/27/2019   Procedure: CARDIOVERSION;  Surgeon: Sean [facility];  [facility]: [facility];  Service: Cardiovascular;  Laterality: N/A;   CARDIOVERSION N/A 11/30/2019   Procedure: CARDIOVERSION;  Surgeon: Sean [facility];  [facility]: [facility];  Service: Cardiovascular;  Laterality: N/A;   DENTAL EXAMINATION UNDER ANESTHESIA     RIGHT/LEFT HEART CATH AND CORONARY ANGIOGRAPHY N/A 09/06/2019   Procedure: RIGHT/LEFT HEART CATH AND CORONARY ANGIOGRAPHY;  Surgeon: Sean [facility];  [facility];  Service: Cardiovascular;  Laterality: N/A;   Patient Active Problem List   Diagnosis Date Noted   Acute left-sided low back pain without sciatica 10/22/2023   Encounter for annual physical exam 06/28/2023   Encounter for  annual wellness visit (AWV) in Medicare patient 06/28/2023   Smoking greater than 40 pack years 06/28/2023   Acquired thrombophilia (HCC) 06/28/2023   History of elevated glucose 06/28/2023   Rotator cuff arthropathy 06/28/2023   Poor dentition 06/28/2023   Knee pain 07/27/2022   Normocytic anemia 07/27/2022   Prediabetes 07/08/2021   Foot deformity, right 03/25/2020   Lower extremity numbness 03/25/2020   Persistent atrial fibrillation (HCC) 12/20/2019   Secondary hypercoagulable state (HCC) 12/20/2019   Chronic systolic heart failure (HCC) 09/06/2019   Healthcare maintenance 07/11/2019   Tobacco use 07/11/2019   Hypertension 04/13/2019   Nonischemic cardiomyopathy (HCC) 03/31/2019    Sean [facility]: Sean Eth, NP   REFERRING PROVIDER: Lisbeth Renshaw, [facility]  REFERRING DIAG: M79.18 (ICD-10-CM) - Lumbar muscle pain  Rationale for Evaluation and Treatment: Rehabilitation  THERAPY DIAG:  Other low back pain  Muscle weakness (generalized)  Other abnormalities of gait and mobility  ONSET DATE: 11/12/2023  SUBJECTIVE:  SUBJECTIVE STATEMENT:  "Sean [facility]ett"  Reports that the L side isn't as quiet as it used to be. Just a little bit of pain at a 1-2/10. Reports the pain has been dormant. Biggest thing is standing for longer periods of time. Reports BP has been better at home.   PERTINENT HISTORY:  PMH: CHF, DDD, persistent a fib, HTN  Per Sean Barnett on 10/22/23: The pain was initially noticed after lying prone on the bed for extended periods while doing puzzles, and has since become more intense and persistent. The patient describes the pain as originating from the left side of the back, occasionally radiating across the back and up to the shoulder.   The patient reports a fall from a height of approximately 10-12  inches a week ago, which resulted in a spasm in the back and a subsequent increase in pain intensity. The patient also reports a history of a disc problem in the spine, but notes that the current pain is different and located off to the side.  PAIN:  Are you having pain? Yes: NPRS scale: 5 Pain location: R lower back (about 2 hands up from waist) Pain description: Soreness Aggravating factors: laying down, standing up, walking in from the parking lot  Relieving factors: putting gentle pressure on R side and leaning back seems to put it at ease  PRECAUTIONS: Fall  RED FLAGS: None   WEIGHT BEARING RESTRICTIONS: No  FALLS:  Has patient fallen in last 6 months? Yes. 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   PLOF: Independent  PATIENT GOALS: Wants to get rid of the pain, or at least be able to do a few things   NEXT Barnett VISIT: Reports no Barnett follow-up   OBJECTIVE:  Note: Objective measures were completed at Evaluation unless otherwise noted.  DIAGNOSTIC FINDINGS:  Lumbar spine X-ray 10/22/23:  IMPRESSION: Moderate progressive degenerative changes of the lumbar spine diffusely.   There is moderate compression T12, not seen in 2017 but in principle is age-indeterminate. Please correlate for any known history or dedicated workup to confirm age and etiology when clinically appropriate with cross-sectional imaging.  PATIENT SURVEYS:  Modified Oswestry 28/50 = Severe disability    COGNITION: Overall cognitive status: Within functional limits for tasks assessed     SENSATION: Pt denies numbness/tingling in legs   MUSCLE LENGTH: Hamstrings: Right Approx. 40-45 degrees, L Approx. 60 degrees   POSTURE: rounded shoulders, posterior pelvic tilt, and weight shift left Sits with legs crossed   PALPATION: TTP R lumbar paraspinals   LUMBAR ROM:   AROM eval  Flexion Can touch toes, but needs to bend knees   Extension 50% limited  Right lateral flexion 50% limited, needs to bend knee  Left lateral flexion 25% limited, needs to bend knee   Right rotation [facility], feels a stretch  Left rotation WFL, feels a stretch   (Blank rows = not tested)  LOWER EXTREMITY ROM:     Passive  Right eval Left eval  Hip flexion Pt reporting extreme pain at 90 degrees  WFL  Hip extension    Hip abduction    Hip adduction    Hip internal rotation Limited WFL  Hip external rotation [facility]  Knee flexion    Knee extension    Ankle dorsiflexion    Ankle plantarflexion    Ankle inversion    Ankle eversion     (Blank rows =  not tested)  LOWER EXTREMITY MMT:    MMT Right eval Left eval  Hip flexion 3+ (could feel strain on his back) 4+  Hip extension    Hip abduction    Hip adduction    Hip internal rotation    Hip external rotation    Knee flexion 4+ 5  Knee extension 4+ 3-  Ankle dorsiflexion 5 5  Ankle plantarflexion    Ankle inversion    Ankle eversion     (Blank rows = not tested)  LUMBAR SPECIAL TESTS:  Straight leg raise test: negative bilaterally, pt just reporting tightness in hamstrings  Slump test: Negative bilaterally, pt just reporting tightness in hamstrings   GAIT: Distance walked: Clinic distances  Assistive device utilized: None Level of assistance: Modified independence Comments: Slowed gait speed, antalgic due to pain.    TREATMENT DATE:  12/24/2023  Therapeutic Exercise:        SciFit with BUE/BLE for aerobic warm-up, ROM, and mobility at gear 2.0 for 6 minutes. Pt reports feeling easier to walk afterwards for a minute or two. No incr in back pain.  Seated spinal twist 3  x 30 seconds each side, cues for gentle AROM and breathing  Seated cat/cow, pt tried 2 reps and reported it incr his pain  Thread the kneedle at countertop 5 reps each side, with cues for inhales/exhales   Therapeutic Activity: Trigger Point Dry Needling  Subsequent Treatment: Instructions  provided previously at initial dry needling treatment.   Patient Verbal Consent Given: Yes Education Handout Provided: Previously Provided Muscles Treated: R thoracic multifidus and iliocostalis Electrical Stimulation Performed: No Treatment Response/Outcome: deep ache/pressure and muscle cramping; muscle twitch detected     PATIENT EDUCATION:  Education details: Continue HEP.  Can continue to try modalities like heat at home for pain relief. Making sure pt stays hydrated after dry needling again today. Seated gentle twist addition to HEP  Person educated: Patient Education method: Explanation, Demonstration, Verbal cues, and Handouts Education comprehension: verbalized understanding, returned demonstration, and needs further education  HOME EXERCISE PROGRAM: Access Code: [email or web address] URL: [email or web address] Date: 12/24/2023 Prepared by: Sherlie Ban  Exercises - Lower Trunk Rotations  - 1-2 x daily - 7 x weekly - 1 sets - 10 reps - Supine Single Knee to Chest Stretch  - 1-2 x daily - 7 x weekly - 3 sets - 30 hold - Supine Figure 4 Piriformis Stretch  - 1-2 x daily - 7 x weekly - 3 sets - 30 hold - Seated Hamstring Stretch  - 1-2 x daily - 7 x weekly - 3 sets - 30 hold - Seated Quadratus Lumborum Stretch in Chair  - 1-2 x daily - 7 x weekly - 1 sets - 3 reps - 30 sec hold - Seated Trunk Rotation Stretch  - 1-2 x daily - 7 x weekly - 3 sets - 30 hold   ASSESSMENT:  CLINICAL IMPRESSION: Started session with SciFit for aerobic warm-up and ROM. Pt reporting no pain on the SciFit and did report feeling better with walking after being on. Performed TPDN again during session to address muscle pain and tightness to pt's R thoracic/lumbar paraspinal region. Will further assess response at next session as pt tolerated well after the first time. Remainder of session focused on gentle trunk rotations in sitting and at countertop. Added seated gentle spinal twist to HEP as pt  reporting feeling a good stretch with this. Pt reporting pain at end of session at a 6/10 (pt started  at a 5/10).  Will continue per POC.    OBJECTIVE IMPAIRMENTS: Abnormal gait, decreased activity tolerance, decreased mobility, difficulty walking, decreased ROM, decreased strength, hypomobility, increased muscle spasms, impaired flexibility, postural dysfunction, and pain.   ACTIVITY LIMITATIONS: carrying, lifting, bending, standing, squatting, sleeping, stairs, transfers, bed mobility, and locomotion level  PARTICIPATION LIMITATIONS: community activity, occupation, and yard work  PERSONAL FACTORS: Age, Behavior pattern, Past/current experiences, Time since onset of injury/illness/exacerbation, and 3+ comorbidities: CHF, DDD, persistent a fib, HTN  are also affecting patient's functional outcome.   REHAB POTENTIAL: Good  CLINICAL DECISION MAKING: Stable/uncomplicated  EVALUATION COMPLEXITY: Low   GOALS: Goals reviewed with patient? Yes  SHORT TERM GOALS: ALL STGS = LTGS   LONG TERM GOALS: Target date: 01/05/2024  Pt will be independent with final HEP for ROM/strengthening in order to build upon functional gains made in therapy. Baseline: No HEP  Goal status: INITIAL  2.  Pt will report low back pain to 2-3 or less in order to demo decr pain during daily life.  Baseline: 4-5/10  Goal status: INITIAL  3.  Pt will report being able to stand for at least 15-20 minutes in order to demo improved tolerance for ADLs.  Baseline:  Goal status: INITIAL  4.  Pt will decr modified ODI to a 22/50 or less in order to demo decr disability with low back pain.  Baseline: 28/50 Goal status: INITIAL   PLAN:  PT FREQUENCY: 2x/week  PT DURATION: 4 weeks  PLANNED INTERVENTIONS: 97164- PT Re-evaluation, 97110-Therapeutic exercises, 97530- Therapeutic activity, 97112- Neuromuscular re-education, 97535- Self Care, 16109- Manual therapy, [phone]- Gait training, Patient/Family education, Dry  Needling, Joint mobilization, Spinal mobilization, Cryotherapy, and Moist heat.  PLAN FOR NEXT SESSION: add to gentle HEP as needed for stretching/low back ROM. Work on gentle ROM/flexibility and stretching to low back. Gentle core exercises. Repeat DN as needed. Thread the needle at countertop, standing QL stretch, theracane or tennis balls?  Standing chops and lat pulls to support rotation and low back   Drake Leach, PT, DPT Peter Congo, PT, DPT, CSRS   12/24/2023, 11:56 AM

## 2023-12-28 ENCOUNTER — Ambulatory Visit: Payer: 59 | Admitting: Physical Therapy

## 2023-12-28 DIAGNOSIS — M5459 Other low back pain: Secondary | ICD-10-CM

## 2023-12-28 DIAGNOSIS — R2689 Other abnormalities of gait and mobility: Secondary | ICD-10-CM | POA: Diagnosis not present

## 2023-12-28 DIAGNOSIS — M6281 Muscle weakness (generalized): Secondary | ICD-10-CM

## 2023-12-28 NOTE — Therapy (Signed)
OUTPATIENT PHYSICAL THERAPY THORACOLUMBAR TREATMENT   Patient Name: Kealii Thueson MRN: [id number] DOB:Oct 22, 1951, 73 y.o., male Today's Date: 12/28/2023  END OF SESSION:  PT End of Session - 12/28/23 1105     Visit Number 6    Number of Visits 9    Date for PT Re-Evaluation 01/07/24    Authorization Type [employer]ICARE    PT Start Time 1100    PT Stop Time 1130   ended early due to pain   PT Time Calculation (min) 30 min    Activity Tolerance Patient limited by pain    Behavior During Therapy [facility] for tasks assessed/performed               Past Medical History:  Diagnosis Date   Bursitis of elbow 03/2019   left elbow   CHF (congestive heart failure) (HCC)    DDD (degenerative disc disease), lumbar    Dyspnea 03/30/2019   Insomnia 03/30/2019   Nonischemic cardiomyopathy (HCC)    Nonobstructive CAD    Persistent atrial fibrillation (HCC)    Septic bursitis of elbow, left 04/13/2019   Past Surgical History:  Procedure Laterality Date   ATRIAL FIBRILLATION ABLATION N/A 12/07/2019   Procedure: ATRIAL FIBRILLATION ABLATION;  Surgeon: Hillis Range, MD;  Location: [facility] INVASIVE CV LAB;  Service: Cardiovascular;  Laterality: N/A;   CARDIOVERSION N/A 09/27/2019   Procedure: CARDIOVERSION;  Surgeon: Laurey Morale, MD;  Location: [facility] ENDOSCOPY;  Service: Cardiovascular;  Laterality: N/A;   CARDIOVERSION N/A 11/30/2019   Procedure: CARDIOVERSION;  Surgeon: Laurey Morale, MD;  Location: [facility] ENDOSCOPY;  Service: Cardiovascular;  Laterality: N/A;   DENTAL EXAMINATION UNDER ANESTHESIA     RIGHT/LEFT HEART CATH AND CORONARY ANGIOGRAPHY N/A 09/06/2019   Procedure: RIGHT/LEFT HEART CATH AND CORONARY ANGIOGRAPHY;  Surgeon: Laurey Morale, MD;  Location: [facility] INVASIVE CV LAB;  Service: Cardiovascular;  Laterality: N/A;   Patient Active Problem List   Diagnosis Date Noted   Acute left-sided low back pain without sciatica 10/22/2023   Encounter for annual physical exam  06/28/2023   Encounter for annual wellness visit (AWV) in Medicare patient 06/28/2023   Smoking greater than 40 pack years 06/28/2023   Acquired thrombophilia (HCC) 06/28/2023   History of elevated glucose 06/28/2023   Rotator cuff arthropathy 06/28/2023   Poor dentition 06/28/2023   Knee pain 07/27/2022   Normocytic anemia 07/27/2022   Prediabetes 07/08/2021   Foot deformity, right 03/25/2020   Lower extremity numbness 03/25/2020   Persistent atrial fibrillation (HCC) 12/20/2019   Secondary hypercoagulable state (HCC) 12/20/2019   Chronic systolic heart failure (HCC) 09/06/2019   Healthcare maintenance 07/11/2019   Tobacco use 07/11/2019   Hypertension 04/13/2019   Nonischemic cardiomyopathy (HCC) 03/31/2019    PCP: [name], NP   REFERRING PROVIDER: Lisbeth Renshaw, MD  REFERRING DIAG: M79.18 (ICD-10-CM) - Lumbar muscle pain  Rationale for Evaluation and Treatment: Rehabilitation  THERAPY DIAG:  Other low back pain  Other abnormalities of gait and mobility  Muscle weakness (generalized)  ONSET DATE: 11/12/2023  SUBJECTIVE:  SUBJECTIVE STATEMENT:  "Bill"  Pt was sore after last session, reports that after last session he noticed that his pain shifts more to the center vs his R side. Pt unsure if this is from the exercise or from the dry needling. Pt also continues to notice a flare-up of pain on his L side which is where it originated.  Pt reports that standing still continues to hurt the most/remain the most painful, can tolerate standing more if he is walking around.   PERTINENT HISTORY:  PMH: CHF, DDD, persistent a fib, HTN  Per PCP on 10/22/23: The pain was initially noticed after lying prone on the bed for extended periods while doing puzzles, and has since become more  intense and persistent. The patient describes the pain as originating from the left side of the back, occasionally radiating across the back and up to the shoulder.   The patient reports a fall from a height of approximately 10-12 inches a week ago, which resulted in a spasm in the back and a subsequent increase in pain intensity. The patient also reports a history of a disc problem in the spine, but notes that the current pain is different and located off to the side.  PAIN:  Are you having pain? Yes: NPRS scale: 3-4 Pain location: R lower back (about 2 hands up from waist) Pain description: Soreness Aggravating factors: laying down, standing up, walking in from the parking lot  Relieving factors: putting gentle pressure on R side and leaning back seems to put it at ease  PRECAUTIONS: Fall  RED FLAGS: None   WEIGHT BEARING RESTRICTIONS: No  FALLS:  Has patient fallen in last 6 months? Yes. 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   PLOF: Independent  PATIENT GOALS: Wants to get rid of the pain, or at least be able to do a few things   NEXT MD VISIT: Reports no MD follow-up   OBJECTIVE:  Note: Objective measures were completed at Evaluation unless otherwise noted.  DIAGNOSTIC FINDINGS:  Lumbar spine X-ray 10/22/23:  IMPRESSION: Moderate progressive degenerative changes of the lumbar spine diffusely.   There is moderate compression T12, not seen in 2017 but in principle is age-indeterminate. Please correlate for any known history or dedicated workup to confirm age and etiology when clinically appropriate with cross-sectional imaging.  PATIENT SURVEYS:  Modified Oswestry 28/50 = Severe disability    COGNITION: Overall cognitive status: Within functional limits for tasks assessed     SENSATION: Pt denies numbness/tingling in legs   MUSCLE LENGTH: Hamstrings: Right Approx. 40-45 degrees, L Approx. 60  degrees   POSTURE: rounded shoulders, posterior pelvic tilt, and weight shift left Sits with legs crossed   PALPATION: TTP R lumbar paraspinals   LUMBAR ROM:   AROM eval  Flexion Can touch toes, but needs to bend knees  Extension 50% limited  Right lateral flexion 50% limited, needs to bend knee  Left lateral flexion 25% limited, needs to bend knee   Right rotation [facility], feels a stretch  Left rotation WFL, feels a stretch   (Blank rows = not tested)  LOWER EXTREMITY ROM:     Passive  Right eval Left eval  Hip flexion Pt reporting extreme pain at 90 degrees  WFL  Hip extension    Hip abduction    Hip adduction    Hip internal rotation Limited [facility]  Hip external rotation [facility]  Knee flexion    Knee extension    Ankle dorsiflexion    Ankle plantarflexion    Ankle inversion    Ankle eversion     (Blank rows = not tested)  LOWER EXTREMITY MMT:    MMT Right eval Left eval  Hip flexion 3+ (could feel strain on his back) 4+  Hip extension    Hip abduction    Hip adduction    Hip internal rotation    Hip external rotation    Knee flexion 4+ 5  Knee extension 4+ 3-  Ankle dorsiflexion 5 5  Ankle plantarflexion    Ankle inversion    Ankle eversion     (Blank rows = not tested)  LUMBAR SPECIAL TESTS:  Straight leg raise test: negative bilaterally, pt just reporting tightness in hamstrings  Slump test: Negative bilaterally, pt just reporting tightness in hamstrings   GAIT: Distance walked: Clinic distances  Assistive device utilized: None Level of assistance: Modified independence Comments: Slowed gait speed, antalgic due to pain.    TREATMENT DATE:    [employer]Act Trialed use of theracane to target thoracic paraspinals. Reviewed illustration of thoracic paraspinals to educate pt on location of his pain/trigger points. Pt feels some relief with use of theracane, provided information for where he can purchase device. Pt does not shop online, is going to look for  device at [facility].  Discussion with patient regarding his ongoing pain with minimal improvement in his symptoms. Encouraged pt to schedule a follow-up with his PCP or his referring provided regarding his ongoing pain and minimal improvement with therapy. Cancelled patient's PT visit for later this week, he will try to schedule with his provider before his next scheduled visit next week.   PATIENT EDUCATION:  Education details: Continue HEP, use of heat, looking into getting a theracane, PT POC with recommendation that he follow-up with his PCP and will likely plan on d/c from PT due to minimal improvement Person educated: Patient Education method: Explanation and Demonstration Education comprehension: verbalized understanding, returned demonstration, and needs further education  HOME EXERCISE PROGRAM: Access Code: [email or web address] URL: [email or web address] Date: 12/24/2023 Prepared by: Sherlie Ban  Exercises - Lower Trunk Rotations  - 1-2 x daily - 7 x weekly - 1 sets - 10 reps - Supine Single Knee to Chest Stretch  - 1-2 x daily - 7 x weekly - 3 sets - 30 hold - Supine Figure 4 Piriformis Stretch  - 1-2 x daily - 7 x weekly - 3 sets - 30 hold - Seated Hamstring Stretch  - 1-2 x daily - 7 x weekly - 3 sets - 30 hold - Seated Quadratus Lumborum Stretch in Chair  - 1-2 x daily - 7 x weekly - 1 sets - 3 reps - 30 sec hold - Seated Trunk Rotation Stretch  - 1-2 x daily - 7 x weekly - 3 sets - 30 hold   ASSESSMENT:  CLINICAL IMPRESSION: Emphasis of skilled PT session on discussing patient's ongoing pain symptoms and minimal improvement felt since starting PT. Demonstrated use of theracane and pt able to trial device, does have some relief of his pain/tightness with use of this device. Encouraged pt to contact his referring provider and/or his PCP to schedule an appt regarding his ongoing pain and minimal improvement with PT. Cancelled pt's appointment later this week in the  hopes he can get in to see his provider before next scheduled appt next week. Also discussed possibility of d/c from PT next visit  due to minimal improvement, will follow-up with patient regarding the plan next visit. Continue POC.    OBJECTIVE IMPAIRMENTS: Abnormal gait, decreased activity tolerance, decreased mobility, difficulty walking, decreased ROM, decreased strength, hypomobility, increased muscle spasms, impaired flexibility, postural dysfunction, and pain.   ACTIVITY LIMITATIONS: carrying, lifting, bending, standing, squatting, sleeping, stairs, transfers, bed mobility, and locomotion level  PARTICIPATION LIMITATIONS: community activity, occupation, and yard work  PERSONAL FACTORS: Age, Behavior pattern, Past/current experiences, Time since onset of injury/illness/exacerbation, and 3+ comorbidities: CHF, DDD, persistent a fib, HTN  are also affecting patient's functional outcome.   REHAB POTENTIAL: Good  CLINICAL DECISION MAKING: Stable/uncomplicated  EVALUATION COMPLEXITY: Low   GOALS: Goals reviewed with patient? Yes  SHORT TERM GOALS: ALL STGS = LTGS   LONG TERM GOALS: Target date: 01/05/2024  Pt will be independent with final HEP for ROM/strengthening in order to build upon functional gains made in therapy. Baseline: No HEP  Goal status: INITIAL  2.  Pt will report low back pain to 2-3 or less in order to demo decr pain during daily life.  Baseline: 4-5/10  Goal status: INITIAL  3.  Pt will report being able to stand for at least 15-20 minutes in order to demo improved tolerance for ADLs.  Baseline:  Goal status: INITIAL  4.  Pt will decr modified ODI to a 22/50 or less in order to demo decr disability with low back pain.  Baseline: 28/50 Goal status: INITIAL   PLAN:  PT FREQUENCY: 2x/week  PT DURATION: 4 weeks  PLANNED INTERVENTIONS: 97164- PT Re-evaluation, 97110-Therapeutic exercises, 97530- Therapeutic activity, 97112- Neuromuscular re-education,  97535- Self Care, 08657- Manual therapy, [phone]- Gait training, Patient/Family education, Dry Needling, Joint mobilization, Spinal mobilization, Cryotherapy, and Moist heat.  PLAN FOR NEXT SESSION: did he schedule with PCP or referring provider? Do we need to d/c?   add to gentle HEP as needed for stretching/low back ROM. Work on gentle ROM/flexibility and stretching to low back. Gentle core exercises. Repeat DN as needed. Thread the needle at countertop, standing QL stretch, theracane or tennis balls?  Standing chops and lat pulls to support rotation and low back   Peter Congo, PT, DPT Peter Congo, PT, DPT, CSRS   12/28/2023, 11:32 AM

## 2023-12-30 ENCOUNTER — Ambulatory Visit: Payer: Self-pay | Admitting: Physical Therapy

## 2023-12-31 ENCOUNTER — Ambulatory Visit: Payer: 59 | Admitting: Physical Therapy

## 2024-01-03 ENCOUNTER — Ambulatory Visit (INDEPENDENT_AMBULATORY_CARE_PROVIDER_SITE_OTHER): Payer: 59 | Admitting: Nurse Practitioner

## 2024-01-03 ENCOUNTER — Encounter: Payer: Self-pay | Admitting: Nurse Practitioner

## 2024-01-03 VITALS — BP 132/82 | HR 54 | Wt 224.6 lb

## 2024-01-03 DIAGNOSIS — M546 Pain in thoracic spine: Secondary | ICD-10-CM | POA: Diagnosis not present

## 2024-01-03 DIAGNOSIS — M545 Low back pain, unspecified: Secondary | ICD-10-CM | POA: Diagnosis not present

## 2024-01-03 MED ORDER — PREDNISONE 20 MG PO TABS: ORAL_TABLET | ORAL | 0 refills | Status: DC

## 2024-01-03 NOTE — Progress Notes (Signed)
[name], DNP, AGNP-c [facility] [address] [place], [place] [address] [phone]   ACUTE VISIT- ESTABLISHED PATIENT  Blood pressure 132/82, pulse (!) 54, weight 224 lb 9.6 oz (101.9 kg).  Subjective:  HPI Sean Barnett is a 73 y.o. male presents to day for evaluation of acute concern(s).   History of Present Illness The patient presents with worsening right-sided back pain in the setting of historical left sided lumbar pain.  The patient has been experiencing worsening right-sided back pain that began approximately a week after his last visit. Initially perceived as a cramp, the pain has intensified, making it difficult to stand for more than a few minutes. It is described as a cramp-like sensation, distinct from the previous left-sided stabbing pain attributed to a pinched nerve. The pain is primarily located 4 to 8 inches up on the right side of the back, occasionally radiating across the back at the waist. It intensifies with activities such as standing to cook or walking short distances and is somewhat alleviated by applying pressure or heat.  He has tried using Salonpas lidocaine patches and [employer], but the latter caused severe cramping. A heating pad has provided some relief. He has been taking Robaxin, typically one pill in the morning and occasionally another in the afternoon, with infrequent use of ibuprofen. The pain is most severe when lying down, particularly when first getting into bed, but eases after a minute or so. The pain level is described as a 6 or 7 out of 10 during certain activities.  He has undergone physical therapy, which included stretching exercises and dry needling, but reports feeling exhausted after sessions. He has also used a cane with a ball on the end to help alleviate cramps. Despite these efforts, the right-sided pain persists, while the left side has improved significantly.  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 He expresses frustration with the current limitations on his daily activities, such as cooking and running errands, due to the pain.  ROS negative except for what is listed in HPI. History, Medications, Surgery, SDOH, and Family History reviewed and updated as appropriate.  Objective:  Physical Exam Vitals and nursing note reviewed.  Constitutional:      General: He is not in acute distress.    Appearance: Normal appearance.  HENT:     Head: Normocephalic.  Eyes:     Conjunctiva/sclera: Conjunctivae normal.  Neck:     Vascular: No carotid bruit.  Cardiovascular:     Rate and Rhythm: Normal rate and regular rhythm.  Musculoskeletal:        General: Tenderness present.     Cervical back: Normal range of motion and neck supple.     Thoracic back: Tenderness present. Decreased range of motion.     Lumbar back: Tenderness present. Decreased range of motion. Negative right straight leg raise test and negative left straight leg raise test.     Right lower leg: No edema.     Left lower leg: No edema.  Skin:    General: Skin is warm and dry.     Capillary Refill: Capillary refill takes less than 2 seconds.  Neurological:     Mental Status: He is alert and oriented to person, place, and time.     Sensory: No sensory deficit.     Motor: Weakness present.  Coordination: Coordination normal.     Gait: Gait abnormal.     Deep Tendon Reflexes: Reflexes normal.  Psychiatric:        Mood and Affect: Mood normal.        Behavior: Behavior normal.         Assessment & Plan:   Problem List Items Addressed This Visit     Acute right-sided thoracic back pain - Primary   Chronic right-sided back pain, progressively worsening. Pain is cramp-like, located 4-8 inches up on the right side, sometimes spreading across the back at the waist. Intensity reaches 6-7/10,  exacerbated by standing and certain activities. Previous left-sided back pain resolved. Physical therapy partially effective, but significant limitations in daily activities persist. X-ray shows moderate T12 vertebral compression, likely causing nerve impingement and muscle spasms. Differential includes muscle spasm, nerve impingement, and possible inflammatory component. Discussed prednisone taper for inflammation and potential steroid injection if symptoms persist. Explained benefits of back brace and TENS unit. Provided temporary handicap parking permit to alleviate strain from walking long distances.Will send order for MRI.  - Prescribe prednisone taper - Refer to Dr. Isaias Cowman for evaluation and possible steroid injection - Advise use of back brace - Consider use of TENS unit - Provide temporary handicap parking permit      Relevant Medications   predniSONE (DELTASONE) 20 MG tablet   Other Relevant Orders   Ambulatory referral to Orthopedic Surgery   MR Thoracic Spine Wo Contrast   MR Lumbar Spine Wo Contrast   Left lumbar pain   Relevant Medications   predniSONE (DELTASONE) 20 MG tablet   Other Relevant Orders   MR Thoracic Spine Wo Contrast   MR Lumbar Spine Wo Contrast    Time: 52 minutes, >50% spent counseling, care coordination, chart review, and documentation.    [name], DNP, AGNP-c

## 2024-01-03 NOTE — Assessment & Plan Note (Signed)
Chronic right-sided back pain, progressively worsening. Pain is cramp-like, located 4-8 inches up on the right side, sometimes spreading across the back at the waist. Intensity reaches 6-7/10, exacerbated by standing and certain activities. Previous left-sided back pain resolved. Physical therapy partially effective, but significant limitations in daily activities persist. X-ray shows moderate T12 vertebral compression, likely causing nerve impingement and muscle spasms. Differential includes muscle spasm, nerve impingement, and possible inflammatory component. Discussed prednisone taper for inflammation and potential steroid injection if symptoms persist. Explained benefits of back brace and TENS unit. Provided temporary handicap parking permit to alleviate strain from walking long distances.Will send order for MRI.  - Prescribe prednisone taper - Refer to Dr. Isaias Cowman for evaluation and possible steroid injection - Advise use of back brace - Consider use of TENS unit - Provide temporary handicap parking permit

## 2024-01-03 NOTE — Patient Instructions (Signed)
I want you to try the steroid taper over the next several days. This will help with inflammation and should help reduce the spasms and swelling that could be causing the pain.  Keep using the heating pad on it, 20 minutes at a time, to help with the pain and spasms.   We can consider a TENS unit to see if this is helpful. We can see if we can order this through your insurance.

## 2024-01-04 ENCOUNTER — Encounter: Payer: Self-pay | Admitting: Physical Therapy

## 2024-01-04 ENCOUNTER — Ambulatory Visit: Payer: 59 | Attending: Neurosurgery | Admitting: Physical Therapy

## 2024-01-04 DIAGNOSIS — M5459 Other low back pain: Secondary | ICD-10-CM | POA: Diagnosis not present

## 2024-01-04 DIAGNOSIS — M6281 Muscle weakness (generalized): Secondary | ICD-10-CM | POA: Insufficient documentation

## 2024-01-04 DIAGNOSIS — R2689 Other abnormalities of gait and mobility: Secondary | ICD-10-CM | POA: Diagnosis not present

## 2024-01-04 NOTE — Therapy (Signed)
 OUTPATIENT PHYSICAL THERAPY THORACOLUMBAR TREATMENT/DISCHARGE SUMMARY   Patient Name: Sean Barnett MRN: [id number] DOB:July 12, 1951, 73 y.o., male Today's Date: 01/04/2024  END OF SESSION:  PT End of Session - 01/04/24 1103     Visit Number 7    Number of Visits 9    Date for PT Re-Evaluation 01/07/24    Authorization Type [employer] MEDICARE    PT Start Time 1102    PT Stop Time 1130   full time not used due to D/C visit   PT Time Calculation (min) 28 min    Activity Tolerance Patient limited by pain    Behavior During Therapy [facility] for tasks assessed/performed               Past Medical History:  Diagnosis Date   Bursitis of elbow 03/2019   left elbow   CHF (congestive heart failure) (HCC)    DDD (degenerative disc disease), lumbar    Dyspnea 03/30/2019   Insomnia 03/30/2019   Nonischemic cardiomyopathy (HCC)    Nonobstructive CAD    Persistent atrial fibrillation (HCC)    Septic bursitis of elbow, left 04/13/2019   Past Surgical History:  Procedure Laterality Date   ATRIAL FIBRILLATION ABLATION N/A 12/07/2019   Procedure: ATRIAL FIBRILLATION ABLATION;  Surgeon: Kelsie Agent, MD;  Location: [facility] INVASIVE CV LAB;  Service: Cardiovascular;  Laterality: N/A;   CARDIOVERSION N/A 09/27/2019   Procedure: CARDIOVERSION;  Surgeon: Rolan Ezra RAMAN, MD;  Location: [facility] ENDOSCOPY;  Service: Cardiovascular;  Laterality: N/A;   CARDIOVERSION N/A 11/30/2019   Procedure: CARDIOVERSION;  Surgeon: Rolan Ezra RAMAN, MD;  Location: [facility] ENDOSCOPY;  Service: Cardiovascular;  Laterality: N/A;   DENTAL EXAMINATION UNDER ANESTHESIA     RIGHT/LEFT HEART CATH AND CORONARY ANGIOGRAPHY N/A 09/06/2019   Procedure: RIGHT/LEFT HEART CATH AND CORONARY ANGIOGRAPHY;  Surgeon: Rolan Ezra RAMAN, MD;  Location: [facility] INVASIVE CV LAB;  Service: Cardiovascular;  Laterality: N/A;   Patient Active Problem List   Diagnosis Date Noted   Acute right-sided thoracic back pain 01/03/2024   Left lumbar pain  10/22/2023   Encounter for annual physical exam 06/28/2023   Encounter for annual wellness visit (AWV) in Medicare patient 06/28/2023   Smoking greater than 40 pack years 06/28/2023   Acquired thrombophilia (HCC) 06/28/2023   History of elevated glucose 06/28/2023   Rotator cuff arthropathy 06/28/2023   Poor dentition 06/28/2023   Knee pain 07/27/2022   Normocytic anemia 07/27/2022   Prediabetes 07/08/2021   Foot deformity, right 03/25/2020   Lower extremity numbness 03/25/2020   Persistent atrial fibrillation (HCC) 12/20/2019   Secondary hypercoagulable state (HCC) 12/20/2019   Chronic systolic heart failure (HCC) 09/06/2019   Healthcare maintenance 07/11/2019   Tobacco use 07/11/2019   Hypertension 04/13/2019   Nonischemic cardiomyopathy (HCC) 03/31/2019    PCP: Oris Camie BRAVO, NP   REFERRING PROVIDER: Lanis Pupa, MD  REFERRING DIAG: M79.18 (ICD-10-CM) - Lumbar muscle pain  Rationale for Evaluation and Treatment: Rehabilitation  THERAPY DIAG:  Other low back pain  Other abnormalities of gait and mobility  Muscle weakness (generalized)  ONSET DATE: 11/12/2023  SUBJECTIVE:  SUBJECTIVE STATEMENT:  Zell Coombe his PCP yesterday and was prescribed Prednisone , reports he still has to go pick it up. Was referred to Dr. Baird a different orthopedic doctor for his back. Ordered MRIs for his lumbar and thoracic spine. Has been working on exercises and dry needling and they have not done anything to help his pain. Reports pain and standing tolerance is still the same. Has to take 30 seconds when standing to get focused and feel like he is balanced, but as soon as he gets up the pain resurges.  Reports L side doesn't bother him, notes on the R side the minimum it stays at is a 2 and then it  keeps going up. Got a theracane and reports it has relieved the knot/tightness feeling    PERTINENT HISTORY:  PMH: CHF, DDD, persistent a fib, HTN  Per PCP on 10/22/23: The pain was initially noticed after lying prone on the bed for extended periods while doing puzzles, and has since become more intense and persistent. The patient describes the pain as originating from the left side of the back, occasionally radiating across the back and up to the shoulder.   The patient reports a fall from a height of approximately 10-12 inches a week ago, which resulted in a spasm in the back and a subsequent increase in pain intensity. The patient also reports a history of a disc problem in the spine, but notes that the current pain is different and located off to the side.  PAIN:  Are you having pain? Yes: NPRS scale: 3-4 Pain location: R lower back (about 2 hands up from waist) Pain description: Soreness Aggravating factors: laying down, standing up, walking in from the parking lot  Relieving factors: putting gentle pressure on R side and leaning back seems to put it at ease  PRECAUTIONS: Fall  RED FLAGS: None   WEIGHT BEARING RESTRICTIONS: No  FALLS:  Has patient fallen in last 6 months? Yes. 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   PLOF: Independent  PATIENT GOALS: Wants to get rid of the pain, or at least be able to do a few things   NEXT MD VISIT: Reports no MD follow-up   OBJECTIVE:  Note: Objective measures were completed at Evaluation unless otherwise noted.  DIAGNOSTIC FINDINGS:  Lumbar spine X-ray 10/22/23:  IMPRESSION: Moderate progressive degenerative changes of the lumbar spine diffusely.   There is moderate compression T12, not seen in 2017 but in principle is age-indeterminate. Please correlate for any known history or dedicated workup to confirm age and etiology when clinically appropriate with  cross-sectional imaging.  PATIENT SURVEYS:  Modified Oswestry 28/50 = Severe disability    COGNITION: Overall cognitive status: Within functional limits for tasks assessed     SENSATION: Pt denies numbness/tingling in legs   MUSCLE LENGTH: Hamstrings: Right Approx. 40-45 degrees, L Approx. 60 degrees   POSTURE: rounded shoulders, posterior pelvic tilt, and weight shift left Sits with legs crossed   PALPATION: TTP R lumbar paraspinals   LUMBAR ROM:   AROM eval  Flexion Can touch toes, but needs to bend knees  Extension 50% limited  Right lateral flexion 50% limited, needs to bend knee  Left lateral flexion 25% limited, needs to bend knee   Right rotation [facility], feels a stretch  Left rotation WFL, feels a stretch   (Blank rows = not tested)  LOWER EXTREMITY ROM:  Passive  Right eval Left eval  Hip flexion Pt reporting extreme pain at 90 degrees  WFL  Hip extension    Hip abduction    Hip adduction    Hip internal rotation Limited WFL  Hip external rotation [facility]  Knee flexion    Knee extension    Ankle dorsiflexion    Ankle plantarflexion    Ankle inversion    Ankle eversion     (Blank rows = not tested)  LOWER EXTREMITY MMT:    MMT Right eval Left eval  Hip flexion 3+ (could feel strain on his back) 4+  Hip extension    Hip abduction    Hip adduction    Hip internal rotation    Hip external rotation    Knee flexion 4+ 5  Knee extension 4+ 3-  Ankle dorsiflexion 5 5  Ankle plantarflexion    Ankle inversion    Ankle eversion     (Blank rows = not tested)  LUMBAR SPECIAL TESTS:  Straight leg raise test: negative bilaterally, pt just reporting tightness in hamstrings  Slump test: Negative bilaterally, pt just reporting tightness in hamstrings   GAIT: Distance walked: Clinic distances  Assistive device utilized: None Level of assistance: Modified independence Comments: Slowed gait speed, antalgic due to pain.    TREATMENT DATE:     TherAct ODI: 30/50 = Severe Disability   Discussion with patient regarding his ongoing pain with minimal improvement in his symptoms. Will plan to D/C at this time with pt to follow up with orthopedist doctor. Pt has been performing HEP, had dry needling, and purchased a Theracane. Pt's reports that he feels that his mobility has improved, but pain still gets so intense. Pt's low back pain still remains and still has significant difficulty with standing tolerance. Pt reports that the Theracane can help get rid of knots, but minimal change to his pain. Cancelled pt's last appt this week. Pt in agreement with plan.    PATIENT EDUCATION:  Education details: Continue HEP, use of heat, theracane, D/C from PT at this time due to minimal improvement in low back pain  Person educated: Patient Education method: Medical Illustrator Education comprehension: verbalized understanding, returned demonstration, and needs further education  PHYSICAL THERAPY DISCHARGE SUMMARY  Visits from Start of Care: 7  Current functional level related to goals / functional outcomes: See LTGs/Clinical Assessment Statement   ODI: 30/50 = Severe Disability in regards to low back pain    Remaining deficits: Severe low back pain, decr activity tolerance, decr standing tolerance, antalgic gait    Education / Equipment: HEP, trialed dry needling, information on where to purchase theracane for home use    Patient agrees to discharge. Patient goals were not met. Patient is being discharged due to did not respond to therapy.  Pt to follow up with orthopedist    HOME EXERCISE PROGRAM: Access Code: [email or web address] URL: [email or web address] Date: 12/24/2023 Prepared by: Sheffield Senate  Exercises - Lower Trunk Rotations  - 1-2 x daily - 7 x weekly - 1 sets - 10 reps - Supine Single Knee to Chest Stretch  - 1-2 x daily - 7 x weekly - 3 sets - 30 hold - Supine Figure 4 Piriformis Stretch  - 1-2 x  daily - 7 x weekly - 3 sets - 30 hold - Seated Hamstring Stretch  - 1-2 x daily - 7 x weekly - 3 sets - 30 hold - Seated Quadratus Lumborum Stretch in Chair  - 1-2  x daily - 7 x weekly - 1 sets - 3 reps - 30 sec hold - Seated Trunk Rotation Stretch  - 1-2 x daily - 7 x weekly - 3 sets - 30 hold   ASSESSMENT:  CLINICAL IMPRESSION: Due to pt not responding to PT in regards to low back pain, pt scheduled an appt with his PCP. Pt got a referral to an orthopedic doctor, prescribed prednisone , and also had an MRI of his lumbar and thoracic spine ordered. Pt has been consistent with his HEP and has purchased a theracane. Pt reports an improvement in his mobility, but not improvements in his pain. Pt did not meet 3 out of 4 LTGs in regards to pain or standing tolerance. Pt scored a 30/50 on the ODI (previously 28/50), and still having severe disability in regards to low back pain. Pt only met HEP goal. Due to minimal improvement, discussed D/C at this time with orthopedist follow-up. Pt in agreement with plan to D/C at this time.    OBJECTIVE IMPAIRMENTS: Abnormal gait, decreased activity tolerance, decreased mobility, difficulty walking, decreased ROM, decreased strength, hypomobility, increased muscle spasms, impaired flexibility, postural dysfunction, and pain.   ACTIVITY LIMITATIONS: carrying, lifting, bending, standing, squatting, sleeping, stairs, transfers, bed mobility, and locomotion level  PARTICIPATION LIMITATIONS: community activity, occupation, and yard work  PERSONAL FACTORS: Age, Behavior pattern, Past/current experiences, Time since onset of injury/illness/exacerbation, and 3+ comorbidities: CHF, DDD, persistent a fib, HTN  are also affecting patient's functional outcome.   REHAB POTENTIAL: Good  CLINICAL DECISION MAKING: Stable/uncomplicated  EVALUATION COMPLEXITY: Low   GOALS: Goals reviewed with patient? Yes  SHORT TERM GOALS: ALL STGS = LTGS   LONG TERM GOALS: Target date:  01/05/2024  Pt will be independent with final HEP for ROM/strengthening in order to build upon functional gains made in therapy. Baseline: No HEP   Pt has been performing HEP  Goal status: MET  2.  Pt will report low back pain to 2-3 or less in order to demo decr pain during daily life.  Baseline: 4-5/10   3-4/10 low back pain in sitting and pain increases when having to stand up  Goal status: NOT MET  3.  Pt will report being able to stand for at least 15-20 minutes in order to demo improved tolerance for ADLs.  Baseline: can only stand for 2-3 minutes and then pain steadily incr  Goal status: NOT MET  4.  Pt will decr modified ODI to a 22/50 or less in order to demo decr disability with low back pain.  Baseline: 28/50  30/50 on 01/04/24  Goal status: NOT MET    PLAN:  PT FREQUENCY: 2x/week  PT DURATION: 4 weeks  PLANNED INTERVENTIONS: 97164- PT Re-evaluation, 97110-Therapeutic exercises, 97530- Therapeutic activity, 97112- Neuromuscular re-education, 97535- Self Care, [id number]- Manual therapy, [id number]- Gait training, Patient/Family education, Dry Needling, Joint mobilization, Spinal mobilization, Cryotherapy, and Moist heat.  PLAN FOR NEXT SESSION: d/c from PT    Sheffield Senate, PT, DPT 01/04/24 11:34 AM

## 2024-01-07 ENCOUNTER — Ambulatory Visit: Payer: 59 | Admitting: Physical Therapy

## 2024-01-16 ENCOUNTER — Ambulatory Visit
Admission: RE | Admit: 2024-01-16 | Discharge: 2024-01-16 | Disposition: A | Discharge status: Home / Self Care | Payer: 59 | Source: Ambulatory Visit | Attending: Nurse Practitioner

## 2024-01-16 ENCOUNTER — Ambulatory Visit
Admission: RE | Admit: 2024-01-16 | Discharge: 2024-01-16 | Disposition: A | Discharge status: Home / Self Care | Payer: 59 | Source: Ambulatory Visit | Attending: Nurse Practitioner | Admitting: Nurse Practitioner

## 2024-01-16 DIAGNOSIS — M546 Pain in thoracic spine: Secondary | ICD-10-CM

## 2024-01-16 DIAGNOSIS — M4804 Spinal stenosis, thoracic region: Secondary | ICD-10-CM | POA: Diagnosis not present

## 2024-01-16 DIAGNOSIS — M4854XA Collapsed vertebra, not elsewhere classified, thoracic region, initial encounter for fracture: Secondary | ICD-10-CM | POA: Diagnosis not present

## 2024-01-16 DIAGNOSIS — M545 Low back pain, unspecified: Secondary | ICD-10-CM

## 2024-01-16 DIAGNOSIS — M48061 Spinal stenosis, lumbar region without neurogenic claudication: Secondary | ICD-10-CM | POA: Diagnosis not present

## 2024-01-24 DIAGNOSIS — S22080A Wedge compression fracture of T11-T12 vertebra, initial encounter for closed fracture: Secondary | ICD-10-CM | POA: Diagnosis not present

## 2024-01-24 DIAGNOSIS — I714 Abdominal aortic aneurysm, without rupture, unspecified: Secondary | ICD-10-CM | POA: Diagnosis not present

## 2024-01-24 DIAGNOSIS — M47896 Other spondylosis, lumbar region: Secondary | ICD-10-CM | POA: Diagnosis not present

## 2024-02-03 DIAGNOSIS — M47816 Spondylosis without myelopathy or radiculopathy, lumbar region: Secondary | ICD-10-CM | POA: Diagnosis not present

## 2024-02-11 ENCOUNTER — Other Ambulatory Visit (HOSPITAL_COMMUNITY): Payer: Self-pay | Admitting: Cardiology

## 2024-02-22 DIAGNOSIS — M47816 Spondylosis without myelopathy or radiculopathy, lumbar region: Secondary | ICD-10-CM | POA: Diagnosis not present

## 2024-03-20 ENCOUNTER — Other Ambulatory Visit: Payer: Self-pay | Admitting: Nurse Practitioner

## 2024-03-20 DIAGNOSIS — M545 Low back pain, unspecified: Secondary | ICD-10-CM

## 2024-03-20 NOTE — Telephone Encounter (Signed)
 Last apt 01/03/24

## 2024-03-22 ENCOUNTER — Ambulatory Visit: Payer: Self-pay

## 2024-03-22 NOTE — Telephone Encounter (Signed)
 Copied from CRM [id number]. Topic: Clinical - Red Word Triage >> Mar 22, 2024  2:50 PM Donald Frost wrote: Red Word that prompted transfer to Nurse Triage: The patient called in stating he had an accident back in November and has been dealing with back pain since. He states he had an injection early on that helped but it is worse again. His injury was on the left side and was thought to be a pinched nerve and he feels like that is back again and the pain is getting worse. He has had scans and seen a Specialist. I will transfer him to E2C2 NT   Chief Complaint: Back pain Symptoms: Lower back pain Frequency: Constant  Pertinent Negatives: Patient denies numbness, weakness, loss of bowel or bladder control Disposition: [] ED /[] Urgent Care (no appt availability in office) / [x] Appointment(In office/virtual)/ []  [facility]/ [] Home Care/ [] Refused Recommended Disposition /[] [facility]/ []  Follow-up with PCP Additional Notes: Patient reports he has had lower back pain for the last 3 weeks. He states his pain is on the right, and that it is irritating a previous back injury on his left lower back as well. He states his pain is constant, is worse in the morning, and is 4/10 at this time. He denies any other symptom or complaint at this time. Appointment made for the patient tomorrow for evaluation of his pain.     Reason for Disposition  [1] MODERATE back pain (e.g., interferes with normal activities) AND [2] present > 3 days  Answer Assessment - Initial Assessment Questions 1. ONSET: "When did the pain begin?"      3 weeks 2. LOCATION: "Where does it hurt?" (upper, mid or lower back)     Right sided lower back 3. SEVERITY: "How bad is the pain?"  (e.g., Scale 1-10; mild, moderate, or severe)   - MILD (1-3): Doesn't interfere with normal activities.    - MODERATE (4-7): Interferes with normal activities or awakens from sleep.    - SEVERE (8-10): Excruciating pain, unable to do any  normal activities.      5/10 4. PATTERN: "Is the pain constant?" (e.g., yes, no; constant, intermittent)      Constant  5. RADIATION: "Does the pain shoot into your legs or somewhere else?"     No radiation  6. CAUSE:  "What do you think is causing the back pain?"      Previous injury to back  7. BACK OVERUSE:  "Any recent lifting of heavy objects, strenuous work or exercise?"     No 8. MEDICINES: "What have you taken so far for the pain?" (e.g., nothing, acetaminophen , NSAIDS)     Ibuprofen  9. NEUROLOGIC SYMPTOMS: "Do you have any weakness, numbness, or problems with bowel/bladder control?"     No 10. OTHER SYMPTOMS: "Do you have any other symptoms?" (e.g., fever, abdomen pain, burning with urination, blood in urine)       No  Protocols used: Back Pain-A-AH

## 2024-03-22 NOTE — Progress Notes (Unsigned)
 No chief complaint on file.   Patient reports he has had lower back pain for the last 3 weeks. He states his pain is on the right, and that it is irritating a previous back injury on his left lower back as well. He states his pain is constant, is worse in the morning, and is 4/10 at this time. He denies any other symptom or complaint at this time.   Pt had a fall in 10/2023, with back pain since then.  He underwent MRI of thoracic and lumbar spine in 01/2024, which showed subacute T12 compression fracture, lumbar DDD, AAA as noted below. He saw Dr. Rexanne Catalina in 01/2024, and subsequently underwent right L3-L4 medial branch and L5 dorsal ramus block.  He had injections on 3/6 and 3/25 per records. He reports some relief from the injection, but increased LBP for the last 3 weeks.   MRI thoracic and lumbar spine 01/2024: IMPRESSION: 1. Subacute T12 compression fracture with progressive, severe height loss. Abnormal marrow signal throughout the T12 vertebral body and within the paraspinal soft tissues, potentially reflecting persistent edema with a pathologic fracture not excluded but considered less likely. If vertebral augmentation were to be performed, one might consider obtaining a biopsy at the same time to exclude an underlying lesion. 2. Mild spinal stenosis at T11-12. 3. Mild spinal stenosis and mild-to-moderate neural foraminal stenosis at L3-4. 4. Moderate right neural foraminal stenosis at L4-5. 5. Mild-to-moderate neural foraminal stenosis at L5-S1. 6. Abdominal aortic aneurysm measuring 3.8 cm. Recommend surveillance ultrasound in 3 years.   PMH, PSH, SH reviewed Afib, nonischemic cardiomyopathy, CHF    ROS:    PHYSICAL EXAM:  There were no vitals taken for this visit.      ASSESSMENT/PLAN:   He should be able to just f/u with Dr. Rexanne Catalina. Not sure why he is here

## 2024-03-23 ENCOUNTER — Telehealth (HOSPITAL_COMMUNITY): Payer: Self-pay | Admitting: Cardiology

## 2024-03-23 ENCOUNTER — Ambulatory Visit (INDEPENDENT_AMBULATORY_CARE_PROVIDER_SITE_OTHER): Admitting: Family Medicine

## 2024-03-23 ENCOUNTER — Encounter: Payer: Self-pay | Admitting: Family Medicine

## 2024-03-23 VITALS — BP 120/60 | HR 60 | Ht 74.0 in | Wt 219.8 lb

## 2024-03-23 DIAGNOSIS — Z7901 Long term (current) use of anticoagulants: Secondary | ICD-10-CM

## 2024-03-23 DIAGNOSIS — M545 Low back pain, unspecified: Secondary | ICD-10-CM

## 2024-03-23 DIAGNOSIS — F17210 Nicotine dependence, cigarettes, uncomplicated: Secondary | ICD-10-CM | POA: Diagnosis not present

## 2024-03-23 MED ORDER — TRAMADOL HCL 50 MG PO TABS: 50.0000 mg | ORAL_TABLET | Freq: Three times a day (TID) | ORAL | 0 refills | Status: AC | PRN

## 2024-03-23 NOTE — Telephone Encounter (Signed)
 Called to confirm/remind patient of their appointment at the [facility] on 02/2424 .   Appointment:   03/23/24 Confirmed  [] Left mess   [] No answer/No voice mail  [] VM Full/unable to leave message  [] Phone not in service  Patient reminded to bring all medications and/or complete list.  Confirmed patient has transportation. Gave directions, instructed to utilize valet parking.

## 2024-03-23 NOTE — Patient Instructions (Signed)
 I encourage you to quit smoking.  This can be a factor related to back pain and slower healing (along with all the other known risks--cardiovascular disease, cancer, etc).  Do not take ibuprofen (Advil)--you are taking eliquis  which is a blood thinner, and this will increase your risk for bleeding. I'm prescribing a pain medication called tramadol . Take this stronger pain medication in place of the advil, when all other pain relief measures fail and your pain is severe. This may cause sedation. Doesn't use it often.  Please use extra strength or Arthritis version of Tylenol  for pain. You can continue to use lidocaine  patches (SalonPas) as needed You can try heat and massage to the area of muscular prominence. You can also use the muscle relaxant as needed (methocarbamol ). This was just refilled earlier this week, and should be at your pharmacy.   Please schedule a follow-up appointment with Dr. Rexanne Catalina for ongoing evaluation and treatment of your pain.

## 2024-03-24 ENCOUNTER — Encounter (HOSPITAL_COMMUNITY): Payer: Self-pay | Admitting: Cardiology

## 2024-03-24 ENCOUNTER — Ambulatory Visit (HOSPITAL_COMMUNITY)
Admission: RE | Admit: 2024-03-24 | Discharge: 2024-03-24 | Disposition: A | Discharge status: Home / Self Care | Source: Ambulatory Visit | Attending: Cardiology | Admitting: Cardiology

## 2024-03-24 VITALS — BP 118/78 | HR 67 | Ht 74.0 in | Wt 220.2 lb

## 2024-03-24 DIAGNOSIS — I428 Other cardiomyopathies: Secondary | ICD-10-CM | POA: Insufficient documentation

## 2024-03-24 DIAGNOSIS — I480 Paroxysmal atrial fibrillation: Secondary | ICD-10-CM | POA: Diagnosis not present

## 2024-03-24 DIAGNOSIS — M545 Low back pain, unspecified: Secondary | ICD-10-CM | POA: Diagnosis not present

## 2024-03-24 DIAGNOSIS — F17210 Nicotine dependence, cigarettes, uncomplicated: Secondary | ICD-10-CM | POA: Diagnosis not present

## 2024-03-24 DIAGNOSIS — N183 Chronic kidney disease, stage 3 unspecified: Secondary | ICD-10-CM | POA: Diagnosis not present

## 2024-03-24 DIAGNOSIS — Z7901 Long term (current) use of anticoagulants: Secondary | ICD-10-CM | POA: Diagnosis not present

## 2024-03-24 DIAGNOSIS — I4510 Unspecified right bundle-branch block: Secondary | ICD-10-CM | POA: Diagnosis not present

## 2024-03-24 DIAGNOSIS — I5022 Chronic systolic (congestive) heart failure: Secondary | ICD-10-CM | POA: Diagnosis not present

## 2024-03-24 DIAGNOSIS — Z79899 Other long term (current) drug therapy: Secondary | ICD-10-CM | POA: Diagnosis not present

## 2024-03-24 DIAGNOSIS — I130 Hypertensive heart and chronic kidney disease with heart failure and stage 1 through stage 4 chronic kidney disease, or unspecified chronic kidney disease: Secondary | ICD-10-CM | POA: Diagnosis not present

## 2024-03-24 DIAGNOSIS — I2510 Atherosclerotic heart disease of native coronary artery without angina pectoris: Secondary | ICD-10-CM | POA: Insufficient documentation

## 2024-03-24 LAB — BASIC METABOLIC PANEL WITH GFR
Anion gap: 8 (ref 5–15)
BUN: 16 mg/dL (ref 8–23)
CO2: 24 mmol/L (ref 22–32)
Calcium: 8.7 mg/dL — ABNORMAL LOW (ref 8.9–10.3)
Chloride: 106 mmol/L (ref 98–111)
Creatinine, Ser: 1.5 mg/dL — ABNORMAL HIGH (ref 0.61–1.24)
GFR, Estimated: 49 mL/min — ABNORMAL LOW (ref >60)
Glucose, Bld: 118 mg/dL — ABNORMAL HIGH (ref 70–99)
Potassium: 4 mmol/L (ref 3.5–5.1)
Sodium: 138 mmol/L (ref 135–145)

## 2024-03-24 LAB — CBC
HCT: 35.9 % — ABNORMAL LOW (ref 39.0–52.0)
Hemoglobin: 11.9 g/dL — ABNORMAL LOW (ref 13.0–17.0)
MCH: 29 pg (ref 26.0–34.0)
MCHC: 33.1 g/dL (ref 30.0–36.0)
MCV: 87.6 fL (ref 80.0–100.0)
Platelets: 278 10*3/uL (ref 150–400)
RBC: 4.1 MIL/uL — ABNORMAL LOW (ref 4.22–5.81)
RDW: 14.6 % (ref 11.5–15.5)
WBC: 8.2 10*3/uL (ref 4.0–10.5)
nRBC: 0 % (ref 0.0–0.2)

## 2024-03-24 LAB — LIPID PANEL
Cholesterol: 77 mg/dL (ref 0–200)
HDL: 26 mg/dL — ABNORMAL LOW (ref >40)
LDL Cholesterol: 40 mg/dL (ref 0–99)
Total CHOL/HDL Ratio: 3 ratio
Triglycerides: 54 mg/dL (ref <150)
VLDL: 11 mg/dL (ref 0–40)

## 2024-03-24 LAB — BRAIN NATRIURETIC PEPTIDE: B Natriuretic Peptide: 117.5 pg/mL — ABNORMAL HIGH (ref 0.0–100.0)

## 2024-03-24 NOTE — Patient Instructions (Signed)
 There has been no changes to your medications.  Labs done today, your results will be available in MyChart, we will contact you for abnormal readings.  Your physician recommends that you schedule a follow-up appointment in: 6 months.  If you have any questions or concerns before your next appointment please send [place] a message through [place] or call our office at [phone].    TO LEAVE A MESSAGE FOR THE NURSE SELECT OPTION 2, PLEASE LEAVE A MESSAGE INCLUDING: YOUR NAME DATE OF BIRTH CALL BACK NUMBER REASON FOR CALL**this is important as we prioritize the call backs  YOU WILL RECEIVE A CALL BACK THE SAME DAY AS LONG AS YOU CALL BEFORE 4:00 PM  At the [facility], you and your health needs are our priority. As part of our continuing mission to provide you with exceptional heart care, we have created designated Provider Care Teams. These Care Teams include your primary Cardiologist (physician) and Advanced Practice Providers (APPs- Physician Assistants and Nurse Practitioners) who all work together to provide you with the care you need, when you need it.   You may see any of the following providers on your designated Care Team at your next follow up: Dr Arvilla Meres Dr Marca Ancona Dr. Dorthula Nettles Dr. Clearnce Hasten Amy Filbert Schilder, NP Robbie Lis, [place] [facility] [place], [place] Brynda Peon, NP [place] Lee, NP Clarisa Kindred, NP Karle Plumber, PharmD Enos Fling, PharmD   Please be sure to bring in all your medications bottles to every appointment.    Thank you for choosing [facility]

## 2024-03-26 NOTE — Progress Notes (Signed)
 PCP: Annella Kief, NP Cardiology: Dr. Mitzie Anda  Chief complaint: CHF  73 y.o. with history of HTN and smoking was recently diagnosed with atrial fibrillation and chronic systolic CHF.  Patient was admitted in 4/20 with left elbow septic bursitis.  Several weeks prior to the admission, he had been feeling palpitations and had been tiring more easily.  At the time of admission, he reported chest pressure.  He was found to be in atrial fibrillation with RVR.  Echo was done, showing EF depressed to 30-35%.  He was started on Coreg  and it was titrated up to control his HR.  He was started on Eliquis .  He was not cardioverted.   In 10/20, he had LHC/RHC showing moderate nonobstructive CAD, elevated PCWP, and CI 1.85.  He was started on digoxin .  Three weeks later after restarting apixaban  he had had successful DCCV to NSR later in 10/20.   He had recurrent atrial fibrillation and failed DCCV in 12/20.  He then had atrial fibrillation ablation in 1/21.  He is now off amiodarone .   Cardiac MRI (1/21) with LV EF 47%, RV EF 46%, basal inferolateral and apical lateral LGE, looks like coronary disease pattern.   Echo in 9/21 showed EF improved to 55-60%, normal RV.  Echo 5/23 showed EF 55% with normal RV, mild biatrial enlargement.      Echo 8/24 EF 60-65%, normal RV  Today he returns for HF follow up. Still smoking, never tried Chantix .  Main complaint is low back pain, seeing Dr. Rexanne Catalina.  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  Weight down 15 lbs since most of his teeth were pulled. Getting dentures.  He is in NSR today, no palpitations.  He fatigues walking longer distances, thinks this is due to back pain.  No significant dyspnea or chest pain.   ECG (personally reviewed): NSR, iRBBB  Labs (5/20): LDL 65 Labs (8/20): K 4.5, creatinine 1.1, hgb 13.1 Labs (11/20): K 4, creatinine 1.08, hgb 15, digoxin  0.7 Labs (12/20): LDL 56 Labs (1/21): K 4.3, creatinine 1.2 Labs  (2/21): K 4.4, creatinine 1.19 Labs (7/21): K 4.9, creatinine 1.69 Labs (10/21): K 4.6, creatinine 1.69 Labs (4/22): K 4.7, creatinine 1.47 Labs (10/22): K 4.3, creatinine 1.49 Labs (5/23): K 4.3, creatinine 1.34 Labs (8/23): LDL 46, HDL 32 Labs (6/24): K 4.9, creatinine 1.5, hgb 12.5 Labs (8/24): K 4.1, creatinine 1.42, LDL 41 Labs (12/24): K 4.1, creatinine 1.38  PMH: 1. HTN 2. Atrial fibrillation: Paroxysmal. - DCCV to NSR in 10/20.  - Atrial fibrillation ablation 1/21.  3. Chronic systolic CHF: Nonischemic cardiomyopathy.  echo (4/20) with EF 30-35%, mildly decreased RV systolic function.  - LHC/RHC (10/20): 60% mid LAD, 40% PLOM, 50% dRCA; mean RA 2, PA 45/20, mean PCWP 22, CI 1.85, PVR 2.3 WU.  - Cardiac MRI (1/21): LV EF 47%, RV EF 46%, basal inferolateral and apical lateral LGE, looks like coronary disease pattern.  - Echo (9/21): EF 55-60% with normal RV.  - Echo (5/23): EF 55% with normal RV, mild biatrial enlargement.  4. H/o septic bursitis 5. CAD: LHC in 10/20 with moderate nonobstructive disease (see above).  6. CKD stage 3  SH: Works as Music therapist, widowed, smokes 1 ppd. Lives in [place].   FH: Father with lung cancer.  Brother with CABG in his 42s.   ROS: All systems reviewed and negative except as per HPI.   Current Outpatient Medications  Medication Sig Dispense Refill  carvedilol  (COREG ) 6.25 MG tablet TAKE 1 TABLET BY MOUTH TWICE A DAY WITH FOOD 180 tablet 3   ELIQUIS  5 MG TABS tablet TAKE 1 TABLET BY MOUTH TWICE A DAY 180 tablet 3   ENTRESTO  24-26 MG TAKE 1 TABLET BY MOUTH TWICE A DAY 60 tablet 11   ibuprofen (ADVIL) 200 MG tablet Take 800 mg by mouth every 6 (six) hours as needed.     rosuvastatin  (CRESTOR ) 10 MG tablet TAKE 1 TABLET BY MOUTH EVERY DAY 90 tablet 2   traMADol  (ULTRAM ) 50 MG tablet Take 1 tablet (50 mg total) by mouth every 8 (eight) hours as needed for up to 5 days for severe pain (pain score 7-10). 10 tablet 0   No current  facility-administered medications for this encounter.   Wt Readings from Last 3 Encounters:  03/24/24 99.9 kg (220 lb 3.2 oz)  03/23/24 99.7 kg (219 lb 12.8 oz)  01/03/24 101.9 kg (224 lb 9.6 oz)   BP 118/78   Pulse 67   Ht 6\' 2"  (1.88 m)   Wt 99.9 kg (220 lb 3.2 oz)   SpO2 97%   BMI 28.27 kg/m  General: NAD Neck: No JVD, no thyromegaly or thyroid  nodule.  Lungs: Distant breath sounds.  CV: Nondisplaced PMI.  Heart regular S1/S2, no S3/S4, no murmur.  No peripheral edema.  No carotid bruit.  Normal pedal pulses.  Abdomen: Soft, nontender, no hepatosplenomegaly, no distention.  Skin: Intact without lesions or rashes.  Neurologic: Alert and oriented x 3.  Psych: Normal affect. Extremities: No clubbing or cyanosis.  HEENT: Normal.   Assessment/Plan: 1. Chronic systolic CHF: Echo in 4/20 with EF 30-35%, mildly decreased RV systolic function.  Nonischemic cardiomyopathy, RHC/LHC in 10/20 showed nonobstructive CAD and low output with CI 1.85.  It is possible that this is a tachycardia-mediated cardiomyopathy with EF down because of atrial fibrillation.  It is also possible that the cardiomyopathy is from some other cause and that the cardiomyopathy itself predisposed him to atrial fibrillation.  Cardiac MRI in 1/21 showed some improvement, with LV EF 47% and normal RV, there was coronary pattern LGE in the inferolateral wall.  Echo in 9/21 showed EF up to 55-60% with normal RV, echo in 5/23 similarly showed EF 55% with normal RV, mild biatrial enlargement.  Echo 8/24 EF 60-65%, normal RV. NYHA class II, not volume overloaded on exam.   - Continue Coreg  6.25 mg bid, dose decreased in the past due to bradycardia.   - Continue Entresto  24/26 bid. BMET today. - He did not tolerate Farxiga  due to itching.  - He is off spironolactone .  With improved EF, I think he can stay off (but would continue Entresto  and Coreg ).   2. Atrial fibrillation: Paroxysmal.  Given concern for tachycardia-mediated  CMP, we need to keep him in NSR.  He had DCCV to NSR in 10/20 but afib recurred.  He had atrial fibrillation ablation in 1/21.  He is in sinus today. Amiodarone  was stopped post-ablation.   - Continue Eliquis  5 mg bid. CBC today.  3. Smoking: I strongly recommended that he quit.   - He has not wanted to try Chantix .  4. HTN: BP controlled today. - Continue GDMT as above. 5. CAD: Moderate nonobstructive CAD on 10/20 cath. No chest pain. - No ASA given apixaban  use.  - Continue Crestor  10 mg daily, check lipids today.   Follow up in 6 months with APP  I spent 21 minutes reviewing records, interviewing/examining patient,  and managing orders.    Sean Barnett  03/26/2024

## 2024-06-06 ENCOUNTER — Ambulatory Visit

## 2024-06-06 VITALS — BP 138/80 | HR 69 | Temp 97.7°F | Ht 74.0 in | Wt 221.2 lb

## 2024-06-06 DIAGNOSIS — Z0000 Encounter for general adult medical examination without abnormal findings: Secondary | ICD-10-CM | POA: Diagnosis not present

## 2024-06-06 NOTE — Patient Instructions (Addendum)
 Mr. Sean Barnett , Thank you for taking time out of your busy schedule to complete your Annual Wellness Visit with me. I enjoyed our conversation and look forward to speaking with you again next year. I, as well as your care team,  appreciate your ongoing commitment to your health goals. Please review the following plan we discussed and let me know if I can assist you in the future. Your Game plan/ To Do List    Referrals: If you haven't heard from the office you've been referred to, please reach out to them at the phone provided.  N/a Follow up Visits: Next Medicare AWV with our clinical staff: 06/12/2025 at 2:50   Have you seen your provider in the last 6 months (3 months if uncontrolled diabetes)? Yes Next Office Visit with your provider: will schedule when back is better  Clinician Recommendations:  Aim for 30 minutes of exercise or brisk walking, 6-8 glasses of water, and 5 servings of fruits and vegetables each day.       This is a list of the screening recommended for you and due dates:  Health Maintenance  Topic Date Due   Pneumococcal Vaccine for age over 68 (1 of 2 - PCV) Never done   Colon Cancer Screening  Never done   Screening for Lung Cancer  Never done   Zoster (Shingles) Vaccine (1 of 2) Never done   COVID-19 Vaccine (1 - 2024-25 season) Never done   Flu Shot  06/30/2024   Medicare Annual Wellness Visit  06/06/2025   DTaP/Tdap/Td vaccine (2 - Td or Tdap) 07/10/2029   Hepatitis C Screening  Completed   Hepatitis B Vaccine  Aged Out   HPV Vaccine  Aged Out   Meningitis B Vaccine  Aged Out    Advanced directives: (Copy Requested) Please bring a copy of your health care power of attorney and living will to the office to be added to your chart at your convenience. You can mail to [facility] [address] [place], [place] [address] or email to [email or web address] Advance Care Planning is important because it:  [x]  Makes sure you receive the medical  care that is consistent with your values, goals, and preferences  [x]  It provides guidance to your family and loved ones and reduces their decisional burden about whether or not they are making the right decisions based on your wishes.  Follow the link provided in your after visit summary or read over the paperwork we have mailed to you to help you started getting your Advance Directives in place. If you need assistance in completing these, please reach out to us  so that we can help you!  See attachments for Preventive Care and Fall Prevention Tips.

## 2024-06-06 NOTE — Progress Notes (Signed)
 Subjective:   Arne Schlender is a 73 y.o. who presents for a Medicare Wellness preventive visit.  As a reminder, Annual Wellness Visits don't include a physical exam, and some assessments may be limited, especially if this visit is performed virtually. We may recommend an in-person follow-up visit with your provider if needed.  Visit Complete: In person    Persons Participating in Visit: Patient.  AWV Questionnaire: No: Patient Medicare AWV questionnaire was not completed prior to this visit.  Cardiac Risk Factors include: advanced age (>47men, >25 women);hypertension;male gender     Objective:    Today's Vitals   06/06/24 1448  BP: 138/80  Pulse: 69  Temp: 97.7 F (36.5 C)  TempSrc: Oral  SpO2: 98%  Weight: 221 lb 3.2 oz (100.3 kg)  Height: 6' 2 (1.88 m)  PainSc: 3    Body mass index is 28.4 kg/m.     06/06/2024    2:57 PM 12/08/2023   11:06 AM 05/06/2023   10:09 AM 07/27/2022   10:13 AM 07/08/2021   11:26 AM 03/25/2020    1:28 PM 12/07/2019    8:38 AM  Advanced Directives  Does Patient Have a Medical Advance Directive? No No Yes No Yes No No  Type of Best boy of [place];Living will  Living will    Copy of Healthcare Power of Attorney in Chart?   No - copy requested      Would patient like information on creating a medical advance directive? No - Patient declined   No - Patient declined  No - Patient declined     Current Medications (verified) Outpatient Encounter Medications as of 06/06/2024  Medication Sig   carvedilol  (COREG ) 6.25 MG tablet TAKE 1 TABLET BY MOUTH TWICE A DAY WITH FOOD   ENTRESTO  24-26 MG TAKE 1 TABLET BY MOUTH TWICE A DAY   ibuprofen (ADVIL) 200 MG tablet Take 800 mg by mouth every 6 (six) hours as needed.   rosuvastatin  (CRESTOR ) 10 MG tablet TAKE 1 TABLET BY MOUTH EVERY DAY   ELIQUIS  5 MG TABS tablet TAKE 1 TABLET BY MOUTH TWICE A DAY (Patient not taking: Reported on 06/06/2024)   No facility-administered encounter  medications on file as of 06/06/2024.    Allergies (verified) Sulfa antibiotics   History: Past Medical History:  Diagnosis Date   Bursitis of elbow 03/2019   left elbow   CHF (congestive heart failure) (HCC)    DDD (degenerative disc disease), lumbar    Dyspnea 03/30/2019   Insomnia 03/30/2019   Nonischemic cardiomyopathy (HCC)    Nonobstructive CAD    Persistent atrial fibrillation (HCC)    Septic bursitis of elbow, left 04/13/2019   Past Surgical History:  Procedure Laterality Date   ATRIAL FIBRILLATION ABLATION N/A 12/07/2019   Procedure: ATRIAL FIBRILLATION ABLATION;  Surgeon: Kelsie Agent, MD;  Location: [facility] INVASIVE CV LAB;  Service: Cardiovascular;  Laterality: N/A;   CARDIOVERSION N/A 09/27/2019   Procedure: CARDIOVERSION;  Surgeon: Rolan Ezra RAMAN, MD;  Location: [facility] ENDOSCOPY;  Service: Cardiovascular;  Laterality: N/A;   CARDIOVERSION N/A 11/30/2019   Procedure: CARDIOVERSION;  Surgeon: Rolan Ezra RAMAN, MD;  Location: [facility] ENDOSCOPY;  Service: Cardiovascular;  Laterality: N/A;   DENTAL EXAMINATION UNDER ANESTHESIA     RIGHT/LEFT HEART CATH AND CORONARY ANGIOGRAPHY N/A 09/06/2019   Procedure: RIGHT/LEFT HEART CATH AND CORONARY ANGIOGRAPHY;  Surgeon: Rolan Ezra RAMAN, MD;  Location: [facility] INVASIVE CV LAB;  Service: Cardiovascular;  Laterality: N/A;   Family History  Problem Relation Age of Onset   Lung cancer Father    Social History   Socioeconomic History   Marital status: Widowed    Spouse name: Not on file   Number of children: Not on file   Years of education: Not on file   Highest education level: Not on file  Occupational History   Not on file  Tobacco Use   Smoking status: Every Day    Current packs/day: 1.00    Average packs/day: 1 pack/day for 50.0 years (50.0 ttl pk-yrs)    Types: Cigarettes   Smokeless tobacco: Never   Tobacco comments:    trying to cut down  Vaping Use   Vaping status: Never Used  Substance and Sexual Activity   Alcohol use: Not  Currently    Alcohol/week: 2.0 standard drinks of alcohol    Types: 1 Cans of beer, 1 Standard drinks or equivalent per week    Comment: rare   Drug use: Never   Sexual activity: Not on file  Other Topics Concern   Not on file  Social History Narrative   Lives in [place] alone   [place] (self employed)   Social Drivers of Health   Financial Resource Strain: Low Risk  (06/06/2024)   Overall Financial Resource Strain (CARDIA)    Difficulty of Paying Living Expenses: Not hard at all  Food Insecurity: No Food Insecurity (06/06/2024)   Hunger Vital Sign    Worried About Running Out of Food in the Last Year: Never true    Ran Out of Food in the Last Year: Never true  Transportation Needs: No Transportation Needs (06/06/2024)   PRAPARE - Administrator, Civil Service (Medical): No    Lack of Transportation (Non-Medical): No  Physical Activity: Inactive (06/06/2024)   Exercise Vital Sign    Days of Exercise per Week: 0 days    Minutes of Exercise per Session: 0 min  Stress: No Stress Concern Present (06/06/2024)   [employer] of Occupational Health - Occupational Stress Questionnaire    Feeling of Stress: Not at all  Social Connections: Socially Isolated (06/06/2024)   Social Connection and Isolation Panel    Frequency of Communication with Friends and Family: Twice a week    Frequency of Social Gatherings with Friends and Family: Never    Attends Religious Services: Never    Database administrator or Organizations: No    Attends Banker Meetings: Never    Marital Status: Widowed    Tobacco Counseling Ready to quit: No Counseling given: Not Answered Tobacco comments: trying to cut down    Clinical Intake:  Pre-visit preparation completed: Yes  Pain : 0-10 Pain Score: 3  Pain Type: Chronic pain Pain Location: Back Pain Orientation: Lower Pain Descriptors / Indicators: Aching Pain Onset: More than a month ago Pain Frequency: Constant      Nutritional Status: BMI 25 -29 Overweight Nutritional Risks: None Diabetes: No  Lab Results  Component Value Date   HGBA1C 6.0 (H) 05/06/2023   HGBA1C 5.7 (A) 07/27/2022   HGBA1C 5.8 (A) 07/08/2021     How often do you need to have someone help you when you read instructions, pamphlets, or other written materials from your doctor or pharmacy?: 1 - Never  Interpreter Needed?: No  Information entered by :: NAllen LPN   Activities of Daily Living     06/06/2024    2:49 PM  In your present state of health, do you have any  difficulty performing the following activities:  Hearing? 0  Vision? 0  Difficulty concentrating or making decisions? 0  Walking or climbing stairs? 0  Dressing or bathing? 0  Doing errands, shopping? 0  Preparing Food and eating ? N  Using the Toilet? N  In the past six months, have you accidently leaked urine? N  Do you have problems with loss of bowel control? N  Managing your Medications? N  Managing your Finances? N  Housekeeping or managing your Housekeeping? N    Patient Care Team: Early, Sara E, NP as PCP - General (Nurse Practitioner)  I have updated your Care Teams any recent Medical Services you may have received from other providers in the past year.     Assessment:   This is a routine wellness examination for Per.  Hearing/Vision screen Hearing Screening - Comments:: Denies hearing issues Vision Screening - Comments:: No regular eye exams   Goals Addressed             This Visit's Progress    Patient Stated       06/06/2024, denies goals       Depression Screen     06/06/2024    2:59 PM 05/06/2023   10:05 AM 07/27/2022   10:09 AM 07/08/2021   11:25 AM 03/25/2020    2:37 PM 07/11/2019    1:24 PM 04/11/2019    8:58 AM  PHQ 2/9 Scores  PHQ - 2 Score 1 0 0 0 0 1 0  PHQ- 9 Score 3   0  5     Fall Risk     06/06/2024    2:58 PM 05/06/2023   10:05 AM 07/27/2022   10:08 AM 07/08/2021   11:25 AM 03/25/2020    1:29 PM  Fall Risk    Falls in the past year? 0 0 0 0 1   Number falls in past yr: 0 0 0  0  Injury with Fall? 0 0 0  0  Risk for fall due to : Medication side effect No Fall Risks No Fall Risks No Fall Risks   Follow up Falls evaluation completed;Falls prevention discussed Falls evaluation completed Falls evaluation completed;Falls prevention discussed        Data saved with a previous flowsheet row definition    MEDICARE RISK AT HOME:  Medicare Risk at Home Any stairs in or around the home?: Yes If so, are there any without handrails?: No Home free of loose throw rugs in walkways, pet beds, electrical cords, etc?: Yes Adequate lighting in your home to reduce risk of falls?: Yes Life alert?: No Use of a cane, walker or w/c?: No Grab bars in the bathroom?: No Shower chair or bench in shower?: No Elevated toilet seat or a handicapped toilet?: Yes  TIMED UP AND GO:  Was the test performed?  Yes  Length of time to ambulate 10 feet: 5 sec Gait steady and fast without use of assistive device  Cognitive Function: 6CIT completed        06/06/2024    3:00 PM  6CIT Screen  What Year? 0 points  What month? 0 points  What time? 0 points  Count back from 20 0 points  Months in reverse 0 points  Repeat phrase 0 points  Total Score 0 points    Immunizations Immunization History  Administered Date(s) Administered   Tdap 07/11/2019    Screening Tests Health Maintenance  Topic Date Due   Pneumococcal Vaccine: 50+ Years (1 of  2 - PCV) Never done   Colonoscopy  Never done   Lung Cancer Screening  Never done   Zoster Vaccines- Shingrix (1 of 2) Never done   COVID-19 Vaccine (1 - 2024-25 season) Never done   INFLUENZA VACCINE  06/30/2024   Medicare Annual Wellness (AWV)  06/06/2025   DTaP/Tdap/Td (2 - Td or Tdap) 07/10/2029   Hepatitis C Screening  Completed   Hepatitis B Vaccines  Aged Out   HPV VACCINES  Aged Out   Meningococcal B Vaccine  Aged Out    Health Maintenance  Health  Maintenance Due  Topic Date Due   Pneumococcal Vaccine: 50+ Years (1 of 2 - PCV) Never done   Colonoscopy  Never done   Lung Cancer Screening  Never done   Zoster Vaccines- Shingrix (1 of 2) Never done   COVID-19 Vaccine (1 - 2024-25 season) Never done   Health Maintenance Items Addressed: Declines vaccines. Cardiologist told him he did not need colonoscopy. Declines lung cancer screening.  Additional Screening:  Vision Screening: Recommended annual ophthalmology exams for early detection of glaucoma and other disorders of the eye. Would you like a referral to an eye doctor? No    Dental Screening: Recommended annual dental exams for proper oral hygiene  Community Resource Referral / Chronic Care Management: CRR required this visit?  No   CCM required this visit?  No   Plan:    I have personally reviewed and noted the following in the patient's chart:   Medical and social history Use of alcohol, tobacco or illicit drugs  Current medications and supplements including opioid prescriptions. Patient is not currently taking opioid prescriptions. Functional ability and status Nutritional status Physical activity Advanced directives List of other physicians Hospitalizations, surgeries, and ER visits in previous 12 months Vitals Screenings to include cognitive, depression, and falls Referrals and appointments  In addition, I have reviewed and discussed with patient certain preventive protocols, quality metrics, and best practice recommendations. A written personalized care plan for preventive services as well as general preventive health recommendations were provided to patient.   Ardella FORBES Dawn, LPN   12/31/7972   After Visit Summary: (In Person-Printed) AVS printed and given to the patient  Notes: Nothing significant to report at this time.

## 2024-06-07 ENCOUNTER — Other Ambulatory Visit (HOSPITAL_COMMUNITY): Payer: Self-pay | Admitting: Cardiology

## 2024-06-29 ENCOUNTER — Other Ambulatory Visit (HOSPITAL_COMMUNITY): Payer: Self-pay | Admitting: Cardiology

## 2024-06-29 DIAGNOSIS — I5020 Unspecified systolic (congestive) heart failure: Secondary | ICD-10-CM

## 2024-06-29 DIAGNOSIS — I4891 Unspecified atrial fibrillation: Secondary | ICD-10-CM

## 2024-09-04 ENCOUNTER — Other Ambulatory Visit: Payer: Self-pay | Admitting: Nurse Practitioner

## 2024-09-04 DIAGNOSIS — I4819 Other persistent atrial fibrillation: Secondary | ICD-10-CM

## 2024-09-04 DIAGNOSIS — I482 Chronic atrial fibrillation, unspecified: Secondary | ICD-10-CM

## 2024-09-12 NOTE — Telephone Encounter (Signed)
 Copied from CRM [id number]. Topic: Referral - Request for Referral >> Sep 12, 2024  4:18 PM Amy B wrote: Did the patient discuss referral with their provider in the last year? Yes (If No - schedule appointment) (If Yes - send message)  Appointment offered? No  Type of order/referral and detailed reason for visit: patient requests referral to neurosurgery  Preference of office, provider, location: [facility]  If referral order, have you been seen by this specialty before? No (If Yes, this issue or another issue? When? Where?  Can we respond through MyChart? No

## 2024-09-20 NOTE — Progress Notes (Signed)
 PCP: Oris Camie BRAVO, NP Cardiology: Dr. Rolan  Chief complaint: CHF  73 y.o. with history of HTN and smoking was recently diagnosed with atrial fibrillation and chronic systolic CHF.  Patient was admitted in 4/20 with left elbow septic bursitis.  Several weeks prior to the admission, he had been feeling palpitations and had been tiring more easily.  At the time of admission, he reported chest pressure.  He was found to be in atrial fibrillation with RVR.  Echo was done, showing EF depressed to 30-35%.  He was started on Coreg  and it was titrated up to control his HR.  He was started on Eliquis .  He was not cardioverted.   In 10/20, he had LHC/RHC showing moderate nonobstructive CAD, elevated PCWP, and CI 1.85.  He was started on digoxin .  Three weeks later after restarting apixaban  he had had successful DCCV to NSR later in 10/20.   He had recurrent atrial fibrillation and failed DCCV in 12/20.  He then had atrial fibrillation ablation in 1/21.  He is now off amiodarone .   Cardiac MRI (1/21) with LV EF 47%, RV EF 46%, basal inferolateral and apical lateral LGE, looks like coronary disease pattern.   Echo in 9/21 showed EF improved to 55-60%, normal RV.  Echo 5/23 showed EF 55% with normal RV, mild biatrial enlargement.      Echo 8/24 EF 60-65%, normal RV  Today he returns for HF follow up. Still smoking, never tried Chantix .  Main complaint is low back pain, seeing Dr. Bonner.  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  Weight down 15 lbs since most of his teeth were pulled. Getting dentures.  He is in NSR today, no palpitations.  He fatigues walking longer distances, thinks this is due to back pain.  No significant dyspnea or chest pain.   ECG (personally reviewed): NSR, iRBBB  Labs (5/20): LDL 65 Labs (8/20): K 4.5, creatinine 1.1, hgb 13.1 Labs (11/20): K 4, creatinine 1.08, hgb 15, digoxin  0.7 Labs (12/20): LDL 56 Labs (1/21): K 4.3, creatinine 1.2 Labs  (2/21): K 4.4, creatinine 1.19 Labs (7/21): K 4.9, creatinine 1.69 Labs (10/21): K 4.6, creatinine 1.69 Labs (4/22): K 4.7, creatinine 1.47 Labs (10/22): K 4.3, creatinine 1.49 Labs (5/23): K 4.3, creatinine 1.34 Labs (8/23): LDL 46, HDL 32 Labs (6/24): K 4.9, creatinine 1.5, hgb 12.5 Labs (8/24): K 4.1, creatinine 1.42, LDL 41 Labs (12/24): K 4.1, creatinine 1.38  PMH: 1. HTN 2. Atrial fibrillation: Paroxysmal. - DCCV to NSR in 10/20.  - Atrial fibrillation ablation 1/21.  3. Chronic systolic CHF: Nonischemic cardiomyopathy.  echo (4/20) with EF 30-35%, mildly decreased RV systolic function.  - LHC/RHC (10/20): 60% mid LAD, 40% PLOM, 50% dRCA; mean RA 2, PA 45/20, mean PCWP 22, CI 1.85, PVR 2.3 WU.  - Cardiac MRI (1/21): LV EF 47%, RV EF 46%, basal inferolateral and apical lateral LGE, looks like coronary disease pattern.  - Echo (9/21): EF 55-60% with normal RV.  - Echo (5/23): EF 55% with normal RV, mild biatrial enlargement.  4. H/o septic bursitis 5. CAD: LHC in 10/20 with moderate nonobstructive disease (see above).  6. CKD stage 3  SH: Works as Music therapist, widowed, smokes 1 ppd. Lives in [place].   FH: Father with lung cancer.  Brother with CABG in his 50s.   ROS: All systems reviewed and negative except as per HPI.   Current Outpatient Medications  Medication Sig Dispense Refill  apixaban  (ELIQUIS ) 5 MG TABS tablet TAKE 1 TABLET BY MOUTH TWICE A DAY 180 tablet 0   carvedilol  (COREG ) 6.25 MG tablet TAKE 1 TABLET BY MOUTH TWICE A DAY WITH FOOD 180 tablet 3   ENTRESTO  24-26 MG TAKE 1 TABLET BY MOUTH TWICE A DAY 60 tablet 11   ibuprofen (ADVIL) 200 MG tablet Take 800 mg by mouth every 6 (six) hours as needed.     rosuvastatin  (CRESTOR ) 10 MG tablet TAKE 1 TABLET BY MOUTH EVERY DAY 90 tablet 3   No current facility-administered medications for this visit.   Wt Readings from Last 3 Encounters:  06/06/24 100.3 kg (221 lb 3.2 oz)  03/24/24 99.9 kg (220 lb 3.2 oz)   03/23/24 99.7 kg (219 lb 12.8 oz)   There were no vitals taken for this visit. General: NAD Neck: No JVD, no thyromegaly or thyroid  nodule.  Lungs: Distant breath sounds.  CV: Nondisplaced PMI.  Heart regular S1/S2, no S3/S4, no murmur.  No peripheral edema.  No carotid bruit.  Normal pedal pulses.  Abdomen: Soft, nontender, no hepatosplenomegaly, no distention.  Skin: Intact without lesions or rashes.  Neurologic: Alert and oriented x 3.  Psych: Normal affect. Extremities: No clubbing or cyanosis.  HEENT: Normal.   Assessment/Plan: 1. Chronic systolic CHF: Echo in 4/20 with EF 30-35%, mildly decreased RV systolic function.  Nonischemic cardiomyopathy, RHC/LHC in 10/20 showed nonobstructive CAD and low output with CI 1.85.  It is possible that this is a tachycardia-mediated cardiomyopathy with EF down because of atrial fibrillation.  It is also possible that the cardiomyopathy is from some other cause and that the cardiomyopathy itself predisposed him to atrial fibrillation.  Cardiac MRI in 1/21 showed some improvement, with LV EF 47% and normal RV, there was coronary pattern LGE in the inferolateral wall.  Echo in 9/21 showed EF up to 55-60% with normal RV, echo in 5/23 similarly showed EF 55% with normal RV, mild biatrial enlargement.  Echo 8/24 EF 60-65%, normal RV. NYHA class II, not volume overloaded on exam.   - Continue Coreg  6.25 mg bid, dose decreased in the past due to bradycardia.   - Continue Entresto  24/26 bid. BMET today. - He did not tolerate Farxiga  due to itching.  - He is off spironolactone .  With improved EF, I think he can stay off (but would continue Entresto  and Coreg ).   2. Atrial fibrillation: Paroxysmal.  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  He had DCCV to NSR in 10/20 but afib recurred.  He had atrial fibrillation ablation in 1/21.  He is in sinus today. Amiodarone  was stopped post-ablation.   - Continue Eliquis  5 mg bid. CBC today.   3. Smoking: I strongly recommended that he quit.   - He has not wanted to try Chantix .  4. HTN: BP controlled today. - Continue GDMT as above. 5. CAD: Moderate nonobstructive CAD on 10/20 cath. No chest pain. - No ASA given apixaban  use.  - Continue Crestor  10 mg daily, check lipids today.   Follow up in 6 months with APP  I spent 21 minutes reviewing records, interviewing/examining patient, and managing orders.    Harlene HERO [facility]  09/20/2024

## 2024-09-21 ENCOUNTER — Telehealth (HOSPITAL_COMMUNITY): Payer: Self-pay

## 2024-09-21 NOTE — Telephone Encounter (Signed)
 Called to confirm/remind patient of their appointment at the [facility] on 09/22/24.   Appointment:   [x] Confirmed  [] Left mess   [] No answer/No voice mail  [] VM Full/unable to leave message  [] Phone not in service  Patient reminded to bring all medications and/or complete list.  Confirmed patient has transportation. Gave directions, instructed to utilize valet parking.

## 2024-09-22 ENCOUNTER — Encounter (HOSPITAL_COMMUNITY): Payer: Self-pay

## 2024-09-22 ENCOUNTER — Ambulatory Visit (HOSPITAL_COMMUNITY)
Admission: RE | Admit: 2024-09-22 | Discharge: 2024-09-22 | Disposition: A | Discharge status: Home / Self Care | Source: Ambulatory Visit | Attending: Family Medicine | Admitting: Family Medicine

## 2024-09-22 ENCOUNTER — Ambulatory Visit (HOSPITAL_COMMUNITY): Payer: Self-pay | Admitting: Family Medicine

## 2024-09-22 VITALS — BP 118/82 | HR 58 | Ht 74.0 in | Wt 221.8 lb

## 2024-09-22 DIAGNOSIS — I10 Essential (primary) hypertension: Secondary | ICD-10-CM

## 2024-09-22 DIAGNOSIS — I2510 Atherosclerotic heart disease of native coronary artery without angina pectoris: Secondary | ICD-10-CM | POA: Insufficient documentation

## 2024-09-22 DIAGNOSIS — I110 Hypertensive heart disease with heart failure: Secondary | ICD-10-CM | POA: Diagnosis present

## 2024-09-22 DIAGNOSIS — I4519 Other right bundle-branch block: Secondary | ICD-10-CM | POA: Diagnosis not present

## 2024-09-22 DIAGNOSIS — F17200 Nicotine dependence, unspecified, uncomplicated: Secondary | ICD-10-CM | POA: Diagnosis not present

## 2024-09-22 DIAGNOSIS — Z7901 Long term (current) use of anticoagulants: Secondary | ICD-10-CM | POA: Diagnosis not present

## 2024-09-22 DIAGNOSIS — I5022 Chronic systolic (congestive) heart failure: Secondary | ICD-10-CM | POA: Diagnosis not present

## 2024-09-22 DIAGNOSIS — I428 Other cardiomyopathies: Secondary | ICD-10-CM | POA: Diagnosis not present

## 2024-09-22 DIAGNOSIS — F17210 Nicotine dependence, cigarettes, uncomplicated: Secondary | ICD-10-CM | POA: Insufficient documentation

## 2024-09-22 DIAGNOSIS — I480 Paroxysmal atrial fibrillation: Secondary | ICD-10-CM | POA: Insufficient documentation

## 2024-09-22 DIAGNOSIS — Z79899 Other long term (current) drug therapy: Secondary | ICD-10-CM | POA: Insufficient documentation

## 2024-09-22 DIAGNOSIS — I4891 Unspecified atrial fibrillation: Secondary | ICD-10-CM | POA: Diagnosis present

## 2024-09-22 LAB — BASIC METABOLIC PANEL WITH GFR
Anion gap: 9 (ref 5–15)
BUN: 14 mg/dL (ref 8–23)
CO2: 26 mmol/L (ref 22–32)
Calcium: 8.7 mg/dL — ABNORMAL LOW (ref 8.9–10.3)
Chloride: 104 mmol/L (ref 98–111)
Creatinine, Ser: 1.41 mg/dL — ABNORMAL HIGH (ref 0.61–1.24)
GFR, Estimated: 53 mL/min — ABNORMAL LOW (ref >60)
Glucose, Bld: 107 mg/dL — ABNORMAL HIGH (ref 70–99)
Potassium: 4 mmol/L (ref 3.5–5.1)
Sodium: 139 mmol/L (ref 135–145)

## 2024-09-22 MED ORDER — APIXABAN 5 MG PO TABS: 5.0000 mg | ORAL_TABLET | Freq: Two times a day (BID) | ORAL | 0 refills | Status: AC

## 2024-09-22 NOTE — Patient Instructions (Signed)
 Good to see you today!  RESTART Eliquis  5 mg Twice daily  Your physician has requested that you have an echocardiogram. Echocardiography is a painless test that uses sound waves to create images of your heart. It provides your doctor with information about the size and shape of your heart and how well your heart's chambers and valves are working. This procedure takes approximately one hour. There are no restrictions for this procedure. Please do NOT wear cologne, perfume, aftershave, or lotions (deodorant is allowed). Please arrive 15 minutes prior to your appointment time.  Please note: We ask at that you not bring children with you during ultrasound (echo/ vascular) testing. Due to room size and safety concerns, children are not allowed in the ultrasound rooms during exams. Our front office staff cannot provide observation of children in our lobby area while testing is being conducted. An adult accompanying a patient to their appointment will only be allowed in the ultrasound room at the discretion of the ultrasound technician under special circumstances. We apologize for any inconvenience.  Your physician recommends that you schedule a follow-up appointment in: 6 months( April ) Call office in February to schedule an appointment  If you have any questions or concerns before your next appointment please send us  a message through [place] or call our office at [phone].    TO LEAVE A MESSAGE FOR THE NURSE SELECT OPTION 2, PLEASE LEAVE A MESSAGE INCLUDING: YOUR NAME DATE OF BIRTH CALL BACK NUMBER REASON FOR CALL**this is important as we prioritize the call backs  YOU WILL RECEIVE A CALL BACK THE SAME DAY AS LONG AS YOU CALL BEFORE 4:00 PM At the [facility], you and your health needs are our priority. As part of our continuing mission to provide you with exceptional heart care, we have created designated Provider Care Teams. These Care Teams include your primary Cardiologist  (physician) and Advanced Practice Providers (APPs- Physician Assistants and Nurse Practitioners) who all work together to provide you with the care you need, when you need it.   You may see any of the following providers on your designated Care Team at your next follow up: Dr Toribio Fuel Dr Ezra Shuck Dr. Ria Commander Dr. Morene Brownie Amy Lenetta, NP Caffie Shed, [place] [facility] [place], [place] Beckey Coe, NP [place] Lee, NP Ellouise Class, NP Tinnie Redman, PharmD Jaun Bash, PharmD   Please be sure to bring in all your medications bottles to every appointment.    Thank you for choosing [facility]

## 2024-09-26 ENCOUNTER — Telehealth: Payer: Self-pay | Admitting: Nurse Practitioner

## 2024-09-26 NOTE — Telephone Encounter (Unsigned)
 Copied from CRM [id number]. Topic: Referral - Question >> Sep 26, 2024  2:47 PM Sean Barnett wrote: Reason for CRM: Patient called in wanting to know or follow up on a referral for his back pain with Dr. Nicki. Stated him and Camie Doing did discuss. Please call [phone]

## 2024-09-29 NOTE — Telephone Encounter (Unsigned)
 Copied from CRM #[id number]. Topic: Referral - Request for Referral >> Sep 29, 2024 12:29 PM Selinda RAMAN wrote: Did the patient discuss referral with their provider in the last year? Yes as far as his back pain but not specifically to Dr Victory Gens. He saw Dr Bonner at [facility] and had injections which helped initially but then it only got worse   Appointment offered? Yes  Type of order/referral and detailed reason for visit: Referral due to back pian, difficulty walking and sleeping  Preference of office, provider, location: [facility] with Dr Victory Gens  If referral order, have you been seen by this specialty before? No (If Yes, this issue or another issue? When? Where?  Can we respond through MyChart? No  Please assist patient further with referral and if he has to be seen please call him to set up appointment as soon as possible.

## 2024-10-02 ENCOUNTER — Other Ambulatory Visit: Payer: Self-pay

## 2024-10-02 DIAGNOSIS — M545 Low back pain, unspecified: Secondary | ICD-10-CM

## 2024-10-02 DIAGNOSIS — M546 Pain in thoracic spine: Secondary | ICD-10-CM

## 2024-10-13 ENCOUNTER — Ambulatory Visit (HOSPITAL_COMMUNITY)
Admission: RE | Admit: 2024-10-13 | Discharge: 2024-10-13 | Disposition: A | Discharge status: Home / Self Care | Source: Ambulatory Visit | Attending: Cardiology | Admitting: Cardiology

## 2024-10-13 DIAGNOSIS — I429 Cardiomyopathy, unspecified: Secondary | ICD-10-CM | POA: Diagnosis not present

## 2024-10-13 DIAGNOSIS — I5022 Chronic systolic (congestive) heart failure: Secondary | ICD-10-CM | POA: Diagnosis present

## 2024-10-13 DIAGNOSIS — I77810 Thoracic aortic ectasia: Secondary | ICD-10-CM | POA: Insufficient documentation

## 2024-10-13 DIAGNOSIS — I110 Hypertensive heart disease with heart failure: Secondary | ICD-10-CM | POA: Insufficient documentation

## 2024-10-13 DIAGNOSIS — F17210 Nicotine dependence, cigarettes, uncomplicated: Secondary | ICD-10-CM | POA: Diagnosis not present

## 2024-10-13 LAB — ECHOCARDIOGRAM COMPLETE
Area-P 1/2: 2.48 cm2
S' Lateral: 3.1 cm

## 2024-10-13 NOTE — Progress Notes (Signed)
  Echocardiogram 2D Echocardiogram has been performed.  Sean Barnett 10/13/2024, 10:36 AM

## 2025-06-12 ENCOUNTER — Ambulatory Visit: Payer: Self-pay
